# Patient Record
Sex: Female | Born: 1965 | Race: White | Hispanic: No | Smoking: Never smoker
Health system: Southern US, Community
[De-identification: ages and names within clinical notes are randomized; demographics above are authoritative.]

## PROBLEM LIST (undated history)

## (undated) DIAGNOSIS — F419 Anxiety disorder, unspecified: Secondary | ICD-10-CM

## (undated) DIAGNOSIS — F32A Depression, unspecified: Secondary | ICD-10-CM

## (undated) DIAGNOSIS — K219 Gastro-esophageal reflux disease without esophagitis: Secondary | ICD-10-CM

## (undated) DIAGNOSIS — Z9889 Other specified postprocedural states: Secondary | ICD-10-CM

## (undated) DIAGNOSIS — R51 Headache: Secondary | ICD-10-CM

## (undated) DIAGNOSIS — R112 Nausea with vomiting, unspecified: Secondary | ICD-10-CM

## (undated) DIAGNOSIS — M4810 Ankylosing hyperostosis [Forestier], site unspecified: Secondary | ICD-10-CM

## (undated) DIAGNOSIS — I10 Essential (primary) hypertension: Secondary | ICD-10-CM

## (undated) DIAGNOSIS — H04123 Dry eye syndrome of bilateral lacrimal glands: Secondary | ICD-10-CM

## (undated) DIAGNOSIS — M542 Cervicalgia: Secondary | ICD-10-CM

## (undated) DIAGNOSIS — Z8619 Personal history of other infectious and parasitic diseases: Secondary | ICD-10-CM

## (undated) DIAGNOSIS — M1990 Unspecified osteoarthritis, unspecified site: Secondary | ICD-10-CM

## (undated) DIAGNOSIS — M4802 Spinal stenosis, cervical region: Secondary | ICD-10-CM

## (undated) DIAGNOSIS — F329 Major depressive disorder, single episode, unspecified: Secondary | ICD-10-CM

## (undated) DIAGNOSIS — N3941 Urge incontinence: Secondary | ICD-10-CM

## (undated) DIAGNOSIS — R011 Cardiac murmur, unspecified: Secondary | ICD-10-CM

## (undated) DIAGNOSIS — J189 Pneumonia, unspecified organism: Secondary | ICD-10-CM

## (undated) DIAGNOSIS — S1121XA Laceration without foreign body of pharynx and cervical esophagus, initial encounter: Secondary | ICD-10-CM

## (undated) HISTORY — DX: Urge incontinence: N39.41

## (undated) HISTORY — DX: Personal history of other infectious and parasitic diseases: Z86.19

## (undated) HISTORY — DX: Laceration without foreign body of pharynx and cervical esophagus, initial encounter: S11.21XA

## (undated) HISTORY — DX: Ankylosing hyperostosis (forestier), site unspecified: M48.10

## (undated) HISTORY — DX: Spinal stenosis, cervical region: M48.02

## (undated) HISTORY — DX: Pneumonia, unspecified organism: J18.9

---

## 2000-06-16 ENCOUNTER — Other Ambulatory Visit: Admission: RE | Admit: 2000-06-16 | Discharge: 2000-06-16 | Payer: Self-pay | Admitting: Obstetrics and Gynecology

## 2001-04-20 ENCOUNTER — Ambulatory Visit (HOSPITAL_COMMUNITY): Admission: RE | Admit: 2001-04-20 | Discharge: 2001-04-20 | Payer: Self-pay | Admitting: Obstetrics and Gynecology

## 2001-04-20 ENCOUNTER — Encounter: Payer: Self-pay | Admitting: Obstetrics and Gynecology

## 2001-08-31 ENCOUNTER — Encounter (HOSPITAL_COMMUNITY): Admission: AD | Admit: 2001-08-31 | Discharge: 2001-09-07 | Payer: Self-pay | Admitting: Obstetrics and Gynecology

## 2001-09-07 ENCOUNTER — Inpatient Hospital Stay (HOSPITAL_COMMUNITY): Admission: AD | Admit: 2001-09-07 | Discharge: 2001-09-09 | Payer: Self-pay | Admitting: Obstetrics and Gynecology

## 2009-04-25 HISTORY — PX: ABDOMINAL HYSTERECTOMY: SHX81

## 2012-04-25 DIAGNOSIS — R112 Nausea with vomiting, unspecified: Secondary | ICD-10-CM

## 2012-04-25 DIAGNOSIS — Z9889 Other specified postprocedural states: Secondary | ICD-10-CM

## 2012-04-25 HISTORY — DX: Other specified postprocedural states: Z98.890

## 2012-04-25 HISTORY — DX: Other specified postprocedural states: R11.2

## 2012-08-13 ENCOUNTER — Other Ambulatory Visit (HOSPITAL_COMMUNITY): Payer: Self-pay | Admitting: Specialist

## 2012-08-16 ENCOUNTER — Encounter (HOSPITAL_COMMUNITY): Payer: Self-pay | Admitting: Pharmacy Technician

## 2012-08-17 NOTE — H&P (Signed)
Carolyn [name] is an 47 y.o. female.   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 HPI: Pt with increasing difficulty with stiffness of neck over the past few years.  Recently developed dysphagia . Pt was evaluated by her PCP and an ENT physician for her dysphagia which is worsening.  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  She has limited range of motion of the neck.  Denies upper extremity weakness, numbness or tingling.  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  Radiographs show straightening of the cervical spine with good maintenance of the disc space.  Large bridging coalescing syndesmophytes extending from the anteroinferior aspect of C2 bridging to C3, bridging to C4, bridging to C5 and then bridging to C6.  The most prominent spurs appear to be at C2-3 where there is impingement on the retropharyngeal space.     Past Medical History  Diagnosis Date  . Hypertension     takes Hyzaar daily  . Neck pain     bone spur on esophagus  . Headache     MRI in 2007 bc of headaches  . Arthritis   . Urinary frequency   . Heart murmur     New diagnosis  . Dry eyes     uses Restasis bid  . Depression     but doesn't require any meds    Past Surgical History  Procedure Laterality Date  . Abdominal hysterectomy  2011    History reviewed. No pertinent family history. Social History:  reports that she has never smoked. She does not have any smokeless tobacco history on file. She reports that  drinks alcohol. She reports that she does not use illicit drugs.  Allergies: No Known Allergies  Medications Prior to Admission  Medication Sig Dispense Refill  . cycloSPORINE (RESTASIS) 0.05 % ophthalmic emulsion Place 1 drop into both eyes 2 (two) times daily.      [name] losartan-hydrochlorothiazide  (HYZAAR) 100-25 MG per tablet Take 1 tablet by mouth daily.        Results for orders placed during the hospital encounter of 08/20/12 (from the past 48 hour(s))  SURGICAL PCR SCREEN     Status: Abnormal   Collection Time    08/20/12  8:17 AM      Result Value Range   MRSA, PCR NEGATIVE  NEGATIVE   Staphylococcus aureus POSITIVE (*) NEGATIVE   Comment:            The Xpert SA Assay (FDA     approved for NASAL specimens     in patients over 24 years of age),     is one component of     a comprehensive surveillance     program.  Test performance has     been validated by [employer] for patients greater     than or equal to 91 year old.     It is not intended     to diagnose infection nor to     guide or monitor treatment.  URINALYSIS, ROUTINE W REFLEX MICROSCOPIC     Status: None   Collection Time    08/20/12  8:18 AM      Result Value Range   Color, Urine YELLOW  YELLOW   APPearance CLEAR  CLEAR  Specific Gravity, Urine 1.021  1.005 - 1.030   pH 6.5  5.0 - 8.0   Glucose, UA NEGATIVE  NEGATIVE mg/dL   Hgb urine dipstick NEGATIVE  NEGATIVE   Bilirubin Urine NEGATIVE  NEGATIVE   Ketones, ur NEGATIVE  NEGATIVE mg/dL   Protein, ur NEGATIVE  NEGATIVE mg/dL   Urobilinogen, UA 0.2  0.0 - 1.0 mg/dL   Nitrite NEGATIVE  NEGATIVE   Leukocytes, UA NEGATIVE  NEGATIVE   Comment: MICROSCOPIC NOT DONE ON URINES WITH NEGATIVE PROTEIN, BLOOD, LEUKOCYTES, NITRITE, OR GLUCOSE <1000 mg/dL.  CBC     Status: None   Collection Time    08/20/12  8:19 AM      Result Value Range   WBC 9.2  4.0 - 10.5 K/uL   RBC 4.54  3.87 - 5.11 MIL/uL   Hemoglobin 13.7  12.0 - 15.0 g/dL   HCT 45.4  09.8 - 11.9 %   MCV 84.1  78.0 - 100.0 fL   MCH 30.2  26.0 - 34.0 pg   MCHC 35.9  30.0 - 36.0 g/dL   RDW 14.7  82.9 - 56.2 %   Platelets 230  150 - 400 K/uL  COMPREHENSIVE METABOLIC PANEL     Status: Abnormal   Collection Time    08/20/12  8:19 AM      Result Value Range   Sodium 138  135 - 145 mEq/L    Potassium 3.3 (*) 3.5 - 5.1 mEq/L   Chloride 99  96 - 112 mEq/L   CO2 30  19 - 32 mEq/L   Glucose, Bld 98  70 - 99 mg/dL   BUN 17  6 - 23 mg/dL   Creatinine, Ser 1.30  0.50 - 1.10 mg/dL   Calcium 9.7  8.4 - 86.5 mg/dL   Total Protein 7.8  6.0 - 8.3 g/dL   Albumin 4.4  3.5 - 5.2 g/dL   AST 14  0 - 37 U/L   ALT 14  0 - 35 U/L   Alkaline Phosphatase 64  39 - 117 U/L   Total Bilirubin 0.7  0.3 - 1.2 mg/dL   GFR calc non Af Amer >90  >90 mL/min   GFR calc Af Amer >90  >90 mL/min   Comment:            The eGFR has been calculated     using the CKD EPI equation.     This calculation has not been     validated in all clinical     situations.     eGFR's persistently     <90 mL/min signify     possible Chronic Kidney Disease.   Dg Chest 2 View  08/20/2012  *RADIOLOGY REPORT*  Clinical Data: Hypertension  CHEST - 2 VIEW  Comparison: None.  Findings: Cardiomediastinal silhouette appears normal.  No acute pulmonary disease is noted.  Bony thorax is intact.  IMPRESSION: No acute cardiopulmonary abnormality seen.   Original Report Authenticated By: Lupita Raider.,  M.D.     Review of Systems  Constitutional: Negative.   HENT: Positive for neck pain.   Eyes: Negative.   Respiratory: Negative.   Cardiovascular: Negative.   Gastrointestinal:       Dysphagia  Genitourinary: Negative.   Musculoskeletal:       Stiffness of neck and very limited range of motion  Skin: Negative.   Neurological: Negative.   Endo/Heme/Allergies: Negative.   Psychiatric/Behavioral: Negative.     Blood pressure 132/74, pulse 62,  temperature 98.2 F (36.8 C), temperature source Oral, resp. rate 20, SpO2 100.00%. Physical Exam  Constitutional: She is oriented to person, place, and time. She appears well-developed and well-nourished.  HENT:  Head: Normocephalic and atraumatic.  Eyes: EOM are normal. Pupils are equal, round, and reactive to light.  Neck:  Decreased ROM of neck with lateral bending and  rotation diminished by 60%, extension decreased to 30% of normal.    Cardiovascular: Normal rate, regular rhythm and intact distal pulses.   Murmur heard. Grade II/IV systolic murmur heard at left sternal border.  No radiation into neck .  No bruit of carotid arteries.  Respiratory: Effort normal and breath sounds normal.  GI: Soft. Bowel sounds are normal.  Musculoskeletal:  Slight weakness of shoulder abduction left > right and no other focal weakness of upper or lower extremities.  Neurological: She is alert and oriented to person, place, and time. She has normal reflexes.  Skin: Skin is warm and dry.  Psychiatric: She has a normal mood and affect.     Assessment/Plan Exostosis of Cervical spine, C2-C5 with dysphagia  PLAN:  Excision of exostosis C2-3, C3-4 and C4-5 through anterior cervical approach. Patient was seen and examined in the preop holding area. There has been no interval  Change in this patient's exam preop  history and physical exam  Lab tests and images have been examined and reviewed.  The Risks benefits and alternative treatments have been discussed  extensively,questions answered.  The patient has elected to undergo the discussed surgical treatment. VERNON,SHEILA M 08/21/2012, 9:58 AM

## 2012-08-17 NOTE — Pre-Procedure Instructions (Signed)
Carolyn [name]  08/17/2012   Your procedure is scheduled on:  Tues, April 29 @ 11:00 AM  Report to [facility] at 9:00 AM.  Call this number if you have problems the morning of surgery: [phone]   Remember:   Do not eat food or drink liquids after midnight.   Take these medicines the morning of surgery with A SIP OF WATER: NONE  Restasis (eye drop)   Do not wear jewelry, make-up or nail polish.  Do not wear lotions, powders, or perfumes.   Do not shave 48 hours prior to surgery.   Do not bring valuables to the hospital.  Contacts, dentures or bridgework may not be worn into surgery.  Leave suitcase in the car. After surgery it may be brought to your room.  For patients admitted to the hospital, checkout time is 11:00 AM the day of discharge.   Patients discharged the day of surgery will not be allowed to drive home.  Name and phone number of your driver: Family/Friend  Special Instructions: Please shower the night before your surgery 08/20/12 and the morning of your surgery 08/21/12. You should use approximately 1/3 of the bottle with each shower.    Please read over the following fact sheets that you were given: Pain Booklet, Coughing and Deep Breathing, MRSA Information and Surgical Site Infection Prevention

## 2012-08-20 ENCOUNTER — Encounter (HOSPITAL_COMMUNITY): Payer: Self-pay

## 2012-08-20 ENCOUNTER — Encounter (HOSPITAL_COMMUNITY)
Admission: RE | Admit: 2012-08-20 | Discharge: 2012-08-20 | Disposition: A | Discharge status: Home / Self Care | Payer: BC Managed Care – PPO | Source: Ambulatory Visit | Attending: Specialist | Admitting: Specialist

## 2012-08-20 HISTORY — DX: Depression, unspecified: F32.A

## 2012-08-20 HISTORY — DX: Major depressive disorder, single episode, unspecified: F32.9

## 2012-08-20 HISTORY — DX: Headache: R51

## 2012-08-20 HISTORY — DX: Cardiac murmur, unspecified: R01.1

## 2012-08-20 HISTORY — DX: Essential (primary) hypertension: I10

## 2012-08-20 HISTORY — DX: Unspecified osteoarthritis, unspecified site: M19.90

## 2012-08-20 HISTORY — DX: Dry eye syndrome of bilateral lacrimal glands: H04.123

## 2012-08-20 HISTORY — DX: Cervicalgia: M54.2

## 2012-08-20 LAB — COMPREHENSIVE METABOLIC PANEL
Albumin: 4.4 g/dL (ref 3.5–5.2)
BUN: 17 mg/dL (ref 6–23)
Calcium: 9.7 mg/dL (ref 8.4–10.5)
Chloride: 99 mEq/L (ref 96–112)
Creatinine, Ser: 0.6 mg/dL (ref 0.50–1.10)
Total Bilirubin: 0.7 mg/dL (ref 0.3–1.2)

## 2012-08-20 LAB — CBC
HCT: 38.2 % (ref 36.0–46.0)
MCH: 30.2 pg (ref 26.0–34.0)
MCHC: 35.9 g/dL (ref 30.0–36.0)
MCV: 84.1 fL (ref 78.0–100.0)
RDW: 13.1 % (ref 11.5–15.5)
WBC: 9.2 10*3/uL (ref 4.0–10.5)

## 2012-08-20 LAB — URINALYSIS, ROUTINE W REFLEX MICROSCOPIC
Glucose, UA: NEGATIVE mg/dL
Leukocytes, UA: NEGATIVE
Protein, ur: NEGATIVE mg/dL
Specific Gravity, Urine: 1.021 (ref 1.005–1.030)
Urobilinogen, UA: 0.2 mg/dL (ref 0.0–1.0)

## 2012-08-20 MED ORDER — CHLORHEXIDINE GLUCONATE 4 % EX LIQD: 60.0000 mL | Freq: Once | CUTANEOUS | Status: DC

## 2012-08-20 MED ORDER — CEFAZOLIN SODIUM-DEXTROSE 2-3 GM-% IV SOLR
2.0000 g | INTRAVENOUS | Status: AC
  Administered 2012-08-21: 1 g via INTRAVENOUS
  Administered 2012-08-21: 2 g via INTRAVENOUS
  Filled 2012-08-20: qty 50

## 2012-08-20 NOTE — Progress Notes (Signed)
Pt doesn't have a cardiologist  Denies ever having an echo/stress test/heart cath  Medical Md is Dr.Kevin Howard  EKG report in chart  Denies CXR in past yr  Dr.Nitka's office heard heart murmur on Mon and pressure was elevated-sent to medical MD;HCTZ was added to Losartan on Wed and Medical Md states that heart murmur was not noted in Nov at routine physical.Was told needs to have echo but couldn't get in before surgery

## 2012-08-20 NOTE — Progress Notes (Signed)
Spoke with Dr.Smith about pt new diagnosis of heart murmur and not able to get echo in prior to surgery;per Dr.Smith no new orders just have pt to follow up on echo being done after surgery

## 2012-08-21 ENCOUNTER — Inpatient Hospital Stay (HOSPITAL_COMMUNITY)
Admission: RE | Admit: 2012-08-21 | Discharge: 2012-08-22 | DRG: 867 | Disposition: A | Discharge status: Home / Self Care | Payer: BC Managed Care – PPO | Source: Ambulatory Visit | Attending: Specialist | Admitting: Specialist

## 2012-08-21 ENCOUNTER — Ambulatory Visit (HOSPITAL_COMMUNITY): Payer: BC Managed Care – PPO

## 2012-08-21 ENCOUNTER — Ambulatory Visit (HOSPITAL_COMMUNITY): Payer: BC Managed Care – PPO | Admitting: Anesthesiology

## 2012-08-21 ENCOUNTER — Encounter (HOSPITAL_COMMUNITY)
Admission: RE | Disposition: A | Discharge status: Home / Self Care | Payer: Self-pay | Source: Ambulatory Visit | Attending: Specialist

## 2012-08-21 ENCOUNTER — Encounter (HOSPITAL_COMMUNITY): Payer: Self-pay | Admitting: *Deleted

## 2012-08-21 ENCOUNTER — Encounter (HOSPITAL_COMMUNITY): Payer: Self-pay | Admitting: Anesthesiology

## 2012-08-21 DIAGNOSIS — F329 Major depressive disorder, single episode, unspecified: Secondary | ICD-10-CM | POA: Diagnosis present

## 2012-08-21 DIAGNOSIS — F3289 Other specified depressive episodes: Secondary | ICD-10-CM | POA: Diagnosis present

## 2012-08-21 DIAGNOSIS — R011 Cardiac murmur, unspecified: Secondary | ICD-10-CM | POA: Diagnosis present

## 2012-08-21 DIAGNOSIS — M47812 Spondylosis without myelopathy or radiculopathy, cervical region: Secondary | ICD-10-CM

## 2012-08-21 DIAGNOSIS — I10 Essential (primary) hypertension: Secondary | ICD-10-CM | POA: Diagnosis present

## 2012-08-21 DIAGNOSIS — M4810 Ankylosing hyperostosis [Forestier], site unspecified: Secondary | ICD-10-CM | POA: Diagnosis present

## 2012-08-21 DIAGNOSIS — R51 Headache: Secondary | ICD-10-CM | POA: Diagnosis present

## 2012-08-21 DIAGNOSIS — R1319 Other dysphagia: Secondary | ICD-10-CM | POA: Diagnosis present

## 2012-08-21 DIAGNOSIS — M5380 Other specified dorsopathies, site unspecified: Principal | ICD-10-CM | POA: Diagnosis present

## 2012-08-21 HISTORY — PX: ANTERIOR CERVICAL DECOMP/DISCECTOMY FUSION: SHX1161

## 2012-08-21 SURGERY — ANTERIOR CERVICAL DECOMPRESSION/DISCECTOMY FUSION 1 LEVEL
Anesthesia: General | Site: Neck | Wound class: Clean

## 2012-08-21 MED ORDER — KCL IN DEXTROSE-NACL 20-5-0.45 MEQ/L-%-% IV SOLN
INTRAVENOUS | Status: DC
  Administered 2012-08-21: 19:00:00 via INTRAVENOUS
  Filled 2012-08-21 (×3): qty 1000

## 2012-08-21 MED ORDER — HYDROCHLOROTHIAZIDE 25 MG PO TABS
25.0000 mg | ORAL_TABLET | Freq: Every day | ORAL | Status: DC
  Filled 2012-08-21: qty 1

## 2012-08-21 MED ORDER — ZOLPIDEM TARTRATE 5 MG PO TABS: 5.0000 mg | ORAL_TABLET | Freq: Every evening | ORAL | Status: DC | PRN

## 2012-08-21 MED ORDER — ROCURONIUM BROMIDE 100 MG/10ML IV SOLN
INTRAVENOUS | Status: DC | PRN
  Administered 2012-08-21 (×2): 20 mg via INTRAVENOUS
  Administered 2012-08-21: 10 mg via INTRAVENOUS
  Administered 2012-08-21: 50 mg via INTRAVENOUS
  Administered 2012-08-21: 20 mg via INTRAVENOUS

## 2012-08-21 MED ORDER — DEXAMETHASONE SODIUM PHOSPHATE 4 MG/ML IJ SOLN
INTRAMUSCULAR | Status: DC | PRN
  Administered 2012-08-21: 8 mg via INTRAVENOUS

## 2012-08-21 MED ORDER — OXYCODONE HCL 5 MG PO TABS: 5.0000 mg | ORAL_TABLET | Freq: Once | ORAL | Status: DC | PRN

## 2012-08-21 MED ORDER — MIDAZOLAM HCL 2 MG/2ML IJ SOLN: 0.5000 mg | Freq: Once | INTRAMUSCULAR | Status: DC | PRN

## 2012-08-21 MED ORDER — GLYCOPYRROLATE 0.2 MG/ML IJ SOLN
INTRAMUSCULAR | Status: DC | PRN
  Administered 2012-08-21: 0.4 mg via INTRAVENOUS

## 2012-08-21 MED ORDER — LOSARTAN POTASSIUM 50 MG PO TABS
100.0000 mg | ORAL_TABLET | Freq: Every day | ORAL | Status: DC
  Filled 2012-08-21: qty 2

## 2012-08-21 MED ORDER — CYCLOSPORINE 0.05 % OP EMUL
1.0000 [drp] | Freq: Two times a day (BID) | OPHTHALMIC | Status: DC
  Administered 2012-08-21: 1 [drp] via OPHTHALMIC
  Filled 2012-08-21 (×3): qty 1

## 2012-08-21 MED ORDER — BISACODYL 10 MG RE SUPP: 10.0000 mg | Freq: Every day | RECTAL | Status: DC | PRN

## 2012-08-21 MED ORDER — OXYCODONE HCL 5 MG/5ML PO SOLN: 5.0000 mg | Freq: Once | ORAL | Status: DC | PRN

## 2012-08-21 MED ORDER — SODIUM CHLORIDE 0.9 % IJ SOLN: 3.0000 mL | INTRAMUSCULAR | Status: DC | PRN

## 2012-08-21 MED ORDER — ACETAMINOPHEN 10 MG/ML IV SOLN
1000.0000 mg | Freq: Once | INTRAVENOUS | Status: DC
  Filled 2012-08-21: qty 100

## 2012-08-21 MED ORDER — SODIUM CHLORIDE 0.9 % IV SOLN: 250.0000 mL | INTRAVENOUS | Status: DC

## 2012-08-21 MED ORDER — NEOSTIGMINE METHYLSULFATE 1 MG/ML IJ SOLN
INTRAMUSCULAR | Status: DC | PRN
  Administered 2012-08-21: 3 mg via INTRAVENOUS

## 2012-08-21 MED ORDER — FLEET ENEMA 7-19 GM/118ML RE ENEM: 1.0000 | ENEMA | Freq: Once | RECTAL | Status: AC | PRN

## 2012-08-21 MED ORDER — PANTOPRAZOLE SODIUM 40 MG IV SOLR
40.0000 mg | Freq: Every day | INTRAVENOUS | Status: DC
  Administered 2012-08-21: 40 mg via INTRAVENOUS
  Filled 2012-08-21 (×2): qty 40

## 2012-08-21 MED ORDER — SENNOSIDES-DOCUSATE SODIUM 8.6-50 MG PO TABS: 1.0000 | ORAL_TABLET | Freq: Every evening | ORAL | Status: DC | PRN

## 2012-08-21 MED ORDER — MEPERIDINE HCL 25 MG/ML IJ SOLN: 6.2500 mg | INTRAMUSCULAR | Status: DC | PRN

## 2012-08-21 MED ORDER — FENTANYL CITRATE 0.05 MG/ML IJ SOLN
INTRAMUSCULAR | Status: DC | PRN
  Administered 2012-08-21: 250 ug via INTRAVENOUS
  Administered 2012-08-21: 50 ug via INTRAVENOUS
  Administered 2012-08-21 (×2): 100 ug via INTRAVENOUS

## 2012-08-21 MED ORDER — MIDAZOLAM HCL 2 MG/2ML IJ SOLN
INTRAMUSCULAR | Status: AC
  Filled 2012-08-21: qty 2

## 2012-08-21 MED ORDER — ACETAMINOPHEN 325 MG PO TABS: 650.0000 mg | ORAL_TABLET | ORAL | Status: DC | PRN

## 2012-08-21 MED ORDER — PROPOFOL 10 MG/ML IV BOLUS
INTRAVENOUS | Status: DC | PRN
  Administered 2012-08-21: 150 mg via INTRAVENOUS
  Administered 2012-08-21: 50 mg via INTRAVENOUS

## 2012-08-21 MED ORDER — LIDOCAINE HCL (CARDIAC) 20 MG/ML IV SOLN
INTRAVENOUS | Status: DC | PRN
  Administered 2012-08-21: 100 mg via INTRAVENOUS

## 2012-08-21 MED ORDER — HYDROMORPHONE HCL PF 1 MG/ML IJ SOLN
0.5000 mg | INTRAMUSCULAR | Status: DC | PRN
  Administered 2012-08-22: 1 mg via INTRAVENOUS
  Filled 2012-08-21: qty 1

## 2012-08-21 MED ORDER — EPHEDRINE SULFATE 50 MG/ML IJ SOLN
INTRAMUSCULAR | Status: DC | PRN
  Administered 2012-08-21: 10 mg via INTRAVENOUS

## 2012-08-21 MED ORDER — CEFAZOLIN SODIUM 1-5 GM-% IV SOLN
INTRAVENOUS | Status: AC
  Filled 2012-08-21: qty 50

## 2012-08-21 MED ORDER — ONDANSETRON HCL 4 MG/2ML IJ SOLN: 4.0000 mg | INTRAMUSCULAR | Status: DC | PRN

## 2012-08-21 MED ORDER — OXYCODONE-ACETAMINOPHEN 5-325 MG PO TABS
1.0000 | ORAL_TABLET | ORAL | Status: DC | PRN
Start: 2012-08-21 — End: 2012-08-22

## 2012-08-21 MED ORDER — LACTATED RINGERS IV SOLN
INTRAVENOUS | Status: DC
  Administered 2012-08-21 (×2): via INTRAVENOUS

## 2012-08-21 MED ORDER — BUPIVACAINE-EPINEPHRINE 0.5% -1:200000 IJ SOLN
INTRAMUSCULAR | Status: DC | PRN
  Administered 2012-08-21: 10 mL

## 2012-08-21 MED ORDER — MIDAZOLAM HCL 5 MG/5ML IJ SOLN
INTRAMUSCULAR | Status: DC | PRN
  Administered 2012-08-21: 2 mg via INTRAVENOUS

## 2012-08-21 MED ORDER — ONDANSETRON HCL 4 MG/2ML IJ SOLN
INTRAMUSCULAR | Status: DC | PRN
  Administered 2012-08-21: 4 mg via INTRAVENOUS

## 2012-08-21 MED ORDER — THROMBIN 20000 UNITS EX SOLR
OROMUCOSAL | Status: DC | PRN
  Administered 2012-08-21: 12:00:00 via TOPICAL

## 2012-08-21 MED ORDER — HYDROCODONE-ACETAMINOPHEN 5-325 MG PO TABS: 1.0000 | ORAL_TABLET | ORAL | Status: DC | PRN

## 2012-08-21 MED ORDER — LIDOCAINE VISCOUS 2 % MT SOLN
20.0000 mL | OROMUCOSAL | Status: DC | PRN
  Filled 2012-08-21: qty 20

## 2012-08-21 MED ORDER — METHOCARBAMOL 100 MG/ML IJ SOLN
500.0000 mg | Freq: Four times a day (QID) | INTRAVENOUS | Status: DC | PRN
  Filled 2012-08-21: qty 5

## 2012-08-21 MED ORDER — CEFAZOLIN SODIUM 1-5 GM-% IV SOLN
1.0000 g | Freq: Three times a day (TID) | INTRAVENOUS | Status: AC
  Administered 2012-08-21 – 2012-08-22 (×2): 1 g via INTRAVENOUS
  Filled 2012-08-21 (×2): qty 50

## 2012-08-21 MED ORDER — PROMETHAZINE HCL 25 MG/ML IJ SOLN
6.2500 mg | INTRAMUSCULAR | Status: DC | PRN
  Administered 2012-08-21: 12.5 mg via INTRAVENOUS

## 2012-08-21 MED ORDER — ACETAMINOPHEN 650 MG RE SUPP: 650.0000 mg | RECTAL | Status: DC | PRN

## 2012-08-21 MED ORDER — DOCUSATE SODIUM 100 MG PO CAPS
100.0000 mg | ORAL_CAPSULE | Freq: Two times a day (BID) | ORAL | Status: DC
  Administered 2012-08-21: 100 mg via ORAL
  Filled 2012-08-21 (×2): qty 1

## 2012-08-21 MED ORDER — KETOROLAC TROMETHAMINE 30 MG/ML IJ SOLN
30.0000 mg | Freq: Four times a day (QID) | INTRAMUSCULAR | Status: DC
  Administered 2012-08-21 – 2012-08-22 (×3): 30 mg via INTRAVENOUS
  Filled 2012-08-21 (×4): qty 1

## 2012-08-21 MED ORDER — HYDROMORPHONE HCL PF 1 MG/ML IJ SOLN: 0.2500 mg | INTRAMUSCULAR | Status: DC | PRN

## 2012-08-21 MED ORDER — INDIGOTINDISULFONATE SODIUM 8 MG/ML IJ SOLN
INTRAMUSCULAR | Status: AC
  Filled 2012-08-21: qty 5

## 2012-08-21 MED ORDER — PROMETHAZINE HCL 25 MG/ML IJ SOLN
INTRAMUSCULAR | Status: AC
  Filled 2012-08-21: qty 1

## 2012-08-21 MED ORDER — HYDROMORPHONE HCL PF 1 MG/ML IJ SOLN
INTRAMUSCULAR | Status: AC
  Filled 2012-08-21: qty 2

## 2012-08-21 MED ORDER — ARTIFICIAL TEARS OP OINT
TOPICAL_OINTMENT | OPHTHALMIC | Status: DC | PRN
  Administered 2012-08-21: 1 via OPHTHALMIC

## 2012-08-21 MED ORDER — METHOCARBAMOL 500 MG PO TABS: 500.0000 mg | ORAL_TABLET | Freq: Four times a day (QID) | ORAL | Status: DC | PRN

## 2012-08-21 MED ORDER — MENTHOL 3 MG MT LOZG: 1.0000 | LOZENGE | OROMUCOSAL | Status: DC | PRN

## 2012-08-21 MED ORDER — PHENOL 1.4 % MT LIQD: 1.0000 | OROMUCOSAL | Status: DC | PRN

## 2012-08-21 MED ORDER — LOSARTAN POTASSIUM-HCTZ 100-25 MG PO TABS: 1.0000 | ORAL_TABLET | Freq: Every day | ORAL | Status: DC

## 2012-08-21 MED ORDER — SODIUM CHLORIDE 0.9 % IJ SOLN: 3.0000 mL | Freq: Two times a day (BID) | INTRAMUSCULAR | Status: DC

## 2012-08-21 MED ORDER — BUPIVACAINE-EPINEPHRINE PF 0.5-1:200000 % IJ SOLN
INTRAMUSCULAR | Status: AC
  Filled 2012-08-21: qty 30

## 2012-08-21 SURGICAL SUPPLY — 62 items
ADH SKN CLS APL DERMABOND .7 (GAUZE/BANDAGES/DRESSINGS) ×1
ADH SKN CLS LQ APL DERMABOND (GAUZE/BANDAGES/DRESSINGS) ×1
BLADE SURG ROTATE 9660 (MISCELLANEOUS) IMPLANT
BUR ROUND FLUTED 4 SOFT TCH (BURR) ×2 IMPLANT
BUR SABER RD CUTTING 3.0 (BURR) ×2 IMPLANT
CLOTH BEACON ORANGE TIMEOUT ST (SAFETY) ×2 IMPLANT
CORDS BIPOLAR (ELECTRODE) ×2 IMPLANT
COVER SURGICAL LIGHT HANDLE (MISCELLANEOUS) ×2 IMPLANT
DERMABOND ADHESIVE PROPEN (GAUZE/BANDAGES/DRESSINGS) ×1
DERMABOND ADVANCED (GAUZE/BANDAGES/DRESSINGS) ×1
DERMABOND ADVANCED .7 DNX12 (GAUZE/BANDAGES/DRESSINGS) ×1 IMPLANT
DERMABOND ADVANCED .7 DNX6 (GAUZE/BANDAGES/DRESSINGS) IMPLANT
DRAIN TLS ROUND 10FR (DRAIN) IMPLANT
DRAPE C-ARM 42X72 X-RAY (DRAPES) ×2 IMPLANT
DRAPE MICROSCOPE LEICA (MISCELLANEOUS) ×2 IMPLANT
DRAPE POUCH INSTRU U-SHP 10X18 (DRAPES) ×2 IMPLANT
DRAPE SURG 17X23 STRL (DRAPES) ×6 IMPLANT
DRSG MEPILEX BORDER 4X4 (GAUZE/BANDAGES/DRESSINGS) ×1 IMPLANT
DURAPREP 6ML APPLICATOR 50/CS (WOUND CARE) ×2 IMPLANT
ELECT BLADE 4.0 EZ CLEAN MEGAD (MISCELLANEOUS) ×2
ELECT COATED BLADE 2.86 ST (ELECTRODE) ×2 IMPLANT
ELECT REM PT RETURN 9FT ADLT (ELECTROSURGICAL) ×2
ELECTRODE BLDE 4.0 EZ CLN MEGD (MISCELLANEOUS) IMPLANT
ELECTRODE REM PT RTRN 9FT ADLT (ELECTROSURGICAL) ×1 IMPLANT
GLOVE BIOGEL PI IND STRL 6.5 (GLOVE) IMPLANT
GLOVE BIOGEL PI IND STRL 7.0 (GLOVE) IMPLANT
GLOVE BIOGEL PI IND STRL 7.5 (GLOVE) ×1 IMPLANT
GLOVE BIOGEL PI INDICATOR 6.5 (GLOVE) ×2
GLOVE BIOGEL PI INDICATOR 7.0 (GLOVE) ×1
GLOVE BIOGEL PI INDICATOR 7.5 (GLOVE) ×1
GLOVE ECLIPSE 7.0 STRL STRAW (GLOVE) ×2 IMPLANT
GLOVE ECLIPSE 8.5 STRL (GLOVE) ×2 IMPLANT
GLOVE SURG 8.5 LATEX PF (GLOVE) ×2 IMPLANT
GLOVE SURG SS PI 6.5 STRL IVOR (GLOVE) ×1 IMPLANT
GOWN PREVENTION PLUS LG XLONG (DISPOSABLE) ×1 IMPLANT
GOWN PREVENTION PLUS XXLARGE (GOWN DISPOSABLE) ×2 IMPLANT
GOWN STRL NON-REIN LRG LVL3 (GOWN DISPOSABLE) ×4 IMPLANT
HEAD HALTER (SOFTGOODS) ×2 IMPLANT
KIT BASIN OR (CUSTOM PROCEDURE TRAY) ×2 IMPLANT
KIT ROOM TURNOVER OR (KITS) ×2 IMPLANT
MANIFOLD NEPTUNE II (INSTRUMENTS) ×2 IMPLANT
NDL SPNL 20GX3.5 QUINCKE YW (NEEDLE) ×2 IMPLANT
NEEDLE SPNL 20GX3.5 QUINCKE YW (NEEDLE) ×4 IMPLANT
NS IRRIG 1000ML POUR BTL (IV SOLUTION) ×2 IMPLANT
PACK ORTHO CERVICAL (CUSTOM PROCEDURE TRAY) ×2 IMPLANT
PAD ARMBOARD 7.5X6 YLW CONV (MISCELLANEOUS) ×4 IMPLANT
PATTIES SURGICAL .5 X.5 (GAUZE/BANDAGES/DRESSINGS) ×1 IMPLANT
PIN DISTRACTION 14MM (PIN) ×2 IMPLANT
SPONGE INTESTINAL PEANUT (DISPOSABLE) IMPLANT
SPONGE LAP 4X18 X RAY DECT (DISPOSABLE) IMPLANT
SPONGE SURGIFOAM ABS GEL 100 (HEMOSTASIS) ×1 IMPLANT
SUT BONE WAX W31G (SUTURE) ×1 IMPLANT
SUT VIC AB 2-0 CT1 27 (SUTURE) ×2
SUT VIC AB 2-0 CT1 TAPERPNT 27 (SUTURE) ×1 IMPLANT
SUT VIC AB 3-0 FS2 27 (SUTURE) IMPLANT
SUT VICRYL 4-0 PS2 18IN ABS (SUTURE) ×2 IMPLANT
SYR 30ML LL (SYRINGE) ×2 IMPLANT
SYSTEM CHEST DRAIN TLS 7FR (DRAIN) IMPLANT
TOWEL OR 17X24 6PK STRL BLUE (TOWEL DISPOSABLE) ×2 IMPLANT
TOWEL OR 17X26 10 PK STRL BLUE (TOWEL DISPOSABLE) ×2 IMPLANT
TRAY FOLEY CATH 14FR (SET/KITS/TRAYS/PACK) IMPLANT
WATER STERILE IRR 1000ML POUR (IV SOLUTION) ×2 IMPLANT

## 2012-08-21 NOTE — Transfer of Care (Signed)
Immediate Anesthesia Transfer of Care Note  Patient: Carolyn [name]  Procedure(s) Performed: Procedure(s): Excision of exostosis C2-3,C3-4, C4-5 (N/A)  Patient Location: PACU  Anesthesia Type:General  Level of Consciousness: awake, alert  and oriented  Airway & Oxygen Therapy: Patient Spontanous Breathing and Patient connected to nasal cannula oxygen  Post-op Assessment: Report given to PACU RN, Post -op Vital signs reviewed and stable, Patient moving all extremities X 4 and Patient able to stick tongue midline  Post vital signs: Reviewed and stable  Complications: No apparent anesthesia complications

## 2012-08-21 NOTE — Brief Op Note (Signed)
08/21/2012  2:46 PM  PATIENT:  Carolyn [name]  47 y.o. female  PRE-OPERATIVE DIAGNOSIS:  Exostosis C2-C5 with Dysphagia, Forestier's disease.  POST-OPERATIVE DIAGNOSIS:  Exostosis C2-C5 with Dysphagia, Forestier's disease with impression on the posterior esophagus and obstructive dysphagia.  PROCEDURE:  Procedure(s): Excision of exostosis C2-3,C3-4, C4-5 (N/A)  SURGEON:  Surgeon(s) and Role: Kerrin Champagne, MD - Primary  PHYSICIAN ASSISTANT: Ermalene Postin  ANESTHESIA:   local and general  EBL:  Total I/O In: 1300 [I.V.:1300] Out: 250 [Blood:250]  BLOOD ADMINISTERED:none  DRAINS: Urinary Catheter (Foley) and (One 10 Fr) TLS Drain(s) to suction in the anterior left neck.   LOCAL MEDICATIONS USED:  MARCAINE    and Amount: 10 ml  SPECIMEN:  No Specimen  DISPOSITION OF SPECIMEN:  N/A  COUNTS:  YES  TOURNIQUET:  * No tourniquets in log *  DICTATION: .Dragon Dictation  PLAN OF CARE: Admit to inpatient   PATIENT DISPOSITION:  PACU - hemodynamically stable.   Delay start of Pharmacological VTE agent (>24hrs) due to surgical blood loss or risk of bleeding: yes

## 2012-08-21 NOTE — Anesthesia Postprocedure Evaluation (Signed)
  Anesthesia Post-op Note  Patient: Carolyn [name]  Procedure(s) Performed: Procedure(s): Excision of exostosis C2-3,C3-4, C4-5 (N/A)  Patient Location: PACU  Anesthesia Type:General  Level of Consciousness: awake, alert , oriented and patient cooperative  Airway and Oxygen Therapy: Patient Spontanous Breathing and Patient connected to nasal cannula oxygen  Post-op Pain: mild  Post-op Assessment: Post-op Vital signs reviewed, Patient's Cardiovascular Status Stable, Respiratory Function Stable, Patent Airway, No signs of Nausea or vomiting and Pain level controlled  Post-op Vital Signs: Reviewed and stable  Complications: No apparent anesthesia complications

## 2012-08-21 NOTE — Op Note (Signed)
08/21/2012  2:52 PM  PATIENT:  Carolyn [name]  47 y.o. female  MRN: [id number]  OPERATIVE REPORT  PRE-OPERATIVE DIAGNOSIS:  Exostosis C2-C5 with Dysphagia,  Due to Forestier's disease.  POST-OPERATIVE DIAGNOSIS:  Exostosis C2-C5 (3 levels) with Dysphagia, with impression on the posterior cervical esophagus.  PROCEDURE:  Procedure(s): Excision of exostosis C2-3,C3-4, C4-5 via anterior cervical exposure.    SURGEON:  Kerrin Champagne, MD     ASSISTANT:  Maud Deed, PA-C  (Present throughout the entire procedure and necessary for completion of procedure in a timely manner)     ANESTHESIA:  General supplemented with Marcaine 1/2 % with 1/200,000.      PROCEDURE:The patient was met in the holding area, and the appropriate Left cervical C2-3 C3-4 and C4-5 level identified and marked with an "X" and my initials. The patient was then transported to OR and was placed on the operative table in a supine position. The patient was then placed under  general anesthesia without difficulty. The patient received appropriate preoperative antibiotic prophylaxis one gram ancef.  . Nursing staff inserted a Foley catheter under sterile conditions cervical spine was positioned with a Mayfield horseshoe and 5 pound cervical halter traction. All pressure points well padded and semi-beach chair position. Arm holder both arms. Standard prep with DuraPrep solution the anterior cervical spine chest. Draped in the usual manner. Iodine vi drape was used. Standard timeout protocol was carried out identifying the patient procedure side of the procedure and level. The skin the left neck was infiltrated with Marcaine with epinephrine total of 10 cc. This at the level of expected C3-4 incision and also along the  Lines of Langer. Incision transverse at the C3-4 level 6 cm in length and carried down to the level of the platysma and then medial to sternocleidomastoid muscle. The interval between the trachea and esophagus medially  and the carotid sheath laterally was developed as a Metzenbaum scissors and blunt dissection exposing the anterior aspect of cervical spine at the C2-3 C3-4 and C4-5 level. Attention then turned to the C3-4  where an 18-gauge spinal needle was placed with sheath intact allowing only a centimeter to extend into the C3-4  observed on lateral xray at the superior anterior aspect of C4 level. Handheld Cloward retraction of the soft tissues while identifying the level at C4-5 and C3-4. Removing a portion of the anterior aspect of the calcified osteophytes at the C4 and C4-5 level.A high speed 4 mm cutting burr used to debride the coalescing anterior osteophytes, checking with intermittant C-arm spot views. Medial border of the longus collie muscles was carefully elevated bilaterally and self-retaining retractors were introduced the foot of the blade beneath the medial border of the longus colli muscles. Soft tissue overlying the anterior borders of the  C3-4 and C4-5 level carefully debridement of soft tissue back to bony edges. Osteophytes were also resected using rongeur and kerrison.  Loupe magnification and headlight was used for resection resection. An additional plane was developed superior to the superior thyroid vesses to visualize the C2-3 level. Visualization of the upper cervical segments was difficult and required handheld retractors. Also the C-arm visualization required removal self-retaining retractors with each spot views and replacement of the retractors throughout the procedure. High-speed bur was used to carefully debride most of the anterior osteophytes carefully protecting the soft tissue structures with handheld retractors and boss McCollough retractors with the most narrow blade at the lower C2 level and at C2-3.  Anterior bone bleeding controlled  using some bone wax, excess bone wax was removed. Irrigation was carried out cervical incision site. Esophagus examined at the upper cervical exposure  site as well as through the lower cervical exposure level and found to be normal. Irrigation was again carried out there was no active bleeding present.Longitudinal halter traction was discontinued. Esophagus examined and the upper cervical level as well as at the lower cervical level and found to be normal.  Bone wax was then applied to the bleeding bone surfaces. The anterior cervical prevertebral area was then carefully examined and the bone debris and bone wax was debrided. Prior to closure an NG tube was passed to the cervical level and the esophagus and oropharyngeal areas were irrigated with Jetty Peeks. No sign of leakage noted within the cervical incision site or along the posterior and lateral esophagus. The incisions were then closed by approximating the deep subcutaneous layers the platysma layer with interrupted 2-0 Vicryl suture and the superficial fascia overlying the sternocleidomastoid muscle with interrupted 3-0 Vicryl sutures. The subcutaneous layers were approximated with interrupted 3-0 Vicryl sutures as were the superficial layers. The skin was closed with a running subcutaneous stitch of 4-0 Vicryl at  the operative C3-4 transverse incision. Skin was approximated with Dermabond. Mepilex bandage was applied. A [place] collar was then applied to the cervical spine. drapes were removed. Patient's or table was then returned to the flat position. At the end of the cervical spine surgery case all instrument and sponge counts were correct. Patient was then reactivated extubated and returned to recovery room in satisfactory condition.  Maud Deed PA-C perform the duties of assistant surgeon performing careful retraction of the esophagus and trachea during the initial exposure and careful suctioning of neural elements cervical nerve roots and cervical cord. He was present from the beginning of case to the end of the case and assisted in positioning of the patient in transfer the patient from  bed to the stretcher at the end of the case. She closed the incision on the platysma layer to the skin. Applied dressing.   NITKA,JAMES E 08/21/2012, 2:52 PM

## 2012-08-21 NOTE — Anesthesia Procedure Notes (Signed)
Procedure Name: Intubation Date/Time: 08/21/2012 11:00 AM Performed by: Marena Chancy Pre-anesthesia Checklist: Patient identified, Timeout performed, Emergency Drugs available, Suction available and Patient being monitored Patient Re-evaluated:Patient Re-evaluated prior to inductionOxygen Delivery Method: Circle system utilized Preoxygenation: Pre-oxygenation with 100% oxygen Intubation Type: IV induction Ventilation: Mask ventilation without difficulty Tube type: Oral Tube size: 7.5 mm Number of attempts: 1 Airway Equipment and Method: Patient positioned with wedge pillow and Video-laryngoscopy Placement Confirmation: positive ETCO2 and breath sounds checked- equal and bilateral Secured at: 21 cm Tube secured with: Tape Dental Injury: Teeth and Oropharynx as per pre-operative assessment  Difficulty Due To: Difficulty was anticipated and Difficult Airway- due to reduced neck mobility Future Recommendations: Recommend- induction with short-acting agent, and alternative techniques readily available

## 2012-08-21 NOTE — Progress Notes (Signed)
Orthopedic Tech Progress Note Patient Details:  Carolyn [name] 23-Jun-1965 [id number]  Ortho Devices Type of Ortho Device: Soft collar Ortho Device/Splint Location: neck Ortho Device/Splint Interventions: Ordered;Application;Adjustment   [name] 08/21/2012, 4:15 PM

## 2012-08-21 NOTE — Anesthesia Preprocedure Evaluation (Addendum)
Anesthesia Evaluation  Patient identified by MRN, date of birth, ID band Patient awake    Reviewed: Allergy & Precautions, H&P , NPO status , Patient's Chart, lab work & pertinent test results  History of Anesthesia Complications Negative for: history of anesthetic complications  Airway Mallampati: I TM Distance: >3 FB Neck ROM: Full    Dental  (+) Teeth Intact, Missing and Dental Advisory Given   Pulmonary neg pulmonary ROS,  breath sounds clear to auscultation  Pulmonary exam normal       Cardiovascular hypertension, + Valvular Problems/Murmurs (newly diagnosed) Rhythm:Regular Rate:Normal + Systolic murmurs (II/VI)    Neuro/Psych    GI/Hepatic negative GI ROS, Neg liver ROS,   Endo/Other  negative endocrine ROS  Renal/GU negative Renal ROS     Musculoskeletal   Abdominal   Peds  Hematology negative hematology ROS (+)   Anesthesia Other Findings   Reproductive/Obstetrics                           Anesthesia Physical Anesthesia Plan  ASA: II  Anesthesia Plan: General   Post-op Pain Management:    Induction: Intravenous  Airway Management Planned: Oral ETT and Video Laryngoscope Planned  Additional Equipment:   Intra-op Plan:   Post-operative Plan: Extubation in OR  Informed Consent: I have reviewed the patients History and Physical, chart, labs and discussed the procedure including the risks, benefits and alternatives for the proposed anesthesia with the patient or authorized representative who has indicated his/her understanding and acceptance.   Dental advisory given  Plan Discussed with: CRNA and Surgeon  Anesthesia Plan Comments: (Plan routine monitors, GETA with VideoGlide intubation)        Anesthesia Quick Evaluation

## 2012-08-21 NOTE — Preoperative (Signed)
Beta Blockers   Reason not to administer Beta Blockers:Not Applicable 

## 2012-08-21 NOTE — Progress Notes (Signed)
Orthopedic Tech Progress Note Patient Details:  Carolyn [name] 12/01/1965 [id number] Delivered philadelphia collar to nurse (OR 6). Ortho Devices Type of Ortho Device: [place] cervical collar Ortho Device/Splint Interventions: Other (comment)   Lesle Chris 08/21/2012, 11:01 AM

## 2012-08-22 ENCOUNTER — Encounter (HOSPITAL_COMMUNITY): Payer: Self-pay | Admitting: Specialist

## 2012-08-22 MED ORDER — HYDROCODONE-ACETAMINOPHEN 7.5-500 MG/15ML PO SOLN: 15.0000 mL | ORAL | Status: DC | PRN

## 2012-08-22 NOTE — Progress Notes (Signed)
Discharge instructions reviewed with patient and husband. All questions answered. Prescriptions given.  Patient discharged via wheelchair with volunteer services to husbands car

## 2012-08-27 NOTE — Discharge Summary (Signed)
Physician Discharge Summary  Patient ID: Carolyn [name] MRN: [id number] DOB/AGE: [birth year] 47 y.o.  Admit date: 08/21/2012 Discharge date: 08/31/2012  Admission Diagnoses:  Forestier's disease of cervical region  Discharge Diagnoses:  Principal Problem:   Forestier's disease of cervical region Active Problems:   Other dysphagia   Past Medical History  Diagnosis Date  . Hypertension     takes Hyzaar daily  . Neck pain     bone spur on esophagus  . Headache     MRI in 2007 bc of headaches  . Arthritis   . Urinary frequency   . Heart murmur     New diagnosis  . Dry eyes     uses Restasis bid  . Depression     but doesn't require any meds    Surgeries: Procedure(s): Excision of exostosis C2-3,C3-4, C4-5 on 08/21/2012   Consultants (if any):  none  Discharged Condition: Improved  Hospital Course: Carolyn [name] is an 47 y.o. female who was admitted 08/21/2012 with a diagnosis of Forestier's disease of cervical region and went to the operating room on 08/21/2012 and underwent the above named procedures.    She was given perioperative antibiotics:      Anti-infectives   Start     Dose/Rate Route Frequency Ordered Stop   08/21/12 2200  ceFAZolin (ANCEF) IVPB 1 g/50 mL premix     1 g 100 mL/hr over 30 Minutes Intravenous Every 8 hours 08/21/12 1651 08/22/12 0630   08/21/12 0600  ceFAZolin (ANCEF) IVPB 2 g/50 mL premix     2 g 100 mL/hr over 30 Minutes Intravenous On call to O.R. 08/20/12 1423 08/21/12 1435    .  She was given sequential compression devices, early ambulation for DVT prophylaxis. Swallowing was improved post op.  Pain well controlled with po meds. Pt ambulating well and independent of discharge to home.  She benefited maximally from the hospital stay and there were no complications.    Recent vital signs:  Filed Vitals:   08/22/12 0545  BP: 95/63  Pulse: 79  Temp: 98.1 F (36.7 C)  Resp: 16    Recent laboratory studies:  Lab Results   Component Value Date   HGB 13.7 08/20/2012   Lab Results  Component Value Date   WBC 9.2 08/20/2012   PLT 230 08/20/2012   No results found for this basename: INR   Lab Results  Component Value Date   NA 138 08/20/2012   K 3.3* 08/20/2012   CL 99 08/20/2012   CO2 30 08/20/2012   BUN 17 08/20/2012   CREATININE 0.60 08/20/2012   GLUCOSE 98 08/20/2012    Discharge Medications:     Medication List    TAKE these medications       cycloSPORINE 0.05 % ophthalmic emulsion  Commonly known as:  RESTASIS  Place 1 drop into both eyes 2 (two) times daily.     HYDROcodone-acetaminophen 7.5-500 MG/15ML solution  Commonly known as:  LORTAB  Take 15 mLs by mouth every 4 (four) hours as needed for pain.     losartan-hydrochlorothiazide 100-25 MG per tablet  Commonly known as:  HYZAAR  Take 1 tablet by mouth daily.        Diagnostic Studies: Dg Chest 2 View  08/20/2012  *RADIOLOGY REPORT*  Clinical Data: Hypertension  CHEST - 2 VIEW  Comparison: None.  Findings: Cardiomediastinal silhouette appears normal.  No acute pulmonary disease is noted.  Bony thorax is intact.  IMPRESSION: No acute cardiopulmonary  abnormality seen.   Original Report Authenticated By: Lupita Raider.,  M.D.    Dg Cervical Spine 1 View  08/21/2012  *RADIOLOGY REPORT*  Clinical Data: Osteophytes in the cervical spine creating dysphagia.  DG CERVICAL SPINE - 1 VIEW,DG C-ARM 61-120 MIN  Comparison: MRI dated 08/01/2012  Findings: Single lateral C-arm image demonstrates the patient has had the prominent anterior osteophytes at C2-3, C3-4, and anterior to C4 and to the C4-5 disc space resected.  IMPRESSION: Anterior osteophytes resected from C2 to C5.   Original Report Authenticated By: Francene Boyers, M.D.    Dg C-arm [phone] Min  08/21/2012  *RADIOLOGY REPORT*  Clinical Data: Osteophytes in the cervical spine creating dysphagia.  DG CERVICAL SPINE - 1 VIEW,DG C-ARM 61-120 MIN  Comparison: MRI dated 08/01/2012  Findings: Single  lateral C-arm image demonstrates the patient has had the prominent anterior osteophytes at C2-3, C3-4, and anterior to C4 and to the C4-5 disc space resected.  IMPRESSION: Anterior osteophytes resected from C2 to C5.   Original Report Authenticated By: Francene Boyers, M.D.     Disposition: 01-Home or Self Care  Discharge Orders   Future Orders Complete By Expires     Call MD / Call 911  As directed     Comments:      If you experience chest pain or shortness of breath, CALL 911 and be transported to the hospital emergency room.  If you develope a fever above 101 F, pus (white drainage) or increased drainage or redness at the wound, or calf pain, call your surgeon's office.    Constipation Prevention  As directed     Comments:      Drink plenty of fluids.  Prune juice may be helpful.  You may use a stool softener, such as Colace (over the counter) 100 mg twice a day.  Use MiraLax (over the counter) for constipation as needed.    Diet - low sodium heart healthy  As directed     Discharge instructions  As directed     Comments:      No lifting greater than 10 lbs. No overhead use of arms. Avoid bending,and twisting neck. Walk in house for first week them may start to get out slowly increasing distance up to one mile by 3 weeks post op. Keep incision dry for 3 days, may then bathe and wet incision  Call if any fevers >101, chills, or increasing numbness or weakness or increased swelling or drainage.    Increase activity slowly as tolerated  As directed        Follow-up Information   Follow up with NITKA,JAMES E, MD. Schedule an appointment as soon as possible for a visit in 2 weeks. (or as scheduled)    Contact information:   [address] [place] [address] [phone]        Signed: Wende Neighbors 08/31/2012, 1:07 PM

## 2012-08-31 DIAGNOSIS — M4810 Ankylosing hyperostosis [Forestier], site unspecified: Secondary | ICD-10-CM | POA: Diagnosis present

## 2012-08-31 DIAGNOSIS — R1319 Other dysphagia: Secondary | ICD-10-CM | POA: Diagnosis present

## 2012-08-31 NOTE — Discharge Summary (Signed)
Patient D/C summary reviewed with Dondra Spry.

## 2012-12-17 ENCOUNTER — Encounter: Payer: Self-pay | Admitting: Cardiovascular Disease

## 2012-12-17 ENCOUNTER — Encounter: Payer: Self-pay | Admitting: *Deleted

## 2012-12-17 DIAGNOSIS — H04123 Dry eye syndrome of bilateral lacrimal glands: Secondary | ICD-10-CM | POA: Insufficient documentation

## 2012-12-17 DIAGNOSIS — F32A Depression, unspecified: Secondary | ICD-10-CM | POA: Insufficient documentation

## 2012-12-17 DIAGNOSIS — I10 Essential (primary) hypertension: Secondary | ICD-10-CM | POA: Insufficient documentation

## 2012-12-17 DIAGNOSIS — R011 Cardiac murmur, unspecified: Secondary | ICD-10-CM | POA: Insufficient documentation

## 2012-12-17 DIAGNOSIS — F329 Major depressive disorder, single episode, unspecified: Secondary | ICD-10-CM | POA: Insufficient documentation

## 2012-12-18 ENCOUNTER — Encounter: Payer: Self-pay | Admitting: Cardiovascular Disease

## 2012-12-18 ENCOUNTER — Ambulatory Visit (INDEPENDENT_AMBULATORY_CARE_PROVIDER_SITE_OTHER): Payer: BC Managed Care – PPO | Admitting: Cardiovascular Disease

## 2012-12-18 VITALS — BP 155/87 | HR 63 | Wt 177.0 lb

## 2012-12-18 DIAGNOSIS — I10 Essential (primary) hypertension: Secondary | ICD-10-CM

## 2012-12-18 DIAGNOSIS — M4810 Ankylosing hyperostosis [Forestier], site unspecified: Secondary | ICD-10-CM

## 2012-12-18 DIAGNOSIS — R079 Chest pain, unspecified: Secondary | ICD-10-CM

## 2012-12-18 DIAGNOSIS — R011 Cardiac murmur, unspecified: Secondary | ICD-10-CM

## 2012-12-18 DIAGNOSIS — M4812 Ankylosing hyperostosis [Forestier], cervical region: Secondary | ICD-10-CM

## 2012-12-18 NOTE — Patient Instructions (Addendum)
Your physician recommends that you schedule a follow-up appointment in: AS NEEDED  Your physician recommends that you continue on your current medications as directed. Please refer to the Current Medication list given to you today. Your physician has requested that you have a stress echocardiogram. For further information please visit [email or web address] Please follow instruction sheet as given.  CALCIUM SCORE OUT OF POCKET  $150.00

## 2012-12-18 NOTE — Assessment & Plan Note (Signed)
Well controlled.  Continue current medications and low sodium Dash type diet.    

## 2012-12-18 NOTE — Assessment & Plan Note (Signed)
Benign SEM no significant pathology on echo 5/14

## 2012-12-18 NOTE — Assessment & Plan Note (Signed)
Atypical normal Echo and ECG  F/U ETT

## 2012-12-18 NOTE — Assessment & Plan Note (Signed)
Suspect some of her chest symptoms related to chronic C spine disease Clear to have any further surgery needed

## 2012-12-18 NOTE — Progress Notes (Signed)
Patient ID: Carolyn [name], female   DOB: January 20, 1966, 47 y.o.   MRN: [id number] 47 y.o. with murmur and atypical chest pain.  Lots of cervical spine issues with multiple surgeries by Dr Otelia Sergeant.  Has had long standing sharp sternal chest pain. Not always exertional Can be at rest Lately pain has gone more to the right parasternal side. No pleuritic component cough fever .  No recent trauma.  Has not had stress testing  ECG done by [facility] 08/15/12 NSR rate 67 and totally normal  Reviewed echo from 09/06/12  Normal with only trace TR noted.    ROS: Denies fever, malais, weight loss, blurry vision, decreased visual acuity, cough, sputum, SOB, hemoptysis, pleuritic pain, palpitaitons, heartburn, abdominal pain, melena, lower extremity edema, claudication, or rash.  All other systems reviewed and negative   General: Affect appropriate Healthy:  appears stated age HEENT: normal Neck supple with no adenopathy JVP normal no bruits no thyromegaly Lungs clear with no wheezing and good diaphragmatic motion Heart:  S1/S2 no murmur,rub, gallop or click PMI normal Abdomen: benighn, BS positve, no tenderness, no AAA no bruit.  No HSM or HJR Distal pulses intact with no bruits No edema Neuro non-focal Skin warm and dry No muscular weakness  Medications Current Outpatient Prescriptions  Medication Sig Dispense Refill  . cycloSPORINE (RESTASIS) 0.05 % ophthalmic emulsion Place 1 drop into both eyes 2 (two) times daily.      [name] HYDROcodone-acetaminophen (LORTAB) 7.5-500 MG/15ML solution Take 15 mLs by mouth every 4 (four) hours as needed for pain.  120 mL  0  . losartan-hydrochlorothiazide (HYZAAR) 100-25 MG per tablet Take 1 tablet by mouth daily.       No current facility-administered medications for this visit.    Allergies Review of patient's allergies indicates no known allergies.  Family History: No family history on file.  Social History: History   Social History  . Marital Status: Married   Spouse Name: N/A    Number of Children: N/A  . Years of Education: N/A   Occupational History  . Not on file.   Social History Main Topics  . Smoking status: Never Smoker   . Smokeless tobacco: Not on file  . Alcohol Use: Yes     Comment: rarely  . Drug Use: No  . Sexual Activity: Yes    Birth Control/ Protection: Surgical   Other Topics Concern  . Not on file   Social History Narrative  . No narrative on file    Electrocardiogram:  NSR rate 63 normal   Assessment and Plan

## 2012-12-19 ENCOUNTER — Other Ambulatory Visit: Payer: Self-pay | Admitting: *Deleted

## 2012-12-19 DIAGNOSIS — R079 Chest pain, unspecified: Secondary | ICD-10-CM

## 2012-12-28 ENCOUNTER — Other Ambulatory Visit (HOSPITAL_COMMUNITY): Payer: BC Managed Care – PPO

## 2012-12-28 ENCOUNTER — Ambulatory Visit (INDEPENDENT_AMBULATORY_CARE_PROVIDER_SITE_OTHER)
Admission: RE | Admit: 2012-12-28 | Discharge: 2012-12-28 | Disposition: A | Discharge status: Home / Self Care | Payer: Self-pay | Source: Ambulatory Visit | Attending: Cardiovascular Disease | Admitting: Cardiovascular Disease

## 2012-12-28 DIAGNOSIS — R079 Chest pain, unspecified: Secondary | ICD-10-CM

## 2013-01-14 ENCOUNTER — Encounter: Payer: BC Managed Care – PPO | Admitting: Physician Assistant

## 2013-01-28 ENCOUNTER — Ambulatory Visit (INDEPENDENT_AMBULATORY_CARE_PROVIDER_SITE_OTHER): Payer: BC Managed Care – PPO | Admitting: Physician Assistant

## 2013-01-28 DIAGNOSIS — R079 Chest pain, unspecified: Secondary | ICD-10-CM

## 2013-01-28 NOTE — Progress Notes (Signed)
Exercise Treadmill Test  Pre-Exercise Testing Evaluation Rhythm: normal sinus  Rate: 75     Test  Exercise Tolerance Test Ordering MD: Charlton Haws, MD  Interpreting MD: Tereso Newcomer PA-C  Unique Test No: 1  Treadmill:  1  Indication for ETT: chest pain - rule out ischemia  Contraindication to ETT: No   Stress Modality: exercise - treadmill  Cardiac Imaging Performed: non   Protocol: standard Bruce - maximal  Max BP:  191/83  Max MPHR (bpm):  174 85% MPR (bpm):  148  MPHR obtained (bpm):  176 % MPHR obtained:  101  Reached 85% MPHR (min:sec):  5:43 Total Exercise Time (min-sec):  10:00  Workload in METS:  11.7 Borg Scale: 15  Reason ETT Terminated:  desired heart rate attained    ST Segment Analysis At Rest: normal ST segments - no evidence of significant ST depression With Exercise: no evidence of significant ST depression  Other Information Arrhythmia:  No Angina during ETT:  absent (0) Quality of ETT:  diagnostic  ETT Interpretation:  normal - no evidence of ischemia by ST analysis  Comments: Good exercise capacity. No chest pain. Normal BP response to exercise. No ST-T changes to suggest ischemia.  Recommendations: F/u with Dr. Charlton Haws as directed. Baseline BP above target.  Recommended to patient to f/u with PCP for HTN management. Signed,  Tereso Newcomer, PA-C   01/28/2013 11:34 AM

## 2013-09-10 ENCOUNTER — Other Ambulatory Visit (HOSPITAL_COMMUNITY): Payer: Self-pay | Admitting: Specialist

## 2013-09-10 DIAGNOSIS — R1310 Dysphagia, unspecified: Secondary | ICD-10-CM

## 2013-09-11 ENCOUNTER — Other Ambulatory Visit (HOSPITAL_COMMUNITY): Payer: Self-pay | Admitting: Specialist

## 2013-09-11 ENCOUNTER — Other Ambulatory Visit: Payer: Self-pay | Admitting: Otolaryngology

## 2013-09-11 DIAGNOSIS — R1310 Dysphagia, unspecified: Secondary | ICD-10-CM

## 2013-09-12 ENCOUNTER — Ambulatory Visit
Admission: RE | Admit: 2013-09-12 | Discharge: 2013-09-12 | Disposition: A | Discharge status: Home / Self Care | Payer: BC Managed Care – PPO | Source: Ambulatory Visit | Attending: Otolaryngology | Admitting: Otolaryngology

## 2013-09-12 ENCOUNTER — Ambulatory Visit (HOSPITAL_COMMUNITY): Admission: RE | Admit: 2013-09-12 | Payer: BC Managed Care – PPO | Source: Ambulatory Visit

## 2013-09-12 DIAGNOSIS — R1310 Dysphagia, unspecified: Secondary | ICD-10-CM

## 2013-09-19 ENCOUNTER — Ambulatory Visit
Admission: RE | Admit: 2013-09-19 | Discharge: 2013-09-19 | Disposition: A | Discharge status: Home / Self Care | Payer: BC Managed Care – PPO | Source: Ambulatory Visit | Attending: Gastroenterology | Admitting: Gastroenterology

## 2013-09-19 ENCOUNTER — Other Ambulatory Visit: Payer: Self-pay | Admitting: Gastroenterology

## 2013-09-19 DIAGNOSIS — R140 Abdominal distension (gaseous): Secondary | ICD-10-CM

## 2013-09-19 DIAGNOSIS — K5900 Constipation, unspecified: Secondary | ICD-10-CM

## 2013-10-31 ENCOUNTER — Other Ambulatory Visit: Payer: Self-pay | Admitting: *Deleted

## 2013-10-31 DIAGNOSIS — I83893 Varicose veins of bilateral lower extremities with other complications: Secondary | ICD-10-CM

## 2013-11-14 ENCOUNTER — Encounter: Payer: Self-pay | Admitting: Family

## 2013-11-15 ENCOUNTER — Encounter: Payer: Self-pay | Admitting: Vascular Surgery

## 2013-11-15 ENCOUNTER — Ambulatory Visit (INDEPENDENT_AMBULATORY_CARE_PROVIDER_SITE_OTHER): Payer: BC Managed Care – PPO | Admitting: Vascular Surgery

## 2013-11-15 ENCOUNTER — Ambulatory Visit (HOSPITAL_COMMUNITY)
Admission: RE | Admit: 2013-11-15 | Discharge: 2013-11-15 | Disposition: A | Discharge status: Home / Self Care | Payer: BC Managed Care – PPO | Source: Ambulatory Visit | Attending: Vascular Surgery | Admitting: Vascular Surgery

## 2013-11-15 VITALS — BP 150/85 | HR 70 | Temp 98.8°F | Resp 16 | Ht 68.0 in | Wt 202.0 lb

## 2013-11-15 DIAGNOSIS — I781 Nevus, non-neoplastic: Secondary | ICD-10-CM

## 2013-11-15 DIAGNOSIS — I83893 Varicose veins of bilateral lower extremities with other complications: Secondary | ICD-10-CM

## 2013-11-15 DIAGNOSIS — I8390 Asymptomatic varicose veins of unspecified lower extremity: Secondary | ICD-10-CM | POA: Insufficient documentation

## 2013-11-15 NOTE — Progress Notes (Signed)
Referred by:  Rory Percy, MD [address], [place] [address]  Reason for referral: Painful varicosities in left leg  History of Present Illness  Carolyn [name] is a 48 y.o. (09/14/1965) female who presents with chief complaint: painful varicose vein in left leg.  Patient notes, onset of swelling years , associated with no known trigger.  The patient's symptoms include: swelling and painful distension.  The patient has had no history of DVT, known history of pregnancy, known history of varicose vein, no history of venous stasis ulcers, no history of  Lymphedema and no history of skin changes in lower legs.  There is known family history of venous disorders.  The patient has never used compression stockings in the past.  Past Medical History  Diagnosis Date  . Hypertension     takes Hyzaar daily  . Neck pain     bone spur on esophagus  . Headache(784.0)     MRI in 2007 bc of headaches  . Arthritis   . Urinary frequency   . Heart murmur     New diagnosis  . Dry eyes     uses Restasis bid  . Depression     but doesn't require any meds    Past Surgical History  Procedure Laterality Date  . Abdominal hysterectomy  2011  . Anterior cervical decomp/discectomy fusion N/A 08/21/2012    Procedure: Excision of exostosis C2-3,C3-4, C4-5;  Surgeon: Jessy Oto, MD;  Location: [facility];  Service: Orthopedics;  Laterality: N/A;    History   Social History  . Marital Status: Married    Spouse Name: N/A    Number of Children: N/A  . Years of Education: N/A   Occupational History  . Not on file.   Social History Main Topics  . Smoking status: Never Smoker   . Smokeless tobacco: Not on file  . Alcohol Use: Yes     Comment: rarely  . Drug Use: No  . Sexual Activity: Yes    Birth Control/ Protection: Surgical   Other Topics Concern  . Not on file   Social History Narrative  . No narrative on file    Family History  Problem Relation Age of Onset  . Hypertension    .  Heart murmur Father     Current Outpatient Prescriptions on File Prior to Visit  Medication Sig Dispense Refill  . cycloSPORINE (RESTASIS) 0.05 % ophthalmic emulsion Place 1 drop into both eyes 2 (two) times daily.      . indomethacin (INDOCIN SR) 75 MG CR capsule Take 1 capsule by mouth daily.      [name] losartan-hydrochlorothiazide (HYZAAR) 100-25 MG per tablet Take 1 tablet by mouth daily.      . traMADol (ULTRAM) 50 MG tablet prn       No current facility-administered medications on file prior to visit.    No Known Allergies   REVIEW OF SYSTEMS:  (Positives checked otherwise negative)  CARDIOVASCULAR:  []  chest pain, []  chest pressure, []  palpitations, []  shortness of breath when laying flat, [x]  shortness of breath with exertion,  []  pain in feet when walking, [x]  pain in feet when laying flat, []  history of blood clot in veins (DVT), []  history of phlebitis, [x]  swelling in legs, [x]  varicose veins  PULMONARY:  []  productive cough, []  asthma, []  wheezing  NEUROLOGIC:  [x]  weakness in arms or legs, [x]  numbness in arms or legs, [x]  difficulty speaking or slurred speech, [x]  temporary  loss of vision in one eye, [x]  dizziness  HEMATOLOGIC:  []  bleeding problems, []  problems with blood clotting too easily  MUSCULOSKEL:  []  joint pain, []  joint swelling  GASTROINTEST:  []  vomiting blood, []  blood in stool     GENITOURINARY:  []  burning with urination, []  blood in urine  PSYCHIATRIC:  []  history of major depression  INTEGUMENTARY:  []  rashes, []  ulcers  CONSTITUTIONAL:  []  fever, []  chills   Physical Examination Filed Vitals:   11/15/13 1239  BP: 150/85  Pulse: 70  Temp: 98.8 F (37.1 C)  TempSrc: Oral  Resp: 16  Height: 5\' 8"  (1.727 m)  Weight: 202 lb (91.627 kg)  SpO2: 98%   Body mass index is 30.72 kg/(m^2).  General: A&O x 3, WDWN  Head: [place]/AT  Ear/Nose/Throat: Hearing grossly intact, nares w/o erythema or drainage, oropharynx w/o Erythema/Exudate  Eyes:  PERRLA, EOMI  Neck: Supple, no nuchal rigidity, no palpable LAD  Pulmonary: Sym exp, good air movt, CTAB, no rales, rhonchi, & wheezing  Cardiac: RRR, Nl S1, S2, no Murmurs, rubs or gallops  Vascular: Vessel Right Left  Radial Palpable Palpable  Brachial Palpable Palpable  Carotid Palpable, without bruit Palpable, without bruit  Aorta Not palpable N/A  Femoral Palpable Palpable  Popliteal Not palpable Not palpable  PT Palpable Palpable  DP Palpable Palpable   Gastrointestinal: soft, NTND, -G/R, - HSM, - masses, - CVAT B  Musculoskeletal: M/S 5/5 throughout , Extremities without ischemic changes , BLE faint varicose veins, spider veins throughout  Neurologic: CN 2-12 intact , Pain and light touch intact in extremities , Motor exam as listed above  Psychiatric: Judgment intact, Mood & affect appropriate for pt's clinical situation  Dermatologic: See M/S exam for extremity exam, no rashes otherwise noted  Lymph : No Cervical, Axillary, or Inguinal lymphadenopathy   Non-Invasive Vascular Imaging  LLE Venous Insufficiency Duplex (Date: 11/15/2013):   no DVT and SVT, + GSV reflux, + deep venous reflux  Medical Decision Making  Carolyn [name] is a 48 y.o. female who presents with: BLE sx varicose veins   Pt has a number of sx which are not c/w CVI.  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    Based on the patient's history and examination, I recommend: compressive therapy.  I discussed with the patient the use of her 20-30 mm thigh high compression stockings.  Thank you for allowing [place] to participate in this patient's care.  Adele Barthel, MD [facility] Office: [phone] Pager: [phone]  11/15/2013, 1:20 PM

## 2013-12-24 HISTORY — PX: ESOPHAGOGASTRODUODENOSCOPY: SHX1529

## 2014-12-03 ENCOUNTER — Other Ambulatory Visit: Payer: Self-pay | Admitting: Specialist

## 2014-12-03 DIAGNOSIS — M542 Cervicalgia: Secondary | ICD-10-CM

## 2014-12-08 ENCOUNTER — Ambulatory Visit
Admission: RE | Admit: 2014-12-08 | Discharge: 2014-12-08 | Disposition: A | Discharge status: Home / Self Care | Payer: BC Managed Care – PPO | Source: Ambulatory Visit | Attending: Specialist | Admitting: Specialist

## 2014-12-08 DIAGNOSIS — M542 Cervicalgia: Secondary | ICD-10-CM

## 2015-01-12 ENCOUNTER — Encounter: Payer: Self-pay | Admitting: *Deleted

## 2015-01-13 ENCOUNTER — Ambulatory Visit (INDEPENDENT_AMBULATORY_CARE_PROVIDER_SITE_OTHER): Payer: BC Managed Care – PPO | Admitting: Diagnostic Neuroimaging

## 2015-01-13 ENCOUNTER — Encounter: Payer: Self-pay | Admitting: Diagnostic Neuroimaging

## 2015-01-13 VITALS — BP 180/93 | HR 67 | Ht 68.0 in | Wt 208.0 lb

## 2015-01-13 DIAGNOSIS — M4802 Spinal stenosis, cervical region: Secondary | ICD-10-CM | POA: Diagnosis not present

## 2015-01-13 DIAGNOSIS — R292 Abnormal reflex: Secondary | ICD-10-CM | POA: Diagnosis not present

## 2015-01-13 DIAGNOSIS — M4810 Ankylosing hyperostosis [Forestier], site unspecified: Secondary | ICD-10-CM | POA: Diagnosis not present

## 2015-01-13 DIAGNOSIS — R29898 Other symptoms and signs involving the musculoskeletal system: Secondary | ICD-10-CM

## 2015-01-13 DIAGNOSIS — F329 Major depressive disorder, single episode, unspecified: Secondary | ICD-10-CM

## 2015-01-13 DIAGNOSIS — R269 Unspecified abnormalities of gait and mobility: Secondary | ICD-10-CM

## 2015-01-13 DIAGNOSIS — F411 Generalized anxiety disorder: Secondary | ICD-10-CM | POA: Diagnosis not present

## 2015-01-13 DIAGNOSIS — F32A Depression, unspecified: Secondary | ICD-10-CM

## 2015-01-13 NOTE — Patient Instructions (Signed)
I will check MRI brain and cervical spine.

## 2015-01-13 NOTE — Progress Notes (Signed)
[facility]  PATIENT: Carolyn [name] DOB: [birth year]  REFERRING CLINICIAN: Louanne Skye  HISTORY FROM: patient  REASON FOR VISIT: new consult    HISTORICAL  CHIEF COMPLAINT:  Chief Complaint  Patient presents with  . Hyperreflexia    rm 6, New Patient    HISTORY OF PRESENT ILLNESS:   49 year old right-handed female here for evaluation of hyperreflexia and cervical spinal stenosis.  Patient reports history of swallowing difficulties and neck stiffness problems for several years. She went to ENT, had x-ray of the cervical spine was found to have significant anterior flowing ossifications consistent with diffuse idiopathic skeletal hyperostosis. She was referred to orthopedic surgery, Dr. Louanne Skye, who performed surgical remover of anterior bone bridging and bone spurs. Immediately following surgery patient had significant improvement in range of motion of her neck as well as resolution of her swallowing difficulty. However immediately following surgery, she noticed progressively worsening numbness and tingling in her hands, weakness in her shoulders, stiffness and cramps in her ankles and lower extremities. Patient had follow-up evaluations with orthopedic surgery, with repeat MRI studies showing cervical spinal stenosis at C3-4 level. Patient was also found to have hyper reflexia in the upper and lower extremities. Over the past 3 months patient has had increasing problems with bladder dysfunction, now having to wear pads every day. Also patient having significant depression and anxiety symptoms with associated sleep disturbance and insomnia.  Patient referred to me for evaluation and neurologic opinion on whether patient's upper and lower extremity symptoms and hyperreflexia are related to cervical spinal stenosis or an alternate brain/upper motor neuron process.   REVIEW OF SYSTEMS: Full 14 system review of systems performed and notable only for weight gain fatigue swelling  in legs hearing loss itching moles snoring constipation incontinence pain joint swelling cramps aching muscles feeling cold anxiety decreased energy insomnia sleepiness restless legs dizziness numbness weakness headache confusion memory loss.   ALLERGIES: No Known Allergies  HOME MEDICATIONS: Outpatient Prescriptions Prior to Visit  Medication Sig Dispense Refill  . cycloSPORINE (RESTASIS) 0.05 % ophthalmic emulsion Place 1 drop into both eyes 2 (two) times daily.    [name] esomeprazole (NEXIUM) 20 MG capsule Take 20 mg by mouth 2 (two) times daily before a meal.    . losartan-hydrochlorothiazide (HYZAAR) 100-25 MG per tablet Take 1 tablet by mouth daily.    . traMADol (ULTRAM) 50 MG tablet prn    . cephALEXin (KEFLEX) 500 MG capsule     . indomethacin (INDOCIN SR) 75 MG CR capsule Take 1 capsule by mouth daily.    [name] triamcinolone cream (KENALOG) 0.1 %      No facility-administered medications prior to visit.    PAST MEDICAL HISTORY: Past Medical History  Diagnosis Date  . Hypertension     takes Hyzaar daily  . Neck pain     bone spur on esophagus  . Headache(784.0)     MRI in 2007 bc of headaches  . Arthritis     patient unsure  . Urinary frequency   . Heart murmur     New diagnosis  . Dry eyes     uses Restasis bid  . Depression     but doesn't require any meds  . Diffuse idiopathic skeletal hyperostosis     PAST SURGICAL HISTORY: Past Surgical History  Procedure Laterality Date  . Abdominal hysterectomy  2011    bleeding  . Anterior cervical decomp/discectomy fusion N/A 08/21/2012    Procedure: Excision of exostosis C2-3,C3-4, C4-5;  Surgeon: Jessy Oto, MD;  Location: [place];  Service: Orthopedics;  Laterality: N/A;    FAMILY HISTORY: Family History  Problem Relation Age of Onset  . Hypertension    . Heart murmur Father   . Hypertension Father   . Hypertension Mother   . Cancer Paternal Aunt     uterine  . Cancer Maternal Grandmother     lung  . Cancer  Maternal Grandfather     stomach  . Cancer Paternal Grandmother     lung    SOCIAL HISTORY:  Social History   Social History  . Marital Status: Married    Spouse Name: N/A  . Number of Children: 3  . Years of Education: N/A   Occupational History  .      Extension Agent,    Social History Main Topics  . Smoking status: Never Smoker   . Smokeless tobacco: Not on file  . Alcohol Use: Yes     Comment: rarely  . Drug Use: No  . Sexual Activity: Yes    Birth Control/ Protection: Surgical   Other Topics Concern  . Not on file   Social History Narrative   Married, lives with husband, daughter   Caffeine use - coffee 5 cups daily     PHYSICAL EXAM   GENERAL EXAM/CONSTITUTIONAL: Vitals:  Filed Vitals:   01/13/15 0835  BP: 180/93  Pulse: 67  Height: 5\' 8"  (1.727 m)  Weight: 208 lb (94.348 kg)     Body mass index is 31.63 kg/(m^2).  Visual Acuity Screening   Right eye Left eye Both eyes  Without correction:     With correction: 20/20 20/30      Patient is in no distress; well developed, nourished and groomed; neck is supple  CARDIOVASCULAR:  Examination of carotid arteries is normal; no carotid bruits  Regular rate and rhythm, no murmurs  Examination of peripheral vascular system by observation and palpation is normal  EYES:  Ophthalmoscopic exam of optic discs and posterior segments is normal; no papilledema or hemorrhages  MUSCULOSKELETAL:  Gait, strength, tone, movements noted in Neurologic exam below  NEUROLOGIC: MENTAL STATUS:  No flowsheet data found.  awake, alert, oriented to person, place and time  recent and remote memory intact  normal attention and concentration  language fluent, comprehension intact, naming intact,   fund of knowledge appropriate  CRANIAL NERVE:   2nd - no papilledema on fundoscopic exam  2nd, 3rd, 4th, 6th - pupils equal and reactive to light, visual fields full to confrontation, extraocular  muscles intact, no nystagmus  5th - facial sensation symmetric  7th - facial strength symmetric  8th - hearing intact  9th - palate elevates symmetrically, uvula midline  11th - shoulder shrug symmetric  12th - tongue protrusion midline  MOTOR:   normal bulk; NORMAL TONE IN BUE; SLIGHT INCR TONE IN BLE; DECR DELTOIDS (4+; DIFF RAISING HANDS OVER HEAD); OTHER BUE MUSCLES 5; BLE (R HF 4, L HF 5; KE 4, KF 4, DF 5)  SENSORY:   normal and symmetric to light touch, temperature, vibration; DECR PP IN UPPER AND LOWER EXT  COORDINATION:   finger-nose-finger, fine finger movements normal  REFLEXES:   deep tendon reflexes : BUE 2; KNEES 3; ANKLES 2; TOES DOWN GOING; BILATERAL HOFFMANS POSITIVE  GAIT/STATION:   SLIGHTLY CAUTIOUS, STIFF GAIT; narrow based gait; able to walk on toes, heels and tandem; romberg is negative    DIAGNOSTIC DATA (LABS, IMAGING, TESTING) - I  reviewed patient records, labs, notes, testing and imaging myself where available.  Lab Results  Component Value Date   WBC 9.2 08/20/2012   HGB 13.7 08/20/2012   HCT 38.2 08/20/2012   MCV 84.1 08/20/2012   PLT 230 08/20/2012      Component Value Date/Time   NA 138 08/20/2012 0819   K 3.3* 08/20/2012 0819   CL 99 08/20/2012 0819   CO2 30 08/20/2012 0819   GLUCOSE 98 08/20/2012 0819   BUN 17 08/20/2012 0819   CREATININE 0.60 08/20/2012 0819   CALCIUM 9.7 08/20/2012 0819   PROT 7.8 08/20/2012 0819   ALBUMIN 4.4 08/20/2012 0819   AST 14 08/20/2012 0819   ALT 14 08/20/2012 0819   ALKPHOS 64 08/20/2012 0819   BILITOT 0.7 08/20/2012 0819   GFRNONAA >90 08/20/2012 0819   GFRAA >90 08/20/2012 0819   No results found for: CHOL, HDL, LDLCALC, LDLDIRECT, TRIG, CHOLHDL No results found for: HGBA1C No results found for: VITAMINB12 No results found for: TSH   12/08/14 MRI cervical spine [I reviewed images myself and agree with interpretation. I would grade spinal stenosis at C3-4 as moderate. No cord signal  abnormalities. -VRP]  1. At C4-5 there is a mild broad-based disc bulge with a focal left foraminal disc osteophyte complex resulting in severe left foraminal stenosis. 2. At C3-4 there is a broad central disc protrusion abutting the ventral cervical spinal cord. Bilateral uncovertebral degenerative changes.    ASSESSMENT AND PLAN  49 y.o. year old female here with history of DISH (dx'd after dysphagia evaluation), with improvement in dyphagia after spur removal in 2014, but development and worsening of bilateral hand numbness, leg/foot stiffness post-operatively, with increasing bladder dysfunction in last 3 months. Exam notable for weakness in BUE and BLE; slight hyperreflexia in knees. Overall I think patient's symptoms are likely related to her moderate cervical spinal stenosis at C3-4, as well as severe left foraminal stenosis at C4-5 and mild biforaminal stenosis at C5-6 especially given the timing of onset of symptoms following her surgery. This may have created some dynamic intermittent compression as queried by Dr. Louanne Skye himself.  to complete her workup I will check MRI of the brain to rule out other upper motor neuron processes. Also I will repeat her MRI cervical spine as her symptoms seem to be worsening over the past one month.   At the end of the visit patient was very tearful, telling me that she is having more problems with memory loss, inability to regulate her mood and emotions, and poor insight into her own emotional lability. Understandably, this is likely the result of the stress related to her significant physical and neurologic symptoms, multiple evaluations, scans, testing and surgery. I will set up psychology evaluation to see if this can help her with coping and adjustment.  Dx:  Hyperreflexia - Plan: MR Cervical Spine Wo Contrast, MR Brain Wo Contrast  Gait difficulty - Plan: MR Cervical Spine Wo Contrast, MR Brain Wo Contrast  DISH (diffuse idiopathic skeletal  hyperostosis) - Plan: MR Cervical Spine Wo Contrast, MR Brain Wo Contrast  Spinal stenosis in cervical region - Plan: MR Cervical Spine Wo Contrast, MR Brain Wo Contrast  Upper extremity weakness - Plan: MR Cervical Spine Wo Contrast, MR Brain Wo Contrast  Weakness of both lower extremities - Plan: MR Cervical Spine Wo Contrast, MR Brain Wo Contrast    PLAN: - check MRI brain and cervical spine  Orders Placed This Encounter  Procedures  . MR  Cervical Spine Wo Contrast  . MR Brain Wo Contrast  . Ambulatory referral to Psychology   Return in about 6 weeks (around 02/24/2015).    Penni Bombard, MD 06/18/8248, 0:37 AM Certified in Neurology, Neurophysiology and Neuroimaging  [facility] [address] [place], [place] [address] [phone]

## 2015-02-04 ENCOUNTER — Ambulatory Visit (INDEPENDENT_AMBULATORY_CARE_PROVIDER_SITE_OTHER): Payer: BC Managed Care – PPO

## 2015-02-04 DIAGNOSIS — R29898 Other symptoms and signs involving the musculoskeletal system: Secondary | ICD-10-CM

## 2015-02-04 DIAGNOSIS — M4810 Ankylosing hyperostosis [Forestier], site unspecified: Secondary | ICD-10-CM

## 2015-02-04 DIAGNOSIS — M4802 Spinal stenosis, cervical region: Secondary | ICD-10-CM

## 2015-02-04 DIAGNOSIS — R269 Unspecified abnormalities of gait and mobility: Secondary | ICD-10-CM | POA: Diagnosis not present

## 2015-02-04 DIAGNOSIS — R292 Abnormal reflex: Secondary | ICD-10-CM

## 2015-02-10 ENCOUNTER — Telehealth: Payer: Self-pay | Admitting: Diagnostic Neuroimaging

## 2015-02-10 NOTE — Telephone Encounter (Signed)
Dr Louanne Skye called to speak with Dr Leta Baptist. He saw pt today and recommended neck surgery. He would like to discuss with you. He can be reached at [phone].

## 2015-02-11 NOTE — Telephone Encounter (Signed)
I called Dr. Louanne Skye. Wrong number given in phone message. Please call his office tomorrow and then he can text me on my cell phone. -VRP

## 2015-02-12 NOTE — Telephone Encounter (Signed)
Spoke with [employer], Dr Otho Ket office and per Dr Leta Baptist, gave her his cell number requesting Dr Louanne Skye text him re: this mutual patient. Amber verbalized understanding. Gave Amber this office's number for further questions as needed.

## 2015-02-16 NOTE — Telephone Encounter (Signed)
I spoke with Dr. Louanne Skye and reviewed scan results and findings with him. I agree that her symptooms are arising from a c-spine localization and not from her brain or brainstem. He is planning to schedule her for another c-spine surgery shortly.

## 2015-02-19 ENCOUNTER — Other Ambulatory Visit (HOSPITAL_COMMUNITY): Payer: Self-pay | Admitting: Specialist

## 2015-02-20 ENCOUNTER — Other Ambulatory Visit (HOSPITAL_COMMUNITY): Payer: Self-pay | Admitting: *Deleted

## 2015-02-20 NOTE — Pre-Procedure Instructions (Addendum)
    Carolyn [name]  02/20/2015      [id number] - [place], [place] [address] [place] [address] Phone: [phone] Fax: [phone]    Your procedure is scheduled on Tuesday, March 03, 2015 at 7:30 AM.  Report to [facility] Entrance "A" Admitting Office at 5:30 AM.  Call this number if you have problems the morning of surgery: [phone]  Any questions prior to day of surgery, please call [phone] between 8 & 4 PM.   Remember:  Do not eat food or drink liquids after midnight Monday, 03/02/15.  Take these medicines the morning of surgery with A SIP OF WATER: Eye drops, Tylenol - if needed, Nexium - if needed   Do not wear jewelry, make-up or nail polish.  Do not wear lotions, powders, or perfumes.  You may wear deodorant.  Do not shave 48 hours prior to surgery.    Do not bring valuables to the hospital.  [facility] is not responsible for any belongings or valuables.  Contacts, dentures or bridgework may not be worn into surgery.  Leave your suitcase in the car.  After surgery it may be brought to your room.  For patients admitted to the hospital, discharge time will be determined by your treatment team.  Special instructions:  See "Preparing for Surgery" Instruction sheet.   Please read over the following fact sheets that you were given. Pain Booklet, Coughing and Deep Breathing, MRSA Information and Surgical Site Infection Prevention

## 2015-02-23 ENCOUNTER — Encounter (HOSPITAL_COMMUNITY)
Admission: RE | Admit: 2015-02-23 | Discharge: 2015-02-23 | Disposition: A | Discharge status: Home / Self Care | Payer: BC Managed Care – PPO | Source: Ambulatory Visit | Attending: Specialist | Admitting: Specialist

## 2015-02-23 ENCOUNTER — Other Ambulatory Visit: Payer: Self-pay

## 2015-02-23 ENCOUNTER — Encounter (HOSPITAL_COMMUNITY): Payer: Self-pay

## 2015-02-23 DIAGNOSIS — I10 Essential (primary) hypertension: Secondary | ICD-10-CM | POA: Diagnosis not present

## 2015-02-23 DIAGNOSIS — K219 Gastro-esophageal reflux disease without esophagitis: Secondary | ICD-10-CM | POA: Insufficient documentation

## 2015-02-23 DIAGNOSIS — Z01812 Encounter for preprocedural laboratory examination: Secondary | ICD-10-CM | POA: Diagnosis not present

## 2015-02-23 DIAGNOSIS — M5020 Other cervical disc displacement, unspecified cervical region: Secondary | ICD-10-CM | POA: Diagnosis not present

## 2015-02-23 DIAGNOSIS — Z01818 Encounter for other preprocedural examination: Secondary | ICD-10-CM | POA: Insufficient documentation

## 2015-02-23 DIAGNOSIS — Z79899 Other long term (current) drug therapy: Secondary | ICD-10-CM | POA: Insufficient documentation

## 2015-02-23 DIAGNOSIS — M4802 Spinal stenosis, cervical region: Secondary | ICD-10-CM | POA: Diagnosis not present

## 2015-02-23 HISTORY — DX: Gastro-esophageal reflux disease without esophagitis: K21.9

## 2015-02-23 HISTORY — DX: Other specified postprocedural states: Z98.890

## 2015-02-23 HISTORY — DX: Anxiety disorder, unspecified: F41.9

## 2015-02-23 HISTORY — DX: Nausea with vomiting, unspecified: R11.2

## 2015-02-23 LAB — COMPREHENSIVE METABOLIC PANEL
ALBUMIN: 3.9 g/dL (ref 3.5–5.0)
ALK PHOS: 58 U/L (ref 38–126)
ALT: 21 U/L (ref 14–54)
ANION GAP: 9 (ref 5–15)
AST: 19 U/L (ref 15–41)
BUN: 12 mg/dL (ref 6–20)
CO2: 29 mmol/L (ref 22–32)
Calcium: 9.7 mg/dL (ref 8.9–10.3)
Chloride: 102 mmol/L (ref 101–111)
Creatinine, Ser: 0.7 mg/dL (ref 0.44–1.00)
GFR calc Af Amer: >60 mL/min (ref >60)
GFR calc non Af Amer: >60 mL/min (ref >60)
Glucose, Bld: 100 mg/dL — ABNORMAL HIGH (ref 65–99)
POTASSIUM: 3.2 mmol/L — AB (ref 3.5–5.1)
SODIUM: 140 mmol/L (ref 135–145)
TOTAL PROTEIN: 7.1 g/dL (ref 6.5–8.1)
Total Bilirubin: 0.8 mg/dL (ref 0.3–1.2)

## 2015-02-23 LAB — CBC
HCT: 37.5 % (ref 36.0–46.0)
HEMOGLOBIN: 12.7 g/dL (ref 12.0–15.0)
MCH: 29.7 pg (ref 26.0–34.0)
MCHC: 33.9 g/dL (ref 30.0–36.0)
MCV: 87.6 fL (ref 78.0–100.0)
PLATELETS: 228 10*3/uL (ref 150–400)
RBC: 4.28 MIL/uL (ref 3.87–5.11)
RDW: 13.5 % (ref 11.5–15.5)
WBC: 7.1 10*3/uL (ref 4.0–10.5)

## 2015-02-23 LAB — SURGICAL PCR SCREEN
MRSA, PCR: NEGATIVE
Staphylococcus aureus: NEGATIVE

## 2015-02-23 NOTE — Progress Notes (Addendum)
PCP is Dr Rory Percy (sees his PA) Cardiologist is Dr Johnsie Cancel Echo noted in epic form 09-06-2012 Stress test noted in [place] from 01-28-2013 Denies ever having a card cath. 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

## 2015-02-24 ENCOUNTER — Ambulatory Visit: Payer: BC Managed Care – PPO | Admitting: Diagnostic Neuroimaging

## 2015-02-24 NOTE — Progress Notes (Addendum)
Anesthesia Chart Review:  Pt is 49 year old female scheduled for C3-4 ACDF with partial vertebrectomy on 03/03/2015 with Dr. Louanne Skye.   PCP Dr. Rory Percy at [facility] [place] in [place].   PMH includes: HTN, heart murmur (benign SEM), GERD, post-op N/V. Never smoker. BMI 32. S/p excision of exostosis C2-5 08/21/12.   Medications include: nexium, losartan-hctz.   Preoperative labs reviewed.    EKG 02/23/15: Sinus bradycardia (56 bpm) with sinus arrhythmia.  Exercise treadmill test 01/28/13: normal - no evidence of ischemia by ST analysis  Echo 09/06/12: LV normal size and shape. EF 63%. Trace TR, trace PR.   Pt reported at PAT she had been tested at PCP's office a year ago and was positive for a "clotting gene". Attempting to get records.  Will revisit chart when records available.   Willeen Cass, FNP-BC [facility]/Anesthesiology Phone: [phone] 02/24/2015 10:51 AM  Addendum:  Received records from PCP's office. 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 It appears that this is associated with intermediate PAI-1 activity and antigen levels, placing pt at an increased risk for CAD and VTE.   Willeen Cass, FNP-BC [facility]/Anesthesiology Phone: [phone] 02/25/2015 4:52 PM

## 2015-03-02 NOTE — Anesthesia Preprocedure Evaluation (Addendum)
Anesthesia Evaluation  Patient identified by MRN, date of birth, ID band Patient awake    Reviewed: Allergy & Precautions, NPO status , Patient's Chart, lab work & pertinent test results  History of Anesthesia Complications (+) PONV and history of anesthetic complications  Airway Mallampati: III  TM Distance: >3 FB Neck ROM: Limited    Dental  (+) Teeth Intact   Pulmonary    breath sounds clear to auscultation       Cardiovascular hypertension, Pt. on medications  Rhythm:Regular Rate:Normal     Neuro/Psych  Headaches, PSYCHIATRIC DISORDERS Anxiety Depression    GI/Hepatic Neg liver ROS, GERD  Medicated,  Endo/Other  negative endocrine ROS  Renal/GU negative Renal ROS  negative genitourinary   Musculoskeletal  (+) Arthritis , Osteoarthritis,    Abdominal   Peds negative pediatric ROS (+)  Hematology negative hematology ROS (+)   Anesthesia Other Findings   Reproductive/Obstetrics negative OB ROS                            Lab Results  Component Value Date   WBC 7.1 02/23/2015   HGB 12.7 02/23/2015   HCT 37.5 02/23/2015   MCV 87.6 02/23/2015   PLT 228 02/23/2015   Lab Results  Component Value Date   CREATININE 0.70 02/23/2015   BUN 12 02/23/2015   NA 140 02/23/2015   K 3.2* 02/23/2015   CL 102 02/23/2015   CO2 29 02/23/2015   No results found for: INR, PROTIME  EKG: sinus bradycardia.   Anesthesia Physical Anesthesia Plan  ASA: II  Anesthesia Plan: General   Post-op Pain Management:    Induction: Intravenous  Airway Management Planned: Oral ETT and Video Laryngoscope Planned  Additional Equipment:   Intra-op Plan:   Post-operative Plan: Extubation in OR  Informed Consent: I have reviewed the patients History and Physical, chart, labs and discussed the procedure including the risks, benefits and alternatives for the proposed anesthesia with the patient or  authorized representative who has indicated his/her understanding and acceptance.   Dental advisory given  Plan Discussed with: CRNA  Anesthesia Plan Comments:         Anesthesia Quick Evaluation

## 2015-03-03 ENCOUNTER — Inpatient Hospital Stay (HOSPITAL_COMMUNITY)
Admission: RE | Admit: 2015-03-03 | Discharge: 2015-03-13 | DRG: 471 | Disposition: A | Discharge status: Home Health | Payer: BC Managed Care – PPO | Source: Ambulatory Visit | Attending: Specialist | Admitting: Specialist

## 2015-03-03 ENCOUNTER — Encounter (HOSPITAL_COMMUNITY)
Admission: RE | Disposition: A | Discharge status: Home Health | Payer: Self-pay | Source: Ambulatory Visit | Attending: Specialist

## 2015-03-03 ENCOUNTER — Ambulatory Visit (HOSPITAL_COMMUNITY): Payer: BC Managed Care – PPO | Admitting: Emergency Medicine

## 2015-03-03 ENCOUNTER — Ambulatory Visit (HOSPITAL_COMMUNITY): Payer: BC Managed Care – PPO

## 2015-03-03 ENCOUNTER — Ambulatory Visit (HOSPITAL_COMMUNITY): Payer: BC Managed Care – PPO | Admitting: Anesthesiology

## 2015-03-03 ENCOUNTER — Encounter (HOSPITAL_COMMUNITY): Payer: Self-pay | Admitting: Anesthesiology

## 2015-03-03 DIAGNOSIS — M5000 Cervical disc disorder with myelopathy, unspecified cervical region: Secondary | ICD-10-CM | POA: Diagnosis present

## 2015-03-03 DIAGNOSIS — E860 Dehydration: Secondary | ICD-10-CM | POA: Diagnosis present

## 2015-03-03 DIAGNOSIS — M4802 Spinal stenosis, cervical region: Secondary | ICD-10-CM | POA: Diagnosis present

## 2015-03-03 DIAGNOSIS — Y838 Other surgical procedures as the cause of abnormal reaction of the patient, or of later complication, without mention of misadventure at the time of the procedure: Secondary | ICD-10-CM | POA: Diagnosis not present

## 2015-03-03 DIAGNOSIS — M5001 Cervical disc disorder with myelopathy,  high cervical region: Secondary | ICD-10-CM | POA: Diagnosis present

## 2015-03-03 DIAGNOSIS — R1314 Dysphagia, pharyngoesophageal phase: Secondary | ICD-10-CM | POA: Diagnosis not present

## 2015-03-03 DIAGNOSIS — R739 Hyperglycemia, unspecified: Secondary | ICD-10-CM | POA: Diagnosis not present

## 2015-03-03 DIAGNOSIS — S1121XD Laceration without foreign body of pharynx and cervical esophagus, subsequent encounter: Secondary | ICD-10-CM | POA: Diagnosis not present

## 2015-03-03 DIAGNOSIS — D62 Acute posthemorrhagic anemia: Secondary | ICD-10-CM | POA: Diagnosis not present

## 2015-03-03 DIAGNOSIS — J189 Pneumonia, unspecified organism: Secondary | ICD-10-CM | POA: Diagnosis not present

## 2015-03-03 DIAGNOSIS — E162 Hypoglycemia, unspecified: Secondary | ICD-10-CM | POA: Diagnosis present

## 2015-03-03 DIAGNOSIS — K228 Other specified diseases of esophagus: Secondary | ICD-10-CM

## 2015-03-03 DIAGNOSIS — S1121XA Laceration without foreign body of pharynx and cervical esophagus, initial encounter: Secondary | ICD-10-CM | POA: Diagnosis not present

## 2015-03-03 DIAGNOSIS — N179 Acute kidney failure, unspecified: Secondary | ICD-10-CM | POA: Diagnosis not present

## 2015-03-03 DIAGNOSIS — R0902 Hypoxemia: Secondary | ICD-10-CM | POA: Diagnosis not present

## 2015-03-03 DIAGNOSIS — R609 Edema, unspecified: Secondary | ICD-10-CM | POA: Diagnosis not present

## 2015-03-03 DIAGNOSIS — K223 Perforation of esophagus: Secondary | ICD-10-CM | POA: Diagnosis not present

## 2015-03-03 DIAGNOSIS — R059 Cough, unspecified: Secondary | ICD-10-CM | POA: Insufficient documentation

## 2015-03-03 DIAGNOSIS — J690 Pneumonitis due to inhalation of food and vomit: Secondary | ICD-10-CM | POA: Diagnosis not present

## 2015-03-03 DIAGNOSIS — K2289 Other specified disease of esophagus: Secondary | ICD-10-CM

## 2015-03-03 DIAGNOSIS — K219 Gastro-esophageal reflux disease without esophagitis: Secondary | ICD-10-CM | POA: Diagnosis present

## 2015-03-03 DIAGNOSIS — R011 Cardiac murmur, unspecified: Secondary | ICD-10-CM | POA: Diagnosis present

## 2015-03-03 DIAGNOSIS — Y95 Nosocomial condition: Secondary | ICD-10-CM | POA: Diagnosis not present

## 2015-03-03 DIAGNOSIS — I872 Venous insufficiency (chronic) (peripheral): Secondary | ICD-10-CM | POA: Diagnosis present

## 2015-03-03 DIAGNOSIS — Z419 Encounter for procedure for purposes other than remedying health state, unspecified: Secondary | ICD-10-CM

## 2015-03-03 DIAGNOSIS — R600 Localized edema: Secondary | ICD-10-CM | POA: Diagnosis not present

## 2015-03-03 DIAGNOSIS — Y828 Other medical devices associated with adverse incidents: Secondary | ICD-10-CM | POA: Diagnosis not present

## 2015-03-03 DIAGNOSIS — R221 Localized swelling, mass and lump, neck: Secondary | ICD-10-CM

## 2015-03-03 DIAGNOSIS — F419 Anxiety disorder, unspecified: Secondary | ICD-10-CM | POA: Diagnosis present

## 2015-03-03 DIAGNOSIS — I10 Essential (primary) hypertension: Secondary | ICD-10-CM | POA: Diagnosis present

## 2015-03-03 DIAGNOSIS — Z981 Arthrodesis status: Secondary | ICD-10-CM

## 2015-03-03 DIAGNOSIS — Y92234 Operating room of hospital as the place of occurrence of the external cause: Secondary | ICD-10-CM | POA: Diagnosis not present

## 2015-03-03 DIAGNOSIS — R05 Cough: Secondary | ICD-10-CM

## 2015-03-03 DIAGNOSIS — Z8249 Family history of ischemic heart disease and other diseases of the circulatory system: Secondary | ICD-10-CM | POA: Diagnosis not present

## 2015-03-03 DIAGNOSIS — K9171 Accidental puncture and laceration of a digestive system organ or structure during a digestive system procedure: Secondary | ICD-10-CM | POA: Diagnosis not present

## 2015-03-03 DIAGNOSIS — Z79899 Other long term (current) drug therapy: Secondary | ICD-10-CM

## 2015-03-03 DIAGNOSIS — M542 Cervicalgia: Secondary | ICD-10-CM | POA: Diagnosis present

## 2015-03-03 HISTORY — PX: DIRECT LARYNGOSCOPY: SHX5326

## 2015-03-03 HISTORY — PX: ANTERIOR CERVICAL DECOMP/DISCECTOMY FUSION: SHX1161

## 2015-03-03 HISTORY — PX: REPAIR OF ESOPHAGUS: SHX6062

## 2015-03-03 HISTORY — DX: Spinal stenosis, cervical region: M48.02

## 2015-03-03 SURGERY — ANTERIOR CERVICAL DECOMPRESSION/DISCECTOMY FUSION 1 LEVEL
Anesthesia: General | Site: Esophagus

## 2015-03-03 MED ORDER — CETYLPYRIDINIUM CHLORIDE 0.05 % MT LIQD: 7.0000 mL | Freq: Two times a day (BID) | OROMUCOSAL | Status: DC

## 2015-03-03 MED ORDER — BUPIVACAINE LIPOSOME 1.3 % IJ SUSP
20.0000 mL | INTRAMUSCULAR | Status: DC
  Filled 2015-03-03: qty 20

## 2015-03-03 MED ORDER — LACTATED RINGERS IV SOLN: INTRAVENOUS | Status: DC

## 2015-03-03 MED ORDER — 0.9 % SODIUM CHLORIDE (POUR BTL) OPTIME
TOPICAL | Status: DC | PRN
  Administered 2015-03-03: 1000 mL

## 2015-03-03 MED ORDER — FENTANYL CITRATE (PF) 250 MCG/5ML IJ SOLN
INTRAMUSCULAR | Status: AC
  Filled 2015-03-03: qty 5

## 2015-03-03 MED ORDER — NEOSTIGMINE METHYLSULFATE 10 MG/10ML IV SOLN
INTRAVENOUS | Status: DC | PRN
  Administered 2015-03-03: 4 mg via INTRAVENOUS

## 2015-03-03 MED ORDER — THROMBIN 20000 UNITS EX KIT
PACK | CUTANEOUS | Status: DC | PRN
  Administered 2015-03-03: 20 mL via TOPICAL

## 2015-03-03 MED ORDER — LIDOCAINE HCL (CARDIAC) 20 MG/ML IV SOLN
INTRAVENOUS | Status: AC
  Filled 2015-03-03: qty 5

## 2015-03-03 MED ORDER — HYDROMORPHONE HCL 1 MG/ML IJ SOLN
1.0000 mg | INTRAMUSCULAR | Status: DC | PRN
  Administered 2015-03-03 – 2015-03-10 (×39): 1 mg via INTRAVENOUS
  Filled 2015-03-03 (×39): qty 1

## 2015-03-03 MED ORDER — NEOSTIGMINE METHYLSULFATE 10 MG/10ML IV SOLN
INTRAVENOUS | Status: AC
Start: 2015-03-03 — End: 2015-03-03
  Filled 2015-03-03: qty 1

## 2015-03-03 MED ORDER — MEPERIDINE HCL 25 MG/ML IJ SOLN: 6.2500 mg | INTRAMUSCULAR | Status: DC | PRN

## 2015-03-03 MED ORDER — METHOCARBAMOL 1000 MG/10ML IJ SOLN
500.0000 mg | Freq: Four times a day (QID) | INTRAMUSCULAR | Status: DC | PRN
  Administered 2015-03-03 – 2015-03-09 (×14): 500 mg via INTRAVENOUS
  Filled 2015-03-03 (×24): qty 5

## 2015-03-03 MED ORDER — SODIUM CHLORIDE 0.9 % IJ SOLN
3.0000 mL | Freq: Two times a day (BID) | INTRAMUSCULAR | Status: DC
  Administered 2015-03-03 – 2015-03-11 (×5): 3 mL via INTRAVENOUS

## 2015-03-03 MED ORDER — PANTOPRAZOLE SODIUM 40 MG IV SOLR
40.0000 mg | Freq: Two times a day (BID) | INTRAVENOUS | Status: DC
  Administered 2015-03-03 – 2015-03-12 (×19): 40 mg via INTRAVENOUS
  Filled 2015-03-03 (×21): qty 40

## 2015-03-03 MED ORDER — PHENYLEPHRINE 40 MCG/ML (10ML) SYRINGE FOR IV PUSH (FOR BLOOD PRESSURE SUPPORT)
PREFILLED_SYRINGE | INTRAVENOUS | Status: AC
  Filled 2015-03-03: qty 10

## 2015-03-03 MED ORDER — BUPIVACAINE HCL (PF) 0.5 % IJ SOLN
INTRAMUSCULAR | Status: AC
  Filled 2015-03-03: qty 30

## 2015-03-03 MED ORDER — SCOPOLAMINE 1 MG/3DAYS TD PT72
MEDICATED_PATCH | TRANSDERMAL | Status: DC | PRN
  Administered 2015-03-03: 1 via TRANSDERMAL

## 2015-03-03 MED ORDER — ROCURONIUM BROMIDE 50 MG/5ML IV SOLN
INTRAVENOUS | Status: AC
  Filled 2015-03-03: qty 2

## 2015-03-03 MED ORDER — PHENOL 1.4 % MT LIQD
1.0000 | OROMUCOSAL | Status: DC | PRN
  Administered 2015-03-04: 1 via OROMUCOSAL
  Filled 2015-03-03: qty 177

## 2015-03-03 MED ORDER — SUCCINYLCHOLINE CHLORIDE 20 MG/ML IJ SOLN
INTRAMUSCULAR | Status: AC
  Filled 2015-03-03: qty 1

## 2015-03-03 MED ORDER — CHLORHEXIDINE GLUCONATE 4 % EX LIQD: 60.0000 mL | Freq: Once | CUTANEOUS | Status: DC

## 2015-03-03 MED ORDER — CEFAZOLIN SODIUM 1-5 GM-% IV SOLN
1.0000 g | Freq: Three times a day (TID) | INTRAVENOUS | Status: DC
  Administered 2015-03-03 – 2015-03-05 (×5): 1 g via INTRAVENOUS
  Filled 2015-03-03 (×7): qty 50

## 2015-03-03 MED ORDER — METHYLENE BLUE 1 % INJ SOLN
INTRAMUSCULAR | Status: AC
Start: 2015-03-03 — End: 2015-03-03
  Filled 2015-03-03: qty 10

## 2015-03-03 MED ORDER — BUPIVACAINE LIPOSOME 1.3 % IJ SUSP
INTRAMUSCULAR | Status: DC | PRN
  Administered 2015-03-03: 20 mL

## 2015-03-03 MED ORDER — GLYCOPYRROLATE 0.2 MG/ML IJ SOLN
INTRAMUSCULAR | Status: AC
  Filled 2015-03-03: qty 3

## 2015-03-03 MED ORDER — CEFAZOLIN SODIUM-DEXTROSE 2-3 GM-% IV SOLR
INTRAVENOUS | Status: AC
  Filled 2015-03-03: qty 50

## 2015-03-03 MED ORDER — ARTIFICIAL TEARS OP OINT
TOPICAL_OINTMENT | OPHTHALMIC | Status: AC
  Filled 2015-03-03: qty 3.5

## 2015-03-03 MED ORDER — ROCURONIUM BROMIDE 100 MG/10ML IV SOLN
INTRAVENOUS | Status: DC | PRN
  Administered 2015-03-03: 50 mg via INTRAVENOUS
  Administered 2015-03-03 (×5): 10 mg via INTRAVENOUS

## 2015-03-03 MED ORDER — ARTIFICIAL TEARS OP OINT
TOPICAL_OINTMENT | OPHTHALMIC | Status: DC | PRN
  Administered 2015-03-03: 1 via OPHTHALMIC

## 2015-03-03 MED ORDER — CHLORHEXIDINE GLUCONATE 0.12 % MT SOLN
15.0000 mL | Freq: Two times a day (BID) | OROMUCOSAL | Status: DC
  Administered 2015-03-03 – 2015-03-13 (×17): 15 mL via OROMUCOSAL
  Filled 2015-03-03 (×20): qty 15

## 2015-03-03 MED ORDER — HEMOSTATIC AGENTS (NO CHARGE) OPTIME
TOPICAL | Status: DC | PRN
  Administered 2015-03-03: 1 via TOPICAL

## 2015-03-03 MED ORDER — HYDROMORPHONE HCL 1 MG/ML IJ SOLN
INTRAMUSCULAR | Status: AC
  Filled 2015-03-03: qty 2

## 2015-03-03 MED ORDER — SUCCINYLCHOLINE CHLORIDE 20 MG/ML IJ SOLN
INTRAMUSCULAR | Status: DC | PRN
  Administered 2015-03-03: 140 mg via INTRAVENOUS

## 2015-03-03 MED ORDER — POTASSIUM CHLORIDE IN NACL 20-0.45 MEQ/L-% IV SOLN
INTRAVENOUS | Status: DC
  Administered 2015-03-03 – 2015-03-09 (×5): via INTRAVENOUS
  Filled 2015-03-03 (×16): qty 1000

## 2015-03-03 MED ORDER — SODIUM CHLORIDE 0.9 % IJ SOLN: 3.0000 mL | INTRAMUSCULAR | Status: DC | PRN

## 2015-03-03 MED ORDER — METHOCARBAMOL 500 MG PO TABS
500.0000 mg | ORAL_TABLET | Freq: Four times a day (QID) | ORAL | Status: DC | PRN
  Filled 2015-03-03: qty 1

## 2015-03-03 MED ORDER — ONDANSETRON HCL 4 MG/2ML IJ SOLN
INTRAMUSCULAR | Status: DC | PRN
  Administered 2015-03-03 (×2): 4 mg via INTRAVENOUS

## 2015-03-03 MED ORDER — PROMETHAZINE HCL 25 MG/ML IJ SOLN: 6.2500 mg | INTRAMUSCULAR | Status: DC | PRN

## 2015-03-03 MED ORDER — FENTANYL CITRATE (PF) 100 MCG/2ML IJ SOLN
INTRAMUSCULAR | Status: DC | PRN
  Administered 2015-03-03 (×3): 50 ug via INTRAVENOUS
  Administered 2015-03-03 (×2): 100 ug via INTRAVENOUS

## 2015-03-03 MED ORDER — CEFAZOLIN SODIUM-DEXTROSE 2-3 GM-% IV SOLR
2.0000 g | INTRAVENOUS | Status: AC
  Administered 2015-03-03 (×2): 2 g via INTRAVENOUS

## 2015-03-03 MED ORDER — HYDROMORPHONE HCL 1 MG/ML IJ SOLN
INTRAMUSCULAR | Status: AC
  Filled 2015-03-03: qty 1

## 2015-03-03 MED ORDER — SODIUM CHLORIDE 0.9 % IR SOLN
Status: DC | PRN
  Administered 2015-03-03: 500 mL

## 2015-03-03 MED ORDER — CETYLPYRIDINIUM CHLORIDE 0.05 % MT LIQD
7.0000 mL | Freq: Two times a day (BID) | OROMUCOSAL | Status: DC
  Administered 2015-03-04 – 2015-03-13 (×15): 7 mL via OROMUCOSAL

## 2015-03-03 MED ORDER — HYDROMORPHONE HCL 1 MG/ML IJ SOLN
0.2500 mg | INTRAMUSCULAR | Status: DC | PRN
Start: 2015-03-03 — End: 2015-03-03
  Administered 2015-03-03 (×4): 0.5 mg via INTRAVENOUS

## 2015-03-03 MED ORDER — THROMBIN 20000 UNITS EX SOLR
CUTANEOUS | Status: AC
  Filled 2015-03-03: qty 20000

## 2015-03-03 MED ORDER — PHENYLEPHRINE HCL 10 MG/ML IJ SOLN
INTRAMUSCULAR | Status: DC | PRN
  Administered 2015-03-03: 80 ug via INTRAVENOUS

## 2015-03-03 MED ORDER — CYCLOSPORINE 0.05 % OP EMUL
1.0000 [drp] | Freq: Two times a day (BID) | OPHTHALMIC | Status: DC
  Administered 2015-03-03 – 2015-03-06 (×7): 1 [drp] via OPHTHALMIC
  Administered 2015-03-07: 10:00:00 via OPHTHALMIC
  Administered 2015-03-07 – 2015-03-13 (×12): 1 [drp] via OPHTHALMIC
  Filled 2015-03-03 (×22): qty 1

## 2015-03-03 MED ORDER — LACTATED RINGERS IV SOLN
INTRAVENOUS | Status: DC | PRN
  Administered 2015-03-03 (×3): via INTRAVENOUS

## 2015-03-03 MED ORDER — MIDAZOLAM HCL 2 MG/2ML IJ SOLN
INTRAMUSCULAR | Status: AC
  Filled 2015-03-03: qty 4

## 2015-03-03 MED ORDER — PROPOFOL 10 MG/ML IV BOLUS
INTRAVENOUS | Status: AC
  Filled 2015-03-03: qty 20

## 2015-03-03 MED ORDER — LIDOCAINE HCL (CARDIAC) 20 MG/ML IV SOLN
INTRAVENOUS | Status: DC | PRN
  Administered 2015-03-03: 100 mg via INTRAVENOUS

## 2015-03-03 MED ORDER — MIDAZOLAM HCL 5 MG/5ML IJ SOLN
INTRAMUSCULAR | Status: DC | PRN
  Administered 2015-03-03: 2 mg via INTRAVENOUS

## 2015-03-03 MED ORDER — ACETAMINOPHEN 650 MG RE SUPP
650.0000 mg | RECTAL | Status: DC | PRN
Start: 2015-03-03 — End: 2015-03-13

## 2015-03-03 MED ORDER — SODIUM CHLORIDE 0.9 % IJ SOLN
INTRAMUSCULAR | Status: AC
  Filled 2015-03-03: qty 10

## 2015-03-03 MED ORDER — ONDANSETRON HCL 4 MG/2ML IJ SOLN
4.0000 mg | INTRAMUSCULAR | Status: DC | PRN
  Administered 2015-03-04 – 2015-03-09 (×4): 4 mg via INTRAVENOUS
  Filled 2015-03-03 (×4): qty 2

## 2015-03-03 MED ORDER — DEXAMETHASONE SODIUM PHOSPHATE 4 MG/ML IJ SOLN: INTRAMUSCULAR | Status: DC | PRN

## 2015-03-03 MED ORDER — ACETAMINOPHEN 325 MG PO TABS: 650.0000 mg | ORAL_TABLET | ORAL | Status: DC | PRN

## 2015-03-03 MED ORDER — GLYCOPYRROLATE 0.2 MG/ML IJ SOLN
INTRAMUSCULAR | Status: DC | PRN
Start: 2015-03-03 — End: 2015-03-03
  Administered 2015-03-03: 0.6 mg via INTRAVENOUS

## 2015-03-03 MED ORDER — DEXAMETHASONE SODIUM PHOSPHATE 4 MG/ML IJ SOLN
INTRAMUSCULAR | Status: DC | PRN
  Administered 2015-03-03: 4 mg via INTRAVENOUS

## 2015-03-03 MED ORDER — ONDANSETRON HCL 4 MG/2ML IJ SOLN
INTRAMUSCULAR | Status: AC
  Filled 2015-03-03: qty 2

## 2015-03-03 MED ORDER — PROPOFOL 10 MG/ML IV BOLUS
INTRAVENOUS | Status: DC | PRN
  Administered 2015-03-03 (×2): 50 mg via INTRAVENOUS
  Administered 2015-03-03: 150 mg via INTRAVENOUS
  Administered 2015-03-03: 30 mg via INTRAVENOUS

## 2015-03-03 MED ORDER — SODIUM CHLORIDE 0.9 % IV SOLN: 250.0000 mL | INTRAVENOUS | Status: DC

## 2015-03-03 MED ORDER — BUPIVACAINE HCL 0.5 % IJ SOLN
INTRAMUSCULAR | Status: DC | PRN
  Administered 2015-03-03: 30 mL

## 2015-03-03 MED ORDER — PROPOFOL 500 MG/50ML IV EMUL
INTRAVENOUS | Status: DC | PRN
  Administered 2015-03-03: 50 ug/kg/min via INTRAVENOUS

## 2015-03-03 MED ORDER — MENTHOL 3 MG MT LOZG
1.0000 | LOZENGE | OROMUCOSAL | Status: DC | PRN
  Filled 2015-03-03: qty 9

## 2015-03-03 SURGICAL SUPPLY — 82 items
ADH SKN CLS APL DERMABOND .7 (GAUZE/BANDAGES/DRESSINGS) ×2
BAG DECANTER FOR FLEXI CONT (MISCELLANEOUS) ×1 IMPLANT
BIT DRILL SRG 14X2.2XFLT CHK (BIT) IMPLANT
BIT DRL SRG 14X2.2XFLT CHK (BIT) ×2
BLADE SURG ROTATE 9660 (MISCELLANEOUS) IMPLANT
BNDG ADH 5X4 AIR PERM ELC (GAUZE/BANDAGES/DRESSINGS) ×2
BNDG COHESIVE 4X5 WHT NS (GAUZE/BANDAGES/DRESSINGS) ×1 IMPLANT
BONE MATRIX VIVIGEN 1CC (Bone Implant) ×1 IMPLANT
BUR MATCHSTICK NEURO 3.0 LAGG (BURR) IMPLANT
BUR RND FLUTED 2.5 (BURR) IMPLANT
BUR SABER RD CUTTING 3.0 (BURR) IMPLANT
CANISTER SUCTION 2500CC (MISCELLANEOUS) ×3 IMPLANT
CLSR STERI-STRIP ANTIMIC 1/2X4 (GAUZE/BANDAGES/DRESSINGS) ×1 IMPLANT
COLLAR CERV LO CONTOUR FIRM DE (SOFTGOODS) IMPLANT
CORTRAK2 10FR X 109CM FEEDING TUBE ×1 IMPLANT
COVER BACK TABLE 60X90IN (DRAPES) ×1 IMPLANT
COVER SURGICAL LIGHT HANDLE (MISCELLANEOUS) ×3 IMPLANT
DECANTER SPIKE VIAL GLASS SM (MISCELLANEOUS) ×1 IMPLANT
DERMABOND ADVANCED (GAUZE/BANDAGES/DRESSINGS) ×1
DERMABOND ADVANCED .7 DNX12 (GAUZE/BANDAGES/DRESSINGS) ×2 IMPLANT
DRAIN PENROSE 1/4X12 LTX STRL (WOUND CARE) ×2 IMPLANT
DRAIN TLS ROUND 10FR (DRAIN) IMPLANT
DRAPE C-ARM 42X72 X-RAY (DRAPES) IMPLANT
DRAPE MICROSCOPE LEICA (MISCELLANEOUS) ×3 IMPLANT
DRAPE POUCH INSTRU U-SHP 10X18 (DRAPES) ×3 IMPLANT
DRAPE SURG 17X23 STRL (DRAPES) ×9 IMPLANT
DRILL BIT SKYLINE 14MM (BIT) ×3
DRSG EMULSION OIL 3X3 NADH (GAUZE/BANDAGES/DRESSINGS) ×1 IMPLANT
DRSG MEPILEX BORDER 4X4 (GAUZE/BANDAGES/DRESSINGS) IMPLANT
DURAPREP 6ML APPLICATOR 50/CS (WOUND CARE) ×3 IMPLANT
ELECT BLADE 4.0 EZ CLEAN MEGAD (MISCELLANEOUS)
ELECT COATED BLADE 2.86 ST (ELECTRODE) ×3 IMPLANT
ELECT REM PT RETURN 9FT ADLT (ELECTROSURGICAL) ×3
ELECTRODE BLDE 4.0 EZ CLN MEGD (MISCELLANEOUS) IMPLANT
ELECTRODE REM PT RTRN 9FT ADLT (ELECTROSURGICAL) ×2 IMPLANT
GAUZE SPONGE 4X4 12PLY STRL (GAUZE/BANDAGES/DRESSINGS) ×1 IMPLANT
GLOVE BIOGEL PI IND STRL 8 (GLOVE) ×2 IMPLANT
GLOVE BIOGEL PI INDICATOR 8 (GLOVE) ×1
GLOVE ECLIPSE 9.0 STRL (GLOVE) ×3 IMPLANT
GLOVE SURG 8.5 LATEX PF (GLOVE) ×3 IMPLANT
GLOVE SURG ORTHO 8.0 STRL STRW (GLOVE) ×3 IMPLANT
GOWN STRL REUS W/ TWL LRG LVL3 (GOWN DISPOSABLE) ×2 IMPLANT
GOWN STRL REUS W/TWL 2XL LVL3 (GOWN DISPOSABLE) ×6 IMPLANT
GOWN STRL REUS W/TWL LRG LVL3 (GOWN DISPOSABLE) ×3
HEAD HALTER (SOFTGOODS) ×3 IMPLANT
KIT BASIN OR (CUSTOM PROCEDURE TRAY) ×3 IMPLANT
KIT ROOM TURNOVER OR (KITS) ×3 IMPLANT
MARKER SKIN DUAL TIP RULER LAB (MISCELLANEOUS) ×1 IMPLANT
NDL SPNL 20GX3.5 QUINCKE YW (NEEDLE) ×4 IMPLANT
NEEDLE SPNL 20GX3.5 QUINCKE YW (NEEDLE) ×6 IMPLANT
NS IRRIG 1000ML POUR BTL (IV SOLUTION) ×4 IMPLANT
PACK ORTHO CERVICAL (CUSTOM PROCEDURE TRAY) ×3 IMPLANT
PAD ABD 8X10 STRL (GAUZE/BANDAGES/DRESSINGS) ×1 IMPLANT
PAD ARMBOARD 7.5X6 YLW CONV (MISCELLANEOUS) ×6 IMPLANT
PATTIES SURGICAL .5 X.5 (GAUZE/BANDAGES/DRESSINGS) IMPLANT
PIN DISTRACTION 14 (PIN) ×2 IMPLANT
PIN DISTRACTION 14MM (PIN) ×6 IMPLANT
PLATE ONE LEVEL SKYLINE 14MM (Plate) ×1 IMPLANT
SCREW SKYLINE VAR OS 14MM (Screw) ×1 IMPLANT
SCREW VAR SELF TAP SKYLINE 14M (Screw) ×4 IMPLANT
SPACER ADV ACF 9MM (Bone Implant) ×1 IMPLANT
SPONGE INTESTINAL PEANUT (DISPOSABLE) IMPLANT
SPONGE LAP 4X18 X RAY DECT (DISPOSABLE) ×1 IMPLANT
SPONGE SURGIFOAM ABS GEL 100 (HEMOSTASIS) ×3 IMPLANT
SUT ETHILON 4 0 PS 2 18 (SUTURE) IMPLANT
SUT VIC AB 2-0 CT1 27 (SUTURE) ×3
SUT VIC AB 2-0 CT1 TAPERPNT 27 (SUTURE) ×2 IMPLANT
SUT VIC AB 3-0 CT1 27 (SUTURE) ×3
SUT VIC AB 3-0 CT1 TAPERPNT 27 (SUTURE) IMPLANT
SUT VIC AB 3-0 SH 27 (SUTURE) ×3
SUT VIC AB 3-0 SH 27X BRD (SUTURE) IMPLANT
SUT VIC AB 3-0 SH 8-18 (SUTURE) ×1 IMPLANT
SUT VIC AB 3-0 X1 27 (SUTURE) IMPLANT
SUT VICRYL 4-0 PS2 18IN ABS (SUTURE) ×3 IMPLANT
SYR 20CC LL (SYRINGE) ×3 IMPLANT
SYR CONTROL 10ML LL (SYRINGE) ×3 IMPLANT
SYSTEM CHEST DRAIN TLS 7FR (DRAIN) IMPLANT
TOWEL OR 17X24 6PK STRL BLUE (TOWEL DISPOSABLE) ×4 IMPLANT
TOWEL OR 17X26 10 PK STRL BLUE (TOWEL DISPOSABLE) ×4 IMPLANT
TRAY FOLEY CATH SILVER 16FR (SET/KITS/TRAYS/PACK) ×1 IMPLANT
TUBE CONNECTING 20X1/4 (TUBING) ×1 IMPLANT
WATER STERILE IRR 1000ML POUR (IV SOLUTION) ×2 IMPLANT

## 2015-03-03 NOTE — Anesthesia Procedure Notes (Signed)
Procedure Name: Intubation Date/Time: 03/03/2015 8:08 AM Performed by: Scheryl Darter Pre-anesthesia Checklist: Patient identified, Emergency Drugs available, Suction available, Patient being monitored and Timeout performed Patient Re-evaluated:Patient Re-evaluated prior to inductionOxygen Delivery Method: Circle system utilized Preoxygenation: Pre-oxygenation with 100% oxygen Intubation Type: IV induction Ventilation: Mask ventilation without difficulty Laryngoscope Size: Glidescope (small adult blade) Grade View: Grade III Tube size: 7.5 mm Number of attempts: 2 (attempted x1 with regular adult blade/too large/small adult adult blade ett placed with ease) Airway Equipment and Method: Video-laryngoscopy,  Stylet and Patient positioned with wedge pillow Placement Confirmation: ETT inserted through vocal cords under direct vision,  positive ETCO2 and breath sounds checked- equal and bilateral Secured at: 21 cm Tube secured with: Tape Dental Injury: Teeth and Oropharynx as per pre-operative assessment  Difficulty Due To: Difficulty was anticipated, Difficult Airway- due to limited oral opening and Difficult Airway- due to reduced neck mobility Future Recommendations: Recommend- induction with short-acting agent, and alternative techniques readily available

## 2015-03-03 NOTE — Op Note (Signed)
03/03/2015  12:57 PM    Carolyn [name]  [id number]   Pre-Op Dx:  Esophageal perforation  Post-op Dx:  same  Proc: Direct Laryngoscopy, Repair hypopharyngeal laceration, NG tube placement   Surg:  [name] T MD  Anes:  GOT  EBL:  none  Comp:   none  Findings:  2 cm vertical laceration, LEFT pyriform sinus.  Procedure: with the patient on the operating table and Dr. Louanne Skye in attendance, ENT was called in for assistance.  Upon exploring the neck wound, mucosa was identified.  The endotracheal tube was visible through the laceration.  A rubber tooth guard was applied. Taking care to protect lips, teeth, and endotracheal tube, a direct laryngoscope was introduced in the pharynx was inspected. The laceration was noted in the lateral left pyriform sinus.  A Crowe Davis mouth gag was introduced, expanded for visualization, and suspended in the standard fashion. It was not felt possible to reduce the laceration for purposes of closure from this approach. The mouthgag was relaxed and removed. Dental status is intact.  A Panda nasogastric feeding tube was placed into the right nostril and passed into the pharynx and down the esophagus  into the stomach. Air was ausculted in the stomach. Gastric contents were noted to be oozing back out of the tube.   The stylette was left in place for later x-ray confirmation of position.   The 93 French esophageal bougie was placed to assist with closure.  Working from the neck wound, the laceration was identified in the bougie noted. Interrupted inverting sutures of 4-0 Vicryl were placed individually and then tied sequentially. A second layer of soft tissue was closed over this. The wound was thoroughly irrigated. The bougie was removed. The nasogastric tube was allowed to remain in place.  At this point the procedure was returned to Dr. Louanne Skye for drain placement and closure.  .  Dispo:   OR to PACU.  Plan:  NG feeding 7 days. Antibiosis  5 days. Wound drainage until minimal quantity. If she is doing nicely, will check a Gastrografin swallow at 7 days followed by a barium swallow if appropriate.  [name] MD

## 2015-03-03 NOTE — Op Note (Signed)
03/03/2015  6:26 PM  PATIENT:  Carolyn [name]  49 y.o. 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  POST-OPERATIVE DIAGNOSIS:  C3-4 cervical stenosis due to herniated nucleus pulposus and spur with intraoperative esphageal tear  PROCEDURE:  Procedure(s): ANTERIOR CERVICAL DISCECTOMY AND FUSION WITH PARTIAL VERTEBRECTOMY C3 AND C4, CERVICAL PLATE AND SCREWS, ALLOGRAFT, LOCAL BONE GRAFT, VIVIGEN DIRECT LARYNGOSCOPY AND PLACEMENT OF FEEDING TUBE REPAIR OF ESOPHAGUS    SURGEON:  Jessy Oto, MD     ASSISTANT:  Benjiman Core, PA-C  (Present throughout the entire procedure and necessary for completion of procedure in a timely manner)     ANESTHESIA:  General, supplemented with local anesthesia marcaine 0.5% 1:1 exparel 1.3% total 5 cc, Dr. Smith Robert.    COMPLICATIONS:  Left pyriformis sinus pharygeoesophageal tear, 2 cm, repaired by Dr. Erik Obey.     COMPONENTS:  Implant Name Type Inv. Item Serial No. Manufacturer Lot No. LRB No. Used  BONE MATRIX VIVIGEN 1CC - [id number] Bone Implant BONE MATRIX VIVIGEN 1CC [id number] [facility] [place] [facility]  N/A 1  SPACER ADV ACF 9MM - [id number] Bone Implant SPACER ADV ACF 9MM [id number] [employer] FNDN  N/A 1  PLATE ONE LEVEL SKYLINE 50MM - [id number] Plate PLATE ONE LEVEL SKYLINE 50MM  DEPUY SPINE  N/A 1  SCREW VAR SELF TAP SKYLINE 50M - [id number] Screw SCREW VAR SELF TAP SKYLINE 50M  DEPUY SPINE  N/A 3  SCREW VAR SELF TAP SKYLINE 50M - [id number] Screw SCREW VAR SELF TAP SKYLINE 50M  DEPUY SPINE  N/A 1  SCREW SKYLINE VAR OS 50MM - [id number] Screw SCREW SKYLINE VAR OS 50MM   DEPUY SPINE   N/A 1    PROCEDURE:The patient was met in the holding area, and the appropriate left C3-4 cervical level identified and marked with an "x" and my initials. The patient was then transported to OR and was placed on the operative table in a supine  position head supported on the well padded [place] horseshoe. The patient was then placed under general anesthesia without difficulty intubated atraumaticly.  Cervical spine was positioned with a [place] horseshoe and 5 pound cervical halter traction. All pressure points well padded and semi-beach chair position. Arm holder both arms. Standard prep with DuraPrep solution the anterior cervical spine chest. Draped in the usual manner. Iodine vi drape was used. Standard timeout protocol was carried out identifying the patient procedure side of the procedure and level. The skin the left neck was infiltrated with Marcaine with epinephrine total of 10cc. This at the level of expected C3-4 incision and also along a skin crease in line with the patients lines of Langer and in line with the old previous incision scar. Incision transverse at the C3-4 level ellipsing the old incision scar  and carried down to the level of the platysma. Then was carried down to the anterior aspect of the sternocleidomastoid muscle. A single external jugular vein suture ligated with 2-0 vicryl. The interval between the trachea and esophagus medially and the carotid sheath laterally was developed as a Metzenbaum scissors and blunt dissection exposing the anterior aspect of cervical spine at the C3-4 level. The prevertebral fascia anterior to the cervical was cauterized with bipolar and teased across the midline with a Art therapist. An 18-gauge spinal needle was placed with sheath intact allowing only a centimeter to extend into the C3-4 disc and a second needle place at an  adjacent disc and observed on lateral C arm imaging at the C3-4 level and C4-5. Handheld Cloward retraction of the soft tissues while identifying the level at C3-4 a boney osteophyte overlying the C3-4 level was excised with a hand rounger and also while removing a portion of the anterior aspect of the discs removed with15 blade scalpel and pituitary. Medial border of  the longus collie muscles was carefully elevated bilaterally and self-retaining retractors were introduced the foot of the blade beneath the medial border of the longus colli muscles. At this time it was noted that an N-G tube was not present in the esophagus so that I requested that an NG tube be place for continued identification of the esophagus. With the placement of the NG tube palpation of the esophagus medially and demonstrated that the NG tube was able to be palpated direct visualization of this area under the operating room microscope did not disclose the NG tube though. However the finger palpation of the soft tissues medially was concerning so that instilling a methylene blue solution into the esophagus was performed.The operating room microscope was draped sterilely and brought into the field.  With the installation of the methylene blue solution there was no leakage into the anterior cervical incision site noted. Longus collie muscle along the lateral aspect of the midline structures and esophagus and noted to be slightly dusky but not definitely blue material. Contact was made with the ENT specialist on-call Dr. Shepard General and discussion was made regarding concerns about the possible esophageal tear. Dr. Lowella Petties he indicated that if leakage was found then he should be recontacted and also institution of the dye in combination with hydrogen peroxide may provide some better improvement in visualization of a dural tear. As there was no sign of a definite dural tear and continuation of the anterior cervical discectomy and fusion was carried out.  Soft tissue overlying the anterior borders of the disc space at C3-4 level carefully debridement of soft tissue back to bony edges. The anterior lip osteophytes were then resected using rongeur. This bone was preserved for later bone grafting purposes. The disc space was then first prepared using the operating room microscope with resection of degenerative  disc annulus anteriorly and posteriorly and cartilaginous endplates using micro-curettes pituitaries and a high-speed bur. Under the operating room microscope and posterior aspect of the disc was excised using micro-curettes pituitary rongeur and times per. Posterior lip osteophytes were drilled using a high-speed bur and a carefully resected with 1 and 2 mm Kerrison foraminotomy performed over both C4 nerve roots using 1 and 2 mm Kerrisons disc herniation material was noted centrally and into the right foramen some of which represented reflected portion of the patient's annulus into the neuroforamen most of the disc appeared to be calcified annulus and disc osteophyte. Following decompression of the spinal cord and both C4 nerve roots, irrigation was carried out. The endplates of the inferior aspect of C3 and superior aspect of C4 were carefully prepared using a high-speed bur to parallel. The disc space was then sounded utilized and a precontoured sounder for the transgraft implant. Surrounding was carried up to a 50m implant. 9 mm lordotic ACDF spacer was felt to provide best fit for the C3-4 disc space. Measurement of the depth of the ACDF of disc site showed a depth of 22 mm. The ACDF permanent lordotic allograft implant was then brought onto the field. It was then packed with local bone graftthat had been harvested previously as well  as did the Vivigen. The implant was then carefully placed over the intervertebral disc space at C3-4 level. Care taken to ensure that no bone or soft tissue debris within the disc space that could be retropulsed with insertion of the allograft bone graft. The implant was then impacted into place with the head placed in longitudinal cervical traction. The implant was felt to be in excellent position alignment. Cervical distraction instrumentation was removed and the screw post holes coated with wax for hemostasis. Length of the plate was then chosen using plate applied to a from  the edge of the lower cervical vertebral border of C4 the upper cervical border of C3. A 14 millimeter plate was chosen. 14 mm screws were then placed into the C3 locking the plate in place. Additional 14 mm screws were then placed in the C4 level cervical traction had been released prior to screw placement. Intraoperative lateral radiograph demonstrated the plates and screws in good position with no impingment on the cervical canal. The graft appeared in good position alignment.  This point irrigation was carried out at cervical incision site.The esophagus examined at the cervical level with the OR microscope. The small area of discolored longus colli muscle noted to be dusky of darker that the surrounding tissue lateral to the midline structures. In moving the hand held cloward anteriorly a sudden appearance of about 2cc of methylene blue appeared.  At this point the incision was irrigated with copious amounts of normal saline solution. Dr. Erik Obey ENT was recontacted and his assistance and consultation requested and he recommended esophagoscopy and Direct larnygeoscope set up while he was in transit to the operating room. Additional ancef redosing was performed at 4-5 hours into the procedure. Dr. Erik Obey first evaluated the esophageal tear via oral esophageal inspection and found that tonsil and adenoidectomy instruments were unable to be used to closed the tear from a superior oroesophageal Approach so that a enteric assess tube for tube feeding was placed. A verbuge tube placed for identification of the tear via the anterior cervical incision site. After further irrigation the tear in the left lateral pyriformis Area of the esophagus was repaired with interrupted inverted sutures. Further irrigation and closure of the dead space about the repair site was carried out.  Irrigation was again carried out there was no active bleeding present. A 1/4inch penrose drain placed over the left side of the  cervical plate and anterior cervical spine exiting inferior to the incision.The incision were then closed by approximating the deep subcutaneous layers the platysma layer with interrupted 2-0 Vicryl suture and the superficial fascia overlying the sternocleidomastoid muscle with interrupted 3-0 Vicryl sutures. The subcutaneous layers were approximated with interrupted 3-0 Vicryl sutures as were the superficial layers. The skin was closed with steristrips at the operative C3-4 transverse incision site. Adaptic, 4x4s and ABD then placed with an elastoplast pressure dressing.  A [place] collar was then applied to the cervical spine and drapes were removed. Patient was then reactivated extubated and returned to recovery room in satisfactory condition.   Benjiman Core PA-C perform the duties of assistant surgeon during this case. He was present from the beginning of the case to the end of the case assisting in transfer the patient from his stretcher to the OR table and back to the stretcher at the end of the case. Assisted in careful suction of the anterior cervical discectomy site and delicate neural structures operating under the operating room microscope. He performed closure of the incision from  the fascia to the skin applying the dressing.    Cydnee Fuquay E 03/03/2015, 6:26 PM

## 2015-03-03 NOTE — Brief Op Note (Addendum)
03/03/2015  1:08 PM  PATIENT:  [name]  49 y.o. female  PRE-OPERATIVE DIAGNOSIS:  C3-4 cervical stenosis due to herniated nucleus pulposus and spur  POST-OPERATIVE DIAGNOSIS:  C3-4 cervical stenosis due to herniated nucleus pulposus and spur with intraoperative esphageal tear  PROCEDURE:  Procedure(s): ANTERIOR CERVICAL DISCECTOMY AND FUSION WITH PARTIAL VERTEBRECTOMY C3 AND C4, CERVICAL PLATE AND SCREWS, ALLOGRAFT, LOCAL BONE GRAFT, VIVIGEN (N/A) DIRECT LARYNGOSCOPY AND PLACEMENT OF FEEDING TUBE REPAIR OF ESOPHAGUS  SURGEON:  Surgeon(s) and Role: Panel 1:    * Jessy Oto, MD - Primary  Panel 2:    * [name], MD - Primary  PHYSICIAN ASSISTANT:Loma [place] Ricard Dillon, PA-C    ANESTHESIA:   local and general, Suella Broad, MD  EBL:  Total I/O In: 2500 [I.V.:2500] Out: 200 [Blood:200]  BLOOD ADMINISTERED:none  DRAINS: Penrose drain in the Anterior left neck down to plate and Urinary Catheter (Foley)   LOCAL MEDICATIONS USED:  MARCAINE 0.5% 1:1 EXPAREL 1.3% Amount: 5 ml  SPECIMEN:  No Specimen  DISPOSITION OF SPECIMEN:  N/A  COUNTS:  YES  COMPLICATIONS: Left esophageal tear 2 cm left pyriformis sinus. DICTATION: IT trainer Dictation  PLAN OF CARE: Admit to inpatient   PATIENT DISPOSITION:  PACU - hemodynamically stable.   Delay start of Pharmacological VTE agent (>24hrs) due to surgical blood loss or risk of bleeding: yes

## 2015-03-03 NOTE — Transfer of Care (Signed)
Immediate Anesthesia Transfer of Care Note  Patient: Carolyn [name]  Procedure(s) Performed: Procedure(s): ANTERIOR CERVICAL DISCECTOMY AND FUSION WITH PARTIAL VERTEBRECTOMY C3 AND C4, CERVICAL PLATE AND SCREWS, ALLOGRAFT, LOCAL BONE GRAFT, VIVIGEN (N/A) DIRECT LARYNGOSCOPY AND PLACEMENT OF FEEDING TUBE REPAIR OF ESOPHAGUS  Patient Location: PACU  Anesthesia Type:General  Level of Consciousness: awake, alert , oriented and sedated  Airway & Oxygen Therapy: Patient Spontanous Breathing and Patient connected to face mask oxygen    Post-op Assessment: Report given to RN, Post -op Vital signs reviewed and stable and Patient moving all extremities  Post vital signs: Reviewed and stable  Last Vitals:  Filed Vitals:   03/03/15 1333  BP: 102/50  Pulse: 72  Temp: 36.8 C  Resp: 13    Complications: No apparent anesthesia complications

## 2015-03-03 NOTE — Interval H&P Note (Signed)
History and Physical Interval Note:  03/03/2015 7:29 AM  [name]  has presented today for surgery, with the diagnosis of C3-4 cervical stenosis due to herniated nucleus pulposus and spur  The various methods of treatment have been discussed with the patient and family. After consideration of risks, benefits and other options for treatment, the patient has consented to  Procedure(s): ANTERIOR CERVICAL DISCECTOMY AND FUSION WITH PARTIAL VERTEBRECTOMY C3 AND C4, CERVICAL PLATE AND SCREWS, ALLOGRAFT, LOCAL BONE GRAFT, VIVIGEN (N/A) as a surgical intervention .  The patient's history has been reviewed, patient examined, no change in status, stable for surgery.  I have reviewed the patient's chart and labs.  Questions were answered to the patient's satisfaction.     NITKA,JAMES E

## 2015-03-03 NOTE — Progress Notes (Signed)
Orthopedic Tech Progress Note Patient Details:  Carolyn [name] [birth year] [id number]  Ortho Devices Type of Ortho Device: Soft collar Ortho Device/Splint Location: neck Ortho Device/Splint Interventions: Application   Carolyn [name] 03/03/2015, 2:14 PM

## 2015-03-03 NOTE — Consult Note (Signed)
Carolyn [name], Carolyn [name] 49 y.o., female [id number]     Chief Complaint: esophageal trauma  HPI: 49 yo wf pt of Dr. Louanne Skye underwent removal of cervical osteophytes several years back.  Recently sustained a herniated C3-4 disk.  During repair, Dr. Louanne Skye noted esophageal tear.  ENT called for assistance.  PMH: Past Medical History  Diagnosis Date  . Hypertension     takes Hyzaar daily  . Neck pain     bone spur on esophagus  . Headache(784.0)     MRI in 2007 bc of headaches  . Arthritis     patient unsure  . Urinary frequency   . Heart murmur     New diagnosis  . Dry eyes     uses Restasis bid  . Depression     but doesn't require any meds  . Diffuse idiopathic skeletal hyperostosis   . PONV (postoperative nausea and vomiting)     nausea and vomiting   . Anxiety   . GERD (gastroesophageal reflux disease)     Surg Hx: Past Surgical History  Procedure Laterality Date  . Abdominal hysterectomy  2011    bleeding  . Anterior cervical decomp/discectomy fusion N/A 08/21/2012    Procedure: Excision of exostosis C2-3,C3-4, C4-5;  Surgeon: Jessy Oto, MD;  Location: [facility];  Service: Orthopedics;  Laterality: N/A;    FHx:   Family History  Problem Relation Age of Onset  . Hypertension    . Heart murmur Father   . Hypertension Father   . Hypertension Mother   . Cancer Paternal Aunt     uterine  . Cancer Maternal Grandmother     lung  . Cancer Maternal Grandfather     stomach  . Cancer Paternal Grandmother     lung   SocHx:  reports that she has never smoked. She does not have any smokeless tobacco history on file. She reports that she drinks alcohol. She reports that she does not use illicit drugs.  ALLERGIES: No Known Allergies  Medications Prior to Admission  Medication Sig Dispense Refill  . acetaminophen (TYLENOL) 500 MG tablet Take 1,000 mg by mouth every 8 (eight) hours as needed for mild pain or moderate pain.    . cycloSPORINE (RESTASIS) 0.05 % ophthalmic emulsion  Place 1 drop into both eyes 2 (two) times daily.    Carolyn [name] esomeprazole (NEXIUM) 20 MG capsule Take 20 mg by mouth daily as needed.     Carolyn [name] losartan-hydrochlorothiazide (HYZAAR) 100-25 MG per tablet Take 1 tablet by mouth daily.      No results found for this or any previous visit (from the past 48 hour(s)). Dg Cervical Spine 2 Or 3 Views  03/03/2015  CLINICAL DATA:  C3-4 fusion. EXAM: CERVICAL SPINE - 2-3 VIEW COMPARISON:  MRI cervical spine dated 02/04/2015. FINDINGS: Two cross-table lateral views, intraoperative, show anterior cervical fusion hardware placement at the C3-C4 levels with intervening disc spacer. IMPRESSION: Anterior cervical fusion hardware placement at C3-4. Electronically Signed   By: Franki Cabot M.D.   On: 03/03/2015 10:55      Blood pressure 171/74, pulse 73, temperature 98 F (36.7 C), temperature source Oral, resp. rate 20, weight 94.348 kg (208 lb), SpO2 100 %.  PHYSICAL EXAM: Overall appearance:  Under general anesthesia on the operating table with LEFT mid neck transverse incision. Head:  Not examined Ears: not examined Nose:  Not examined Oral Cavity:  Not examined Oral Pharynx/Hypopharynx/Larynx:  Not examined Neuro:  Not examined Neck:  Not examined  Assessment/Plan Esophageal tear, intra-operative  Will explore pharynx and consider closing tear.    Will need NG feeding and npo for 7 days.  Wound drainage x 4-5 days depending on amt and character of drainage.  IV or NG antibiosis x 5 days.    Check KUB before feeding.  OK to remove inner stylet from NG tube if good position.    [name] 64/04/5828, 12:45 PM

## 2015-03-03 NOTE — Progress Notes (Signed)
After giving report to charge nurse on the floor- pcharge nurse, DD, [facility] feels that patient would be more closely monitored on a stepdown unit for 24 hours due to her high risk for hematoma post op. Dr Louanne Skye notified and order to transfer patient to stepdown placed. Beth charge on [employer] aware and waiting for bed avaliability  On stepdown. Will move patient when bed becomes available.

## 2015-03-03 NOTE — Progress Notes (Addendum)
UR COMPLETED  

## 2015-03-03 NOTE — Progress Notes (Signed)
Patient transferred from 5N by 5N RN and rapid response RN. Patient oriented to unit and room, instructed on callbell and placed at side. Belongings sent with patient. Family at bedside.

## 2015-03-03 NOTE — H&P (Signed)
Carolyn [name] is an 49 y.o. female.   CHIEF COMPLAINT:  Neck pain, intermittent radiation left upper extremity.  Difficulty using her hands for manipulating buttons and doing fine manipulation.  Difficulties with gait, some intermittent bowel/bladder concerns over the last several months.    HISTORY OF PRESENT ILLNESS:  Patient is a 49 year old female.  She is status post removal of exostoses over the anterior aspect of the cervical spine at C2-3, 3-4 and C4-5.  That surgery done in April of 2014.  Prior to the surgery, noted to have some very mild cervical stenosis findings at C3-4.  She had no clinical signs of myelopathy or radiculopathy prior to intervention.  Had excision of the osteophytes anteriorly over these segments.  Had a clinical course complicated by some difficulties with dysphagia postoperatively, but overall went on to heal her incision site.  Continues with some amount of range of motion of her cervical spine, which she has been pleased with as she has had almost no range of motion of her neck for several years due to these large osteophytes.  Primary reason for resection was problems with dysphagia and she was developing some problems with orthopnea, difficulties with breathing in certain positions.  Since surgery, she was seen by Dr. Erik Obey, ENT specialist because of some swallowing.  He noted that her esophagus and a visualization of her throat did not show any significant condition that would warrant further intervention.  She had noticed some intermittent numbness in her hands with tingling, paresthesias prior to surgery.  Again, clinically no signs of myelopathy or radiculopathy.  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  Some intermittent complaints of discomfort in the left arm.      Past Medical History  Diagnosis Date  . Hypertension     takes Hyzaar daily  . Neck pain     bone spur on esophagus  . Headache(784.0)     MRI in 2007 bc of headaches  . Arthritis     patient unsure  . Urinary frequency   . Heart murmur     New diagnosis  . Dry eyes     uses Restasis bid  . Depression     but doesn't require any meds  . Diffuse idiopathic skeletal hyperostosis   . PONV (postoperative nausea and vomiting)     nausea and vomiting   . Anxiety   . GERD (gastroesophageal reflux disease)     Past Surgical History  Procedure Laterality Date  . Abdominal hysterectomy  2011    bleeding  . Anterior cervical decomp/discectomy fusion N/A 08/21/2012    Procedure: Excision of exostosis C2-3,C3-4, C4-5;  Surgeon: Jessy Oto, MD;  Location: [facility];  Service: Orthopedics;  Laterality: N/A;    Family History  Problem Relation Age of Onset  . Hypertension    . Heart murmur Father   . Hypertension Father   . Hypertension Mother   . Cancer Paternal Aunt     uterine  . Cancer Maternal Grandmother     lung  . Cancer Maternal Grandfather     stomach  . Cancer Paternal Grandmother     lung   Social History:  reports that she has never smoked. She  does not have any smokeless tobacco history on file. She reports that she drinks alcohol. She reports that she does not use illicit drugs.  Allergies: No Known Allergies  Medications Prior to Admission  Medication Sig Dispense Refill  . acetaminophen (TYLENOL) 500 MG tablet Take 1,000 mg by mouth every 8 (eight) hours as needed for mild pain or moderate pain.    . cycloSPORINE (RESTASIS) 0.05 % ophthalmic emulsion Place 1 drop into both eyes 2 (two) times daily.    [name] esomeprazole (NEXIUM) 20 MG capsule Take 20 mg by mouth daily as needed.     [name] losartan-hydrochlorothiazide (HYZAAR) 100-25 MG per tablet Take 1 tablet by mouth daily.      No results  found for this or any previous visit (from the past 48 hour(s)). No results found.  Review of Systems  Constitutional: Negative.   Respiratory: Negative.   Cardiovascular: Negative.   Gastrointestinal: Negative.   Genitourinary: Negative.   Musculoskeletal: Positive for neck pain.  Psychiatric/Behavioral: Negative.     Blood pressure 171/74, pulse 73, temperature 98 F (36.7 C), temperature source Oral, resp. rate 20, weight 94.348 kg (208 lb), SpO2 100 %. Physical Exam  Constitutional: She is oriented to person, place, and time. No distress.  HENT:  Head: Atraumatic.  Eyes: EOM are normal.  Respiratory: No respiratory distress.  GI: She exhibits no distension.  Musculoskeletal: She exhibits tenderness.  Neurological: She is alert and oriented to person, place, and time.  Skin: Skin is warm and dry.  Psychiatric: She has a normal mood and affect.    RADIOGRAPHS:  Followup MRI scan was obtained in August that showed some compression of the thecal sac at the C3-4 level.  Did not describe any significant cord compression, feeling that this was mild when reviewing the study.  Because of the significant findings and concern possible myelopathy or central component to both dysphagia and problems of Hoffmann sign in her arms, I recommended that she be seen by neurology and she was evaluated by Dr. Leta Baptist who went ahead and ordered MRI scan of both her brain and her cervical spine.  That was done just last week.  I have reviewed this study itself.  It shows that at the C3-4 level, disk protrusion here seemingly has worsened to where she now has significant cord compression of the diameter of the thecal sac from anterior to posterior measured at about 6.9 mm.  Prior to that, she was in the 7 range.  There are no cord changes that are apparent on the study, but there is concern here for cervical stenosis as a source of her increasing symptoms into her upper extremities.  She is also found to have  biforaminal narrowing at the C5-6 level, left sided foraminal narrowing at C4-5, which is felt to be mild, certainly the findings of cervical stenosis here are more impressive than the foraminal narrowing seen at other segments.  The C5-6 level in particular still has coalescing anterior syndesmophytes that were not resected with her surgical intervention and likely present her from having more substantial symptoms associated with the 5-6 level.  The 4-5 level has foraminal narrowing on the left side, potentially could effect the left C5 root, but does not explain the Hoffmann sign on the right side and problems with bilaterality of her symptoms in both upper and lower extremities.  She is also having pain complaints into her right foot.  The problem is feeling as though she could barely push with the  leg when she needs to, which may be a sign also of problems of stereotaxis and long tract concerns regarding the lower extremities and some mild ataxia.  I examined the patient's MRI scan,  examined her today.    PHYSICAL EXAMINATION:  She shows rather severe Hoffmann sign here, right side, left side.  Lower extremity reflexes are hyperreflexia at the knees and at the ankles.  No clonus though.  No deficit spasticity here.      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    PLAN:  I think the concerning problem is C3-4 where she has what appears to be severe cervical stenosis at 6.9 mm.  She is not showing core changes, so the hope is that we may be able to see some return of function with the resection of the osteophytes and decompression at C3-4.  This is the area of previous surgery and it will involve dissection then of the area of previous scar tissue in order to perform the anterior discectomy and fusion with excision of the osteophytes posteriorly at C3-4.  This would include partial vertebrectomy, both the inferior portion at C3, superior portion  of C4 and bone grafting then at that point.  If she does have some very mild myelopathy, myelopathic changes, I think intraoperative monitoring including motor evoked potentials and sensory evoked potentials to assess for any intraoperative changes while performing this surgery.  The risks of surgery including risk of infection, bleeding, neurologic compromise discussed with Ms. Zapata and she understands.  Will go ahead and proceed with surgery. OWENS,Aaima Gaddie M 03/03/2015, 6:59 AM

## 2015-03-03 NOTE — Anesthesia Postprocedure Evaluation (Signed)
  Anesthesia Post-op Note  Patient: Carolyn [name]  Procedure(s) Performed: Procedure(s): ANTERIOR CERVICAL DISCECTOMY AND FUSION WITH PARTIAL VERTEBRECTOMY C3 AND C4, CERVICAL PLATE AND SCREWS, ALLOGRAFT, LOCAL BONE GRAFT, VIVIGEN (N/A) DIRECT LARYNGOSCOPY AND PLACEMENT OF FEEDING TUBE REPAIR OF ESOPHAGUS  Patient Location: PACU  Anesthesia Type:General  Level of Consciousness: awake and alert   Airway and Oxygen Therapy: Patient Spontanous Breathing  Post-op Pain: moderate  Post-op Assessment: Post-op Vital signs reviewed              Post-op Vital Signs: Reviewed  Last Vitals:  Filed Vitals:   03/03/15 1505  BP: 119/67  Pulse: 76  Temp:   Resp: 18    Complications: No apparent anesthesia complications

## 2015-03-04 ENCOUNTER — Encounter (HOSPITAL_COMMUNITY): Payer: Self-pay | Admitting: Specialist

## 2015-03-04 DIAGNOSIS — S1121XA Laceration without foreign body of pharynx and cervical esophagus, initial encounter: Secondary | ICD-10-CM | POA: Diagnosis not present

## 2015-03-04 HISTORY — DX: Laceration without foreign body of pharynx and cervical esophagus, initial encounter: S11.21XA

## 2015-03-04 MED ORDER — JEVITY 1.2 CAL PO LIQD: 1000.0000 mL | ORAL | Status: DC

## 2015-03-04 MED ORDER — OSMOLITE 1.2 CAL PO LIQD
1000.0000 mL | ORAL | Status: DC
  Administered 2015-03-04 – 2015-03-06 (×3): 1000 mL
  Filled 2015-03-04 (×13): qty 1000

## 2015-03-04 MED ORDER — PRO-STAT SUGAR FREE PO LIQD
30.0000 mL | Freq: Two times a day (BID) | ORAL | Status: DC
  Administered 2015-03-04 – 2015-03-06 (×4): 30 mL
  Filled 2015-03-04 (×4): qty 30

## 2015-03-04 NOTE — Care Management Note (Signed)
Case Management Note  Patient Details  Name: LONNA RABOLD MRN: [id number] Date of Birth: 1965/12/05  Subjective/Objective:                 Admitted with a herniated C3-4 disk, S/P anterior cervical discectomy and fusion, direct laryngoscopy, repair of esophagus, and placement of feeding tube on 11/8. Independent with ADL's prior to admit. No DME usage. From home with family.   Action/Plan:   Return to home when medically stable. CM to f/u with disposition needs.  Expected Discharge Date:                  Expected Discharge Plan:  Home/Self Care  In-House Referral:     Discharge planning Services  CM Consult  Post Acute Care Choice:    Choice offered to:     DME Arranged:    DME Agency:     HH Arranged:    HH Agency:     Status of Service:  In process, will continue to follow  Medicare Important Message Given:    Date Medicare IM Given:    Medicare IM give by:    Date Additional Medicare IM Given:    Additional Medicare Important Message give by:     If discussed at [place] of Stay Meetings, dates discussed:    Additional Comments:  Zykerria Tanton (Spouse)  [phone]  Sharin Mons, RN,BSN.CM [phone] 03/04/2015, 10:56 AM

## 2015-03-04 NOTE — Evaluation (Signed)
Physical Therapy Evaluation Patient Details Name: Carolyn [name] MRN: [id number] DOB: 1965-11-30 Today's Date: 03/04/2015   History of Present Illness  pt presents with C3-4 ACDF and Esophageal tear requiring repair and NG tube placement.  pt with hx of HTN, Depression, Anxiety, and previous Cervical Surgeries.    Clinical Impression  Pt very painful in her throat and generally weak.  Pt able to ambulate and discussed continued ambulation with Nsg staff.  Feel pt will make good progress to D/C to home with family support.  Will continue to follow.      Follow Up Recommendations No PT follow up;Supervision - Intermittent    Equipment Recommendations  None recommended by PT    Recommendations for Other Services       Precautions / Restrictions Precautions Precautions: Cervical Required Braces or Orthoses: Cervical Brace Cervical Brace: Soft collar;At all times Restrictions Weight Bearing Restrictions: No      Mobility  Bed Mobility Overal bed mobility: Needs Assistance Bed Mobility: Rolling;Sidelying to Sit Rolling: Supervision Sidelying to sit: Supervision;HOB elevated       General bed mobility comments: No physical A needed, but definite use of UEs and bed rail.    Transfers Overall transfer level: Needs assistance Equipment used: None Transfers: Sit to/from Stand Sit to Stand: Min guard         General transfer comment: pt moves slowly and clearly labored from pain.  pt needs cues for UE use and safe technique.    Ambulation/Gait Ambulation/Gait assistance: Min guard Ambulation Distance (Feet): 160 Feet Assistive device: Rolling walker (2 wheeled) (IV pole) Gait Pattern/deviations: Step-through pattern;Decreased stride length     General Gait Details: pt moves slowly and initially used RW, but then transitioned to one UE on the IV pole.  pt fatigued after ambulation.    Stairs            Wheelchair Mobility    Modified Rankin (Stroke Patients  Only)       Balance Overall balance assessment: Needs assistance Sitting-balance support: No upper extremity supported;Feet supported Sitting balance-Leahy Scale: Fair     Standing balance support: No upper extremity supported;During functional activity Standing balance-Leahy Scale: Fair                               Pertinent Vitals/Pain Pain Assessment: 0-10 Pain Score: 8  Pain Location: Throat Pain Descriptors / Indicators: Sore Pain Intervention(s): Monitored during session;Repositioned    Home Living Family/patient expects to be discharged to:: Private residence Living Arrangements: Spouse/significant other;Children Available Help at Discharge: Family;Available 24 hours/day (Initially spouse can stay home with pt.  ) Type of Home: House Home Access: Stairs to enter Entrance Stairs-Rails: None Entrance Stairs-Number of Steps: 3 Home Layout: One level Home Equipment: None      Prior Function Level of Independence: Independent               Hand Dominance        Extremity/Trunk Assessment   Upper Extremity Assessment: Defer to OT evaluation           Lower Extremity Assessment: Generalized weakness         Communication   Communication:  (Hoarse whisper)  Cognition Arousal/Alertness: Awake/alert Behavior During Therapy: WFL for tasks assessed/performed Overall Cognitive Status: Within Functional Limits for tasks assessed  General Comments      Exercises        Assessment/Plan    PT Assessment Patient needs continued PT services  PT Diagnosis Difficulty walking;Acute pain   PT Problem List Decreased strength;Decreased activity tolerance;Decreased balance;Decreased mobility;Decreased coordination;Decreased knowledge of use of DME;Pain  PT Treatment Interventions DME instruction;Gait training;Stair training;Functional mobility training;Therapeutic activities;Therapeutic exercise;Balance  training;Patient/family education   PT Goals (Current goals can be found in the Care Plan section) Acute Rehab PT Goals Patient Stated Goal: Eat. PT Goal Formulation: With patient Time For Goal Achievement: 03/11/15 Potential to Achieve Goals: Good    Frequency Min 5X/week   Barriers to discharge        Co-evaluation               End of Session Equipment Utilized During Treatment: Gait belt;Cervical collar Activity Tolerance: Patient limited by fatigue;Patient limited by pain Patient left: in chair;with call bell/phone within reach;with family/visitor present Nurse Communication: Mobility status         Time: 1314-1340 PT Time Calculation (min) (ACUTE ONLY): 26 min   Charges:   PT Evaluation $Initial PT Evaluation Tier I: 1 Procedure PT Treatments $Gait Training: 8-22 mins   PT G CodesCatarina Hartshorn, [place] 03/04/2015, 2:24 PM

## 2015-03-04 NOTE — Progress Notes (Signed)
Occupational Therapy Evaluation Patient Details Name: Carolyn [name] MRN: [id number] DOB: 06/07/1965 Today's Date: 03/04/2015    History of Present Illness pt presents with C3-4 ACDF and Esophageal tear requiring repair and NG tube placement.  pt with hx of HTN, Depression, Anxiety, and previous Cervical Surgeries.     Clinical Impression   PTA, pt independent with all ADL and mobility. Pt limited by pain at this time. Pt with BUE weakness. Will estabish a HEP for increasing B hand strength and issue tubing to assist with holding objects as needed. After D/C, pt will benefit from outpt OT to increase BUE strength/coordination once cleared by Dr. Louanne Skye.    Follow Up Recommendations  Other (comment);Outpatient OT (as directed by Dr. Louanne Skye)    Equipment Recommendations  None recommended by OT    Recommendations for Other Services       Precautions / Restrictions Precautions Precautions: Cervical Required Braces or Orthoses: Cervical Brace Cervical Brace: Soft collar;At all times (has [place] collar for shower) Restrictions Weight Bearing Restrictions: No      Mobility Bed Mobility        General bed mobility comments: OOB in chair  Transfers          General transfer comment: limited by pain    Balance Overall balance assessment: Needs assistance Sitting-balance support: No upper extremity supported;Feet supported Sitting balance-Leahy Scale: Fair     Standing balance support: No upper extremity supported;During functional activity Standing balance-Leahy Scale: Fair                              ADL Overall ADL's : Needs assistance/impaired Eating/Feeding: NPO                                     General ADL Comments: Educated pt/family on compensatory technqiues for ADL. Recommended pt get reacher for retrieving items. Given cervical precautions sheet adn reviewed information. Husband states he will assist with ADL as  needed.      Vision     Perception     Praxis      Pertinent Vitals/Pain Pain Assessment: 0-10 Pain Score: 10-Worst pain ever Pain Location: throat/neck Pain Descriptors / Indicators: Aching;Discomfort;Sharp;Sore Pain Intervention(s): Limited activity within patient's tolerance;RN gave pain meds during session     Hand Dominance     Extremity/Trunk Assessment Upper Extremity Assessment Upper Extremity Assessment: RUE deficits/detail;LUE deficits/detail RUE Deficits / Details: generalized weakness. Pt unable to lift R shoulder at this time beyond 20 degrees. ? PTA states she had difficulty with R shoulder. grip strength @ 3+/5. states she drops things frequently RUE Sensation:  (c/o numbness) RUE Coordination: decreased fine motor;decreased gross motor LUE Deficits / Details: generalized weakness. $/% LUE Sensation:  (c/o numbness) LUE Coordination: decreased gross motor   Lower Extremity Assessment Lower Extremity Assessment: Defer to PT evaluation   Cervical / Trunk Assessment Cervical / Trunk Assessment: Other exceptions (cervical collar)   Communication Communication Communication:  (Hoarse whisper)   Cognition Arousal/Alertness: Awake/alert Behavior During Therapy: WFL for tasks assessed/performed Overall Cognitive Status: Within Functional Limits for tasks assessed                     General Comments       Exercises       Shoulder Instructions      Home Living Family/patient expects to  be discharged to:: Private residence Living Arrangements: Spouse/significant other;Children Available Help at Discharge: Family;Available 24 hours/day (Initially spouse can stay home with pt.  ) Type of Home: House Home Access: Stairs to enter [employer] of Steps: 3 Entrance Stairs-Rails: None Home Layout: One level     Bathroom Shower/Tub: Tub/shower unit;Walk-in shower   Bathroom Toilet: Standard     Home Equipment: None           Prior Functioning/Environment Level of Independence: Independent             OT Diagnosis: Generalized weakness;Acute pain   OT Problem List: Decreased strength;Decreased range of motion;Decreased activity tolerance;Decreased coordination;Decreased knowledge of precautions;Impaired sensation;Impaired UE functional use;Pain   OT Treatment/Interventions: Self-care/ADL training;Therapeutic exercise;DME and/or AE instruction;Therapeutic activities;Patient/family education    OT Goals(Current goals can be found in the care plan section) Acute Rehab OT Goals Patient Stated Goal: Eat. OT Goal Formulation: With patient Time For Goal Achievement: 03/11/15 Potential to Achieve Goals: Good ADL Goals Pt/caregiver will Perform Home Exercise Program: Increased strength;With theraputty;Both right and left upper extremity;With written HEP provided Additional ADL Goal #1: Pt will verbalize understadning of cervical precautions for ADL  OT Frequency: Min 2X/week   Barriers to D/C:            Co-evaluation              End of Session Equipment Utilized During Treatment: Cervical collar Nurse Communication: Mobility status;Precautions;Weight bearing status  Activity Tolerance: Patient tolerated treatment well Patient left: in chair;with call bell/phone within reach;with family/visitor present;with nursing/sitter in room   Time: 1400-1415 OT Time Calculation (min): 15 min Charges:  OT General Charges $OT Visit: 1 Procedure OT Evaluation $Initial OT Evaluation Tier I: 1 Procedure G-Codes:    Carolyn [name],HILLARY 03-13-15, 2:30 PM   [facility], OTR/L  [phone] 2015-03-13

## 2015-03-04 NOTE — Progress Notes (Signed)
Initial Nutrition Assessment  DOCUMENTATION CODES:   Obesity unspecified  INTERVENTION:    Initiate TF via NGT with Osmolite 1.2 at 20 ml/h, increase by 10 ml every 4 hours to goal rate of 60 ml/h with Prostat 30 ml BID to provide 1928 kcals, 110 gm protein, 1181 ml free water daily.  NUTRITION DIAGNOSIS:   Inadequate oral intake related to inability to eat as evidenced by NPO status.  GOAL:   Patient will meet greater than or equal to 90% of their needs  MONITOR:   TF tolerance, Weight trends, Labs, I & O's  REASON FOR ASSESSMENT:   Consult Enteral/tube feeding initiation and management  ASSESSMENT:   49 yo wf pt of Dr. Louanne Skye underwent removal of cervical osteophytes several years back. Recently sustained a herniated C3-4 disk. During repair, Dr. Louanne Skye noted esophageal tear.  Patient reports good appetite and good intake PTA, no nutrition problems identified. Nutrition focused physical exam completed.  No muscle or subcutaneous fat depletion noticed. She is S/P anterior cervical discectomy and fusion, direct laryngoscopy, repair of esophagus, and placement of feeding tube on 11/8. Patient to remain NPO for 6 more days. Received MD Consult for TF initiation and management. Small bore NGT in place with tip in the stomach.   Diet Order:  Diet NPO time specified  Skin:  Reviewed, no issues  Last BM:  11/7  Height:   Ht Readings from Last 1 Encounters:  03/03/15 5\' 8"  (1.727 m)    Weight:   Wt Readings from Last 1 Encounters:  03/03/15 208 lb 12.4 oz (94.7 kg)    Ideal Body Weight:  63.6 kg  BMI:  Body mass index is 31.75 kg/(m^2).  Estimated Nutritional Needs:   Kcal:  5830-9407  Protein:  95-115 gm  Fluid:  1.8-2 L  EDUCATION NEEDS:   No education needs identified at this time  Molli Barrows, [place], [place], [place] Pager [phone] After Hours Pager [phone]

## 2015-03-04 NOTE — Progress Notes (Addendum)
     Subjective: 1 Day Post-Op Procedure(s) (LRB): ANTERIOR CERVICAL DISCECTOMY AND FUSION WITH PARTIAL VERTEBRECTOMY C3 AND C4, CERVICAL PLATE AND SCREWS, ALLOGRAFT, LOCAL BONE GRAFT, VIVIGEN (N/A) DIRECT LARYNGOSCOPY AND PLACEMENT OF FEEDING TUBE REPAIR OF ESOPHAGUS Awake, alert and oriented x 4. Throat is sore, some nausea, no emesis. No chills Patient reports pain as moderate.    Objective:   VITALS:  Temp:  [97.6 F (36.4 C)-98.9 F (37.2 C)] 98.3 F (36.8 C) (11/09 0406) Pulse Rate:  [68-105] 78 (11/09 0410) Resp:  [11-29] 21 (11/09 0410) BP: (102-147)/(50-96) 112/64 mmHg (11/09 0410) SpO2:  [93 %-100 %] 99 % (11/09 0410) Weight:  [94.7 kg (208 lb 12.4 oz)] 94.7 kg (208 lb 12.4 oz) (11/08 1640)  Neurologically intact ABD soft Sensation intact distally Intact pulses distally Dorsiflexion/Plantar flexion intact Incision: dressing C/D/I Hoffman's sign positive as with preop Motor normal.  LABS No results for input(s): HGB, WBC, PLT in the last 72 hours. No results for input(s): NA, K, CL, CO2, BUN, CREATININE, GLUCOSE in the last 72 hours. No results for input(s): LABPT, INR in the last 72 hours.   Assessment/Plan: 1 Day Post-Op Procedure(s) (LRB): ANTERIOR CERVICAL DISCECTOMY AND FUSION WITH PARTIAL VERTEBRECTOMY C3 AND C4, CERVICAL PLATE AND SCREWS, ALLOGRAFT, LOCAL BONE GRAFT, VIVIGEN (N/A) DIRECT LARYNGOSCOPY AND PLACEMENT OF FEEDING TUBE REPAIR OF ESOPHAGUS  Up with therapy Continue ABX therapy due to esophageal tear and risk of infection. May be out of bed and may bath lower body shoulders down Due to itching. Check CBC, BMET in AM  Foley discontinued. Continue IVF patient will be NPO for 6 more days. Carolyn [name] E 03/04/2015, 8:06 AM

## 2015-03-04 NOTE — Progress Notes (Signed)
Report called to RN on [facility]. Family at bedside and aware of pt transfer.

## 2015-03-05 ENCOUNTER — Inpatient Hospital Stay (HOSPITAL_COMMUNITY): Payer: BC Managed Care – PPO

## 2015-03-05 DIAGNOSIS — R600 Localized edema: Secondary | ICD-10-CM

## 2015-03-05 DIAGNOSIS — R05 Cough: Secondary | ICD-10-CM | POA: Insufficient documentation

## 2015-03-05 DIAGNOSIS — Z8249 Family history of ischemic heart disease and other diseases of the circulatory system: Secondary | ICD-10-CM

## 2015-03-05 DIAGNOSIS — J189 Pneumonia, unspecified organism: Secondary | ICD-10-CM

## 2015-03-05 DIAGNOSIS — I872 Venous insufficiency (chronic) (peripheral): Secondary | ICD-10-CM | POA: Diagnosis present

## 2015-03-05 DIAGNOSIS — R059 Cough, unspecified: Secondary | ICD-10-CM | POA: Insufficient documentation

## 2015-03-05 HISTORY — DX: Pneumonia, unspecified organism: J18.9

## 2015-03-05 LAB — CBC WITH DIFFERENTIAL/PLATELET
BASOS ABS: 0 10*3/uL (ref 0.0–0.1)
Basophils Relative: 0 %
EOS ABS: 0.1 10*3/uL (ref 0.0–0.7)
EOS PCT: 0 %
HCT: 35.9 % — ABNORMAL LOW (ref 36.0–46.0)
HEMOGLOBIN: 12.5 g/dL (ref 12.0–15.0)
LYMPHS ABS: 1.8 10*3/uL (ref 0.7–4.0)
LYMPHS PCT: 12 %
MCH: 30.7 pg (ref 26.0–34.0)
MCHC: 34.8 g/dL (ref 30.0–36.0)
MCV: 88.2 fL (ref 78.0–100.0)
Monocytes Absolute: 1.5 10*3/uL — ABNORMAL HIGH (ref 0.1–1.0)
Monocytes Relative: 10 %
NEUTROS ABS: 11.8 10*3/uL — AB (ref 1.7–7.7)
Neutrophils Relative %: 78 %
PLATELETS: 214 10*3/uL (ref 150–400)
RBC: 4.07 MIL/uL (ref 3.87–5.11)
RDW: 14 % (ref 11.5–15.5)
WBC: 15.1 10*3/uL — AB (ref 4.0–10.5)

## 2015-03-05 LAB — BASIC METABOLIC PANEL
ANION GAP: 8 (ref 5–15)
BUN: 11 mg/dL (ref 6–20)
CHLORIDE: 99 mmol/L — AB (ref 101–111)
CO2: 28 mmol/L (ref 22–32)
Calcium: 8.7 mg/dL — ABNORMAL LOW (ref 8.9–10.3)
Creatinine, Ser: 0.76 mg/dL (ref 0.44–1.00)
GFR calc Af Amer: >60 mL/min (ref >60)
Glucose, Bld: 105 mg/dL — ABNORMAL HIGH (ref 65–99)
POTASSIUM: 3.8 mmol/L (ref 3.5–5.1)
SODIUM: 135 mmol/L (ref 135–145)

## 2015-03-05 LAB — EXPECTORATED SPUTUM ASSESSMENT W REFEX TO RESP CULTURE

## 2015-03-05 LAB — STREP PNEUMONIAE URINARY ANTIGEN: Strep Pneumo Urinary Antigen: NEGATIVE

## 2015-03-05 LAB — GLUCOSE, CAPILLARY
GLUCOSE-CAPILLARY: 130 mg/dL — AB (ref 65–99)
GLUCOSE-CAPILLARY: 101 mg/dL — AB (ref 65–99)
GLUCOSE-CAPILLARY: 118 mg/dL — AB (ref 65–99)
Glucose-Capillary: 129 mg/dL — ABNORMAL HIGH (ref 65–99)

## 2015-03-05 LAB — EXPECTORATED SPUTUM ASSESSMENT W GRAM STAIN, RFLX TO RESP C

## 2015-03-05 LAB — BRAIN NATRIURETIC PEPTIDE: B NATRIURETIC PEPTIDE 5: 63.3 pg/mL (ref 0.0–100.0)

## 2015-03-05 MED ORDER — GUAIFENESIN-DM 100-10 MG/5ML PO SYRP: 5.0000 mL | ORAL_SOLUTION | ORAL | Status: DC | PRN

## 2015-03-05 MED ORDER — PIPERACILLIN-TAZOBACTAM 3.375 G IVPB
3.3750 g | Freq: Three times a day (TID) | INTRAVENOUS | Status: DC
  Administered 2015-03-05 – 2015-03-13 (×24): 3.375 g via INTRAVENOUS
  Filled 2015-03-05 (×24): qty 50

## 2015-03-05 MED ORDER — FUROSEMIDE 10 MG/ML IJ SOLN
40.0000 mg | Freq: Once | INTRAMUSCULAR | Status: AC
  Administered 2015-03-05: 40 mg via INTRAVENOUS
  Filled 2015-03-05: qty 4

## 2015-03-05 MED ORDER — PHENOL 1.4 % MT LIQD: 1.0000 | OROMUCOSAL | Status: DC | PRN

## 2015-03-05 MED ORDER — POTASSIUM CHLORIDE 20 MEQ PO PACK: 40.0000 meq | PACK | Freq: Once | ORAL | Status: DC

## 2015-03-05 MED ORDER — GUAIFENESIN 100 MG/5ML PO SOLN
15.0000 mL | Freq: Four times a day (QID) | ORAL | Status: DC | PRN
  Filled 2015-03-05: qty 10

## 2015-03-05 MED ORDER — VANCOMYCIN HCL IN DEXTROSE 1-5 GM/200ML-% IV SOLN
1000.0000 mg | Freq: Three times a day (TID) | INTRAVENOUS | Status: DC
  Administered 2015-03-05 – 2015-03-09 (×13): 1000 mg via INTRAVENOUS
  Filled 2015-03-05 (×15): qty 200

## 2015-03-05 MED ORDER — PIPERACILLIN-TAZOBACTAM 3.375 G IVPB 30 MIN: 3.3750 g | Freq: Three times a day (TID) | INTRAVENOUS | Status: DC

## 2015-03-05 MED ORDER — POTASSIUM CHLORIDE 20 MEQ/15ML (10%) PO SOLN
40.0000 meq | Freq: Once | ORAL | Status: AC
  Administered 2015-03-05: 40 meq
  Filled 2015-03-05: qty 30

## 2015-03-05 NOTE — Progress Notes (Signed)
     Subjective: 2 Days Post-Op Procedure(s) (LRB): ANTERIOR CERVICAL DISCECTOMY AND FUSION WITH PARTIAL VERTEBRECTOMY C3 AND C4, CERVICAL PLATE AND SCREWS, ALLOGRAFT, LOCAL BONE GRAFT, VIVIGEN (N/A) DIRECT LARYNGOSCOPY AND PLACEMENT OF FEEDING TUBE REPAIR OF ESOPHAGUS  Productive cough worsening, sore throat. No chills last evening. Placed on O2 at night.  Patient reports pain as moderate.    Objective:   VITALS:  Temp:  [97.5 F (36.4 C)-100.5 F (38.1 C)] 100.5 F (38.1 C) (11/10 1736) Pulse Rate:  [71-97] 76 (11/10 1736) Resp:  [18] 18 (11/10 1736) BP: (104-124)/(52-101) 114/52 mmHg (11/10 1736) SpO2:  [88 %-98 %] 95 % (11/10 1736)  Neurologically intact ABD soft Neurovascular intact Intact pulses distally Dorsiflexion/Plantar flexion intact Incision: moderate drainage dressing changed, penrose drain advanced today. thin bloody drainage on dressing.   LABS  Recent Labs  03/05/15 0521  HGB 12.5  WBC 15.1*  PLT 214    Recent Labs  03/05/15 0521  NA 135  K 3.8  CL 99*  CO2 28  BUN 11  CREATININE 0.76  GLUCOSE 105*   No results for input(s): LABPT, INR in the last 72 hours. CXR shows LLL pneumonia.  Assessment/Plan: 2 Days Post-Op Procedure(s) (LRB): ANTERIOR CERVICAL DISCECTOMY AND FUSION WITH PARTIAL VERTEBRECTOMY C3 AND C4, CERVICAL PLATE AND SCREWS, ALLOGRAFT, LOCAL BONE GRAFT, VIVIGEN (N/A) DIRECT LARYNGOSCOPY AND PLACEMENT OF FEEDING TUBE REPAIR OF ESOPHAGUS  Left lower lobe pneumonia. Esophageal tear supraglottic, left pryiformis sinus.   Up with therapy D/C IV fluids Continue ABX therapy due to esophageal tear, POD#2. Total of 5 days antibiotics recommended. Internal medicine consulted for left lower lobe pneumonia.  Sharmaine Bain E 03/05/2015, 7:04 PM

## 2015-03-05 NOTE — Consult Note (Signed)
[facility] Consultation note                                                                                    Carolyn [name], is a 49 y.o. female  MRN: [id number]   DOB - Sep 30, 1965  Admit Date - 03/03/2015  Outpatient Primary MD for the patient is Rory Percy, MD  Requesting MD: Louanne Skye / Orthopedics  Reason for consultation: Suspected pneumonia  With History of -  Past Medical History  Diagnosis Date  . Hypertension     takes Hyzaar daily  . Neck pain     bone spur on esophagus  . Headache(784.0)     MRI in 2007 bc of headaches  . Arthritis     patient unsure  . Urinary frequency   . Heart murmur     New diagnosis  . Dry eyes     uses Restasis bid  . Depression     but doesn't require any meds  . Diffuse idiopathic skeletal hyperostosis   . PONV (postoperative nausea and vomiting)     nausea and vomiting   . Anxiety   . GERD (gastroesophageal reflux disease)       Past Surgical History  Procedure Laterality Date  . Abdominal hysterectomy  2011    bleeding  . Anterior cervical decomp/discectomy fusion N/A 08/21/2012    Procedure: Excision of exostosis C2-3,C3-4, C4-5;  Surgeon: Jessy Oto, MD;  Location: [place];  Service: Orthopedics;  Laterality: N/A;  . Anterior cervical decomp/discectomy fusion N/A 03/03/2015    Procedure: ANTERIOR CERVICAL DISCECTOMY AND FUSION WITH PARTIAL VERTEBRECTOMY C3 AND C4, CERVICAL PLATE AND SCREWS, ALLOGRAFT, LOCAL BONE GRAFT, VIVIGEN;  Surgeon: Jessy Oto, MD;  Location: [place];  Service: Orthopedics;  Laterality: N/A;  . Direct laryngoscopy  03/03/2015    Procedure: DIRECT LARYNGOSCOPY AND PLACEMENT OF FEEDING TUBE;  Surgeon: [name], MD;  Location: [place];  Service: ENT;;  . Repair of esophagus  03/03/2015    Procedure: REPAIR OF ESOPHAGUS;  Surgeon: [name], MD;  Location: [place];  Service: ENT;;    in for   No chief complaint on file.    HPI This is a 49 year female patient with history of  hypertension, chronic neck pain related to significant bone spurs, history of heart murmur secondary to trace tricuspid regurgitation, fatty disorder, family history of DVT and patient reports she carries "Gene for blood clots" who was admitted by the orthopedic service to undergo elective cervical decompression surgery. She underwent initial procedure on 11/8 (anterior discectomy C3-4 due to disc rupture) but postoperative course complicated by esophageal tear prompting ENT consultation and operative placement of pain to feeding tube to promote healing. Postoperatively patient has been having issues of upper airway congestion and transient nocturnal hypoxemia. She has not had fever. She does have productive cough of green sputum. She is not hypoxemic otherwise. She does have mild leukocytosis 15,100 but is also 48 hours postop. Review Chest x-ray today revealed left lower lobe pneumonia and trace left pleural effusion there was also mild pulmonary interstitial prominence that appear to be more consistent with interstitial edema. Internal medicine has  been consulted to assist with management of patient's respiratory symptoms.   Review of Systems   In addition to the HPI above,  No Fever-chills, myalgias or other constitutional symptoms No Headache, changes with Vision or hearing, new weakness, tingling, numbness in any extremity, No Chest pain, palpitations, orthopnea or DOE No Abdominal pain, N/V; no melena or hematochezia, no dark tarry stools, Bowel movements are regular, No dysuria, hematuria or flank pain No new skin rashes, lesions, masses or bruises, No new joints pains-aches No recent weight gain or loss No polyuria, polydypsia or polyphagia,  *A full 10 point Review of Systems was done, except as stated above, all other Review of Systems were negative.  Social History Social History  Substance Use Topics  . Smoking status: Never Smoker   . Smokeless tobacco: Not on file  . Alcohol Use:  Yes     Comment: rarely    Resides at: Private residence  Lives with: Spouse  Ambulatory status: w/o Assistive devices   Family History Family History  Problem Relation Age of Onset  . Hypertension    . Heart murmur Father   . Hypertension Father   . Hypertension Mother   . Cancer Paternal Aunt     uterine  . Cancer Maternal Grandmother     lung  . Cancer Maternal Grandfather     stomach  . Cancer Paternal Grandmother     lung     Prior to Admission medications   Medication Sig Start Date End Date Taking? Authorizing Provider  acetaminophen (TYLENOL) 500 MG tablet Take 1,000 mg by mouth every 8 (eight) hours as needed for mild pain or moderate pain.   Yes Historical Provider, MD  cycloSPORINE (RESTASIS) 0.05 % ophthalmic emulsion Place 1 drop into both eyes 2 (two) times daily.   Yes Historical Provider, MD  esomeprazole (NEXIUM) 20 MG capsule Take 20 mg by mouth daily as needed.    Yes Historical Provider, MD  losartan-hydrochlorothiazide (HYZAAR) 100-25 MG per tablet Take 1 tablet by mouth daily.   Yes Historical Provider, MD    No Known Allergies  Physical Exam  Vitals  Blood pressure 104/63, pulse 86, temperature 97.5 F (36.4 C), temperature source Oral, resp. rate 18, height 5\' 8"  (1.727 m), weight 211 lb 6.4 oz (95.89 kg), SpO2 95 %.   General:  In no acute distress, appears acutely ill and somewhat anxious  Psych:  Normal affect, Denies Suicidal or Homicidal ideations, Awake Alert, Oriented X 3. Speech and thought patterns are clear and appropriate, no apparent short term memory deficits  Neuro:   No focal neurological deficits, CN II through XII intact, Strength 5/5 all 4 extremities, Sensation intact all 4 extremities. Soft cervical collar in place postoperatively  ENT:  Ears and Eyes appear Normal, Conjunctivae clear, PER. Moist oral mucosa without erythema or exudates.  Neck:  Supple, No lymphadenopathy appreciated  Respiratory:  Symmetrical chest  wall movement, Good air movement bilaterally, significant upper airway congestion with bilateral expiratory rhonchi. Room Air  Cardiac:  RRR, No Murmurs, no LE edema noted, no JVD, No carotid bruits, peripheral pulses palpable at 2+  Abdomen:  Positive bowel sounds, Soft, Non tender, Non distended,  No masses appreciated, no obvious hepatosplenomegaly; pain to tube in place right near  Skin:  No Cyanosis, Normal Skin Turgor, No Skin Rash or Bruise.  Extremities: Symmetrical without obvious trauma or injury,  no effusions.  Data Review  CBC  Recent Labs Lab 03/05/15 0521  WBC 15.1*  HGB 12.5  HCT 35.9*  PLT 214  MCV 88.2  MCH 30.7  MCHC 34.8  RDW 14.0  LYMPHSABS 1.8  MONOABS 1.5*  EOSABS 0.1  BASOSABS 0.0    Chemistries   Recent Labs Lab 03/05/15 0521  NA 135  K 3.8  CL 99*  CO2 28  GLUCOSE 105*  BUN 11  CREATININE 0.76  CALCIUM 8.7*    estimated creatinine clearance is 103 mL/min (by C-G formula based on Cr of 0.76).  No results for input(s): TSH, T4TOTAL, T3FREE, THYROIDAB in the last 72 hours.  Invalid input(s): FREET3  Coagulation profile No results for input(s): INR, PROTIME in the last 168 hours.  No results for input(s): DDIMER in the last 72 hours.  Cardiac Enzymes No results for input(s): CKMB, TROPONINI, MYOGLOBIN in the last 168 hours.  Invalid input(s): CK  Invalid input(s): POCBNP  Urinalysis    Component Value Date/Time   COLORURINE YELLOW 08/20/2012 0818   APPEARANCEUR CLEAR 08/20/2012 0818   LABSPEC 1.021 08/20/2012 0818   PHURINE 6.5 08/20/2012 0818   GLUCOSEU NEGATIVE 08/20/2012 0818   HGBUR NEGATIVE 08/20/2012 0818   BILIRUBINUR NEGATIVE 08/20/2012 0818   KETONESUR NEGATIVE 08/20/2012 0818   PROTEINUR NEGATIVE 08/20/2012 0818   UROBILINOGEN 0.2 08/20/2012 0818   NITRITE NEGATIVE 08/20/2012 0818   LEUKOCYTESUR NEGATIVE 08/20/2012 0818    Imaging results:   Dg Chest 2 View  03/05/2015  CLINICAL DATA:  Cough and chest  congestion, previous history of esophageal tear EXAM: CHEST  2 VIEW COMPARISON:  PA and lateral chest x-ray of August 20, 2012 FINDINGS: The lungs are adequately inflated. The interstitial markings are mildly increased bilaterally. There is infiltrate in the left lower lobe posterior medially. There is a trace of pleural fluid blunting the left costophrenic angles. The cardiac silhouette is top-normal in size. The pulmonary vascularity is not engorged. The mediastinum is normal in width. A feeding tube is present with the tip lying in the region of the pylorus. The bony thorax exhibits no acute abnormality. IMPRESSION: Left lower lobe pneumonia and trace left pleural effusion. Mild pulmonary interstitial prominence bilaterally may reflect low-grade interstitial edema or less likely interstitial pneumonia. Electronically Signed   By: David  Martinique M.D.   On: 03/05/2015 08:42   Dg Cervical Spine 2 Or 3 Views  03/03/2015  CLINICAL DATA:  C3-4 fusion. EXAM: CERVICAL SPINE - 2-3 VIEW COMPARISON:  MRI cervical spine dated 02/04/2015. FINDINGS: Two cross-table lateral views, intraoperative, show anterior cervical fusion hardware placement at the C3-C4 levels with intervening disc spacer. IMPRESSION: Anterior cervical fusion hardware placement at C3-4. Electronically Signed   By: Franki Cabot M.D.   On: 03/03/2015 10:55   Mr Brain Wo Contrast  02/05/2015  [facility] NEUROIMAGING REPORT STUDY DATE: 02/04/15 PATIENT NAME: MATY MACHUCA DOB: 1965-11-04 MRN: [id number] ORDERING CLINICIAN: Andrey Spearman, MD CLINICAL HISTORY: 49 year old female with upper and lower extremity weakness and hyperreflexia. EXAM: MRI brain (without) TECHNIQUE: MRI of the brain without contrast was obtained utilizing 5 mm axial slices with T1, T2, T2 flair, T2 star gradient echo and diffusion weighted views.  T1 sagittal and T2 coronal views were obtained. CONTRAST: no IMAGING SITE: [facility] [address] (1.5 Tesla  MRI) FINDINGS: No abnormal lesions are seen on diffusion-weighted views to suggest acute ischemia. The cortical sulci, fissures and cisterns are normal in size and appearance. Lateral, third and fourth ventricle are normal in size and appearance. No extra-axial fluid collections are seen. No  evidence of mass effect or midline shift. Subtle left parietal subcortical focus of non-specific gliosis. On sagittal views the posterior fossa, pituitary gland and corpus callosum are unremarkable. No evidence of intracranial hemorrhage on gradient-echo views. The orbits and their contents, paranasal sinuses and calvarium are unremarkable.  Intracranial flow voids are present.   02/05/2015  Equivocal MRI brain (without) demonstrating: 1. Subtle left parietal subcortical focus of non-specific gliosis. 2. No acute findings. INTERPRETING PHYSICIAN: Penni Bombard, MD Certified in Neurology, Neurophysiology and Neuroimaging [facility] [address], [place], [place] [address] [phone]   Mr Cervical Spine Wo Contrast  02/05/2015  [facility] NEUROIMAGING REPORT STUDY DATE: 02/05/15 PATIENT NAME: MAREESA NARA DOB: Oct 16, 1965 MRN: [id number] ORDERING CLINICIAN: Andrey Spearman, MD CLINICAL HISTORY: 49 year old female with upper and lower extremity weakness and hyperreflexia. EXAM: MRI cervical spine (without) TECHNIQUE: MRI of the cervical spine was obtained utilizing 3 mm sagittal slices from the posterior fossa down to the T3-4 level with T1, T2 and inversion recovery views. In addition 4 mm axial slices from [id number] down to T1-2 level were included with T2 and gradient echo views. CONTRAST: no IMAGING SITE: [facility] [address] (1.5 Tesla MRI) FINDINGS: On sagittal views the vertebral bodies have normal height and alignment.  Anterior flowing ossifications from C5 through C7. Anterior spondylosis and bone spurs from C5-6 to T2-3. Disc bulging at C3-4 and C4-5. The  spinal cord is normal in size and appearance. The posterior fossa, pituitary gland and paraspinal soft tissues are unremarkable.  On axial views: C2-3: disc bulging with no spinal stenosis or foraminal narrowing C3-4: disc bulging and facet hypertrophy with moderate spinal stenosis and moderate-severe biforaminal stenosis C4-5: disc bulging and facet hypertrophy with mild spinal stenosis and mild right and moderate left foraminal stenosis C5-6: uncovertebral joint hypertrophy and facet hypertrophy with no spinal stenosis or foraminal narrowing C6-7: uncovertebral joint hypertrophy with no spinal stenosis or foraminal narrowing C7-T1: no spinal stenosis or foraminal narrowing T11-2: no spinal stenosis or foraminal narrowing Limited views of the soft tissues of the head and neck are unremarkable.   02/05/2015  Abnormal MRI cervical spine (without) demonstrating: 1. At C3-4: disc bulging and facet hypertrophy with moderate spinal stenosis and moderate-severe biforaminal stenosis; no cord signal changes. 2. At C4-5: disc bulging and facet hypertrophy with mild spinal stenosis and mild right and moderate left foraminal stenosis; no cord signal changes. 3. Compared to MRI on 12/08/14, spinal stenosis at C3-4 has slightly increased. INTERPRETING PHYSICIAN: Penni Bombard, MD Certified in Neurology, Neurophysiology and Neuroimaging [facility] [address] [place], [place] [address] [phone]   Dg Abd Portable 1v  03/03/2015  CLINICAL DATA:  Feeding catheter placement EXAM: PORTABLE ABDOMEN - 1 VIEW COMPARISON:  None. FINDINGS: Scattered large and small bowel gas is noted. A feeding catheter is noted within the distal stomach directed towards the pyloric channel. No free air is seen. No bony abnormality is noted. IMPRESSION: Feeding catheter within the stomach. Electronically Signed   By: Inez Catalina M.D.   On: 03/03/2015 13:13     EKG: (Independently reviewed) preoperative EKG on  10/31: Sinus bradycardia with ventricular rate 56 bpm, QTC 382 ms, otherwise normal EKG   Assessment & Plan  Principal Problem:   HCAP (healthcare-associated pneumonia) -Discontinue Ancef in favor of Zosyn and vancomycin -Routine pneumonia order sets including checking blood cultures, HIV, urinary strep and Legionella as well as sputum culture -Patient also  has changes on x-rays that appeared to be consistent with volume overload and does have some new bilateral lower extremity edema therefore will challenge with at least 1 dose of Lasix IV 40 mg and monitor for improvement in respiratory symptoms after diuresis -Focus on pulmonary toileting with increasing mobility and continued use of incentive spirometry -Very stable at this juncture and currently not requiring oxygen but will recommend ambulatory pulse oximetry to ensure patient not desaturating with activity -Repeat CBC in a.m.  Active Problems:   Esophageal tear S/P cervical spinal fusion(HCC) -Management per orthopedics team -Suspect need for Panda tube interpreting to some patient's upper airway and oral secretions    Family history DVT  -Patient cannot recall actual Gene deficiency test reports begins with the G -Continue SCDs -Given lower extremity edema recommend transitioning to either subcutaneous heparin or Lovenox when safe from a postoperative standpoint    Bilateral lower extremity edema -Suspect primarily related to volume overload in setting of recent surgical procedures noting most recent echocardiogram 2014 with preserved LV function and no evidence of diastolic dysfunction -Decrease IV fluids to KVO -One-time dose of Lasix as above    Hypertension -Blood pressure well controlled -Home Hyzaar currently on hold    Time spent in minutes : 60      ELLIS,ALLISON L. ANP on 03/05/2015 at 11:56 AM  Between 7am to 7pm - Pager - [phone]  After 7pm go to [email or web address] - password [email or web address]  And look for the  night coverage person covering me after hours  [facility]

## 2015-03-05 NOTE — Progress Notes (Signed)
Occupational Therapy Treatment Patient Details Name: Carolyn [name] MRN: [id number] DOB: [birth year] Today's Date: 03/05/2015    History of present illness pt presents with C3-4 ACDF and Esophageal tear requiring repair and NG tube placement.  pt with hx of HTN, Depression, Anxiety, and previous Cervical Surgeries.     OT comments  Pt met all OT goals this session. Educated and provided pt with theraputty, HEP, and thera tubing; pt return verbalized and demonstrated understanding. Pt unable to recall cervical precautions at beginning of session. Reviewed precautions with pt and educated on precautions during functional activities; pt able to return verbalize at end of session. All education completed and OT goals met, will d/c pt from acute OT services at this time.    Follow Up Recommendations  Other (comment);Outpatient OT (as directed by Dr. Louanne Skye )    Equipment Recommendations  None recommended by OT    Recommendations for Other Services      Precautions / Restrictions Precautions Precautions: Cervical Required Braces or Orthoses: Cervical Brace Cervical Brace: Soft collar;At all times Restrictions Weight Bearing Restrictions: No       Mobility Bed Mobility               General bed mobility comments: Not assessed at this time.  Transfers                 General transfer comment: Not assessed at this time.    Balance                                   ADL                                         General ADL Comments: No family present for OT session. Pt unable to recall cervical precautions; reviewed precuations and explained in relation to ADL activities; pt verbalized understanding. Educated and provided pt with thera tubing for building up utensils and toothbrush when off NPO and ready to begin self feeding again; pt verbalized understanding.       Vision                     Perception     Praxis       Cognition   Behavior During Therapy: WFL for tasks assessed/performed Overall Cognitive Status: Within Functional Limits for tasks assessed                       Extremity/Trunk Assessment               Exercises Other Exercises Other Exercises: Educated and provided pt with yellow theraputty and HEP. Pt able to demonstrate rolling theraputty into a ball, flattening into a pancake, rolling into a log, and pinching with thumb and each finger. Repeated exercises x 5.     Shoulder Instructions       General Comments      Pertinent Vitals/ Pain       Pain Assessment: No/denies pain  Home Living                                          Prior Functioning/Environment  Frequency       Progress Toward Goals  OT Goals(current goals can now be found in the care plan section)  Progress towards OT goals: Goals met/education completed, patient discharged from OT  Acute Rehab OT Goals Patient Stated Goal: to go home OT Goal Formulation: With patient  Plan All goals met and education completed, patient discharged from OT services    Co-evaluation                 End of Session Equipment Utilized During Treatment: Cervical collar   Activity Tolerance Patient tolerated treatment well   Patient Left in bed;with call bell/phone within reach   Nurse Communication          Time: 1000-1017 OT Time Calculation (min): 17 min  Charges: OT General Charges $OT Visit: 1 Procedure OT Treatments $Therapeutic Exercise: 8-22 mins  Binnie Kand M.S., OTR/L Pager: [phone]  03/05/2015, 10:25 AM

## 2015-03-05 NOTE — Progress Notes (Signed)
ANTIBIOTIC CONSULT NOTE - INITIAL  Pharmacy Consult for vanco/Zosyn Indication: pneumonia  No Known Allergies  Patient Measurements: Height: 5\' 8"  (172.7 cm) Weight: 211 lb 6.4 oz (95.89 kg) IBW/kg (Calculated) : 63.9 Adjusted Body Weight:    Vital Signs: Temp: 97.5 F (36.4 C) (11/10 0948) Temp Source: Oral (11/10 0948) BP: 104/63 mmHg (11/10 0948) Pulse Rate: 86 (11/10 0948) Intake/Output from previous day: 11/09 0701 - 11/10 0700 In: 433 [I.V.:378; IV Piggyback:55] Out: 300 [Urine:300] Intake/Output from this shift:    Labs:  Recent Labs  03/05/15 0521  WBC 15.1*  HGB 12.5  PLT 214  CREATININE 0.76   Estimated Creatinine Clearance: 103 mL/min (by C-G formula based on Cr of 0.76). No results for input(s): VANCOTROUGH, VANCOPEAK, VANCORANDOM, GENTTROUGH, GENTPEAK, GENTRANDOM, TOBRATROUGH, TOBRAPEAK, TOBRARND, AMIKACINPEAK, AMIKACINTROU, AMIKACIN in the last 72 hours.    Assessment: Admitted with a herniated C3-4 disk, S/P anterior cervical discectomy and fusion 11/8 with partial vertebrectomy C3/C4, direct laryngoscopy, repair of esophagus, and placement of feeding tube on 11/8  Infectious Disease: Tmax 99.7. WBC 15.1. Previous Ancef for esophageal perforation. Change to Vanco/Zosyn for PNA on CXR. Productive cough. Ancef 11/8>>11/10 Vanco 11/10>> Zosyn 11/10>>  Goal of Therapy:  Vancomycin trough level 15-20 mcg/ml  Plan:  Zosyn 3.375g IV q8hr. Vancomycin 1g IV q8hr Vancomycin trough after 3-5 doses at steady state.   Culver Feighner S. Alford Highland, PharmD, BCPS Clinical Staff Pharmacist Pager [phone]  Eilene Ghazi Stillinger 03/05/2015,12:48 PM

## 2015-03-05 NOTE — Progress Notes (Signed)
Patient's pulse oximetry while ambulating without oxygen was 89-91%. Patient's pulse oximetry while resting in bed without oxygen was 95%.

## 2015-03-05 NOTE — Progress Notes (Signed)
PT Cancellation Note  Patient Details Name: Carolyn [name] MRN: [id number] DOB: 09-27-1965   Cancelled Treatment:    Reason Eval/Treat Not Completed: Patient not medically ready. Awaiting results of bil LE dopplers to rule out DVT   Imre Vecchione 03/05/2015, 4:12 PM  Pager [phone]

## 2015-03-06 ENCOUNTER — Inpatient Hospital Stay (HOSPITAL_COMMUNITY): Payer: BC Managed Care – PPO

## 2015-03-06 ENCOUNTER — Ambulatory Visit (HOSPITAL_COMMUNITY): Payer: BC Managed Care – PPO

## 2015-03-06 DIAGNOSIS — S1121XA Laceration without foreign body of pharynx and cervical esophagus, initial encounter: Secondary | ICD-10-CM

## 2015-03-06 LAB — EXPECTORATED SPUTUM ASSESSMENT W GRAM STAIN, RFLX TO RESP C

## 2015-03-06 LAB — BASIC METABOLIC PANEL
Anion gap: 10 (ref 5–15)
BUN: 12 mg/dL (ref 6–20)
CALCIUM: 8.8 mg/dL — AB (ref 8.9–10.3)
CO2: 30 mmol/L (ref 22–32)
Chloride: 98 mmol/L — ABNORMAL LOW (ref 101–111)
Creatinine, Ser: 0.69 mg/dL (ref 0.44–1.00)
GFR calc Af Amer: >60 mL/min (ref >60)
GLUCOSE: 114 mg/dL — AB (ref 65–99)
Potassium: 3.3 mmol/L — ABNORMAL LOW (ref 3.5–5.1)
Sodium: 138 mmol/L (ref 135–145)

## 2015-03-06 LAB — GLUCOSE, CAPILLARY
GLUCOSE-CAPILLARY: 123 mg/dL — AB (ref 65–99)
GLUCOSE-CAPILLARY: 124 mg/dL — AB (ref 65–99)
GLUCOSE-CAPILLARY: 106 mg/dL — AB (ref 65–99)
Glucose-Capillary: 100 mg/dL — ABNORMAL HIGH (ref 65–99)
Glucose-Capillary: 156 mg/dL — ABNORMAL HIGH (ref 65–99)

## 2015-03-06 LAB — CBC
HCT: 32.6 % — ABNORMAL LOW (ref 36.0–46.0)
Hemoglobin: 10.9 g/dL — ABNORMAL LOW (ref 12.0–15.0)
MCH: 29.3 pg (ref 26.0–34.0)
MCHC: 33.4 g/dL (ref 30.0–36.0)
MCV: 87.6 fL (ref 78.0–100.0)
PLATELETS: 192 10*3/uL (ref 150–400)
RBC: 3.72 MIL/uL — ABNORMAL LOW (ref 3.87–5.11)
RDW: 13.7 % (ref 11.5–15.5)
WBC: 11 10*3/uL — ABNORMAL HIGH (ref 4.0–10.5)

## 2015-03-06 LAB — HIV ANTIBODY (ROUTINE TESTING W REFLEX): HIV Screen 4th Generation wRfx: NONREACTIVE

## 2015-03-06 MED ORDER — LIDOCAINE VISCOUS 2 % MT SOLN
15.0000 mL | Freq: Once | OROMUCOSAL | Status: DC
  Filled 2015-03-06: qty 15

## 2015-03-06 MED ORDER — LIDOCAINE HCL 2 % EX GEL
1.0000 "application " | Freq: Once | CUTANEOUS | Status: DC
  Filled 2015-03-06: qty 5

## 2015-03-06 MED ORDER — IBUPROFEN 100 MG/5ML PO SUSP: 400.0000 mg | Freq: Once | ORAL | Status: DC

## 2015-03-06 MED ORDER — WHITE PETROLATUM GEL
Status: AC
Start: 2015-03-06 — End: 2015-03-06
  Administered 2015-03-06: 07:00:00
  Filled 2015-03-06: qty 1

## 2015-03-06 NOTE — Progress Notes (Signed)
I will plan to advance drain on Saturday and Sunday and change dressing.

## 2015-03-06 NOTE — Progress Notes (Addendum)
     Subjective: 3 Days Post-Op Procedure(s) (LRB): ANTERIOR CERVICAL DISCECTOMY AND FUSION WITH PARTIAL VERTEBRECTOMY C3 AND C4, CERVICAL PLATE AND SCREWS, ALLOGRAFT, LOCAL BONE GRAFT, VIVIGEN (N/A) DIRECT LARYNGOSCOPY AND PLACEMENT OF FEEDING TUBE REPAIR OF ESOPHAGUS Awake, alert and oriented x 4. Did sleep some last evening, NG feeding tube in when seen this AM. Drain advanced left neck this AM with minimal amount of serosangineous drainage, new dressing applied. Patient reports pain as moderate.    Objective:   VITALS:  Temp:  [98 F (36.7 C)-100.5 F (38.1 C)] 98.7 F (37.1 C) (11/11 1358) Pulse Rate:  [72-86] 74 (11/11 1358) Resp:  [15-20] 17 (11/11 1358) BP: (114-143)/(52-74) 143/74 mmHg (11/11 1358) SpO2:  [92 %-95 %] 93 % (11/11 1358) Weight:  [94.303 kg (207 lb 14.4 oz)] 94.303 kg (207 lb 14.4 oz) (11/11 0500)  Neurologically intact ABD soft Neurovascular intact Sensation intact distally Intact pulses distally Dorsiflexion/Plantar flexion intact Incision: moderate drainage Dressing left neck changed and penrose drain advanced an additional 1 inch.  LABS  Recent Labs  03/05/15 0521 03/06/15 0610  HGB 12.5 10.9*  WBC 15.1* 11.0*  PLT 214 192    Recent Labs  03/05/15 0521 03/06/15 0610  NA 135 138  K 3.8 3.3*  CL 99* 98*  CO2 28 30  BUN 11 12  CREATININE 0.76 0.69  GLUCOSE 105* 114*   No results for input(s): LABPT, INR in the last 72 hours.   Assessment/Plan: 3 Days Post-Op Procedure(s) (LRB): ANTERIOR CERVICAL DISCECTOMY AND FUSION WITH PARTIAL VERTEBRECTOMY C3 AND C4, CERVICAL PLATE AND SCREWS, ALLOGRAFT, LOCAL BONE GRAFT, VIVIGEN (N/A) DIRECT LARYNGOSCOPY AND PLACEMENT OF FEEDING TUBE REPAIR OF ESOPHAGUS  Up with therapy Continue ABX therapy due to Probable respiratory infection ? Aspiration. Left esophageal tear With recommended 5 days of antibiotics now Day #3, last day will be Sunday for antibiotic coverage for the esophageal  tear. Antibiotic coverage also for pulmonary infiltrate. I will plan to see patient this afternoon and over the weekend.  NITKA,JAMES E 03/06/2015, 4:13 PM

## 2015-03-06 NOTE — Progress Notes (Signed)
Cortrak Tube Team Note:  Consult received to place Cortrak feeding tube at bedside.  Noted pt had esophageal perforation repair by Dr Erik Obey on [id number] with feeding tube placement during surgery.  Esophageal surgery within the last 5 days is a contraindication for Cortrak placement.  Notified Dr Erik Obey by phone who plans to re-place feeding tube at bedside.  Cortrak tube with materials left at bedside for physician.   [place], [place], [place] Pager [phone] After Hours Pager

## 2015-03-06 NOTE — Progress Notes (Signed)
Patient continues with Carolyn [name] and tube feedings. CXR showed some mild base atx and sm Lt effusion. Pt is on IV antibiotics. CM will continue to follow for discharge needs.

## 2015-03-06 NOTE — Progress Notes (Signed)
Lab called to report that patient's second sputum sample would not pass because it contained too many epithelial cells in it from her mouth. They suggested getting respiratory to get a sample further down if possible. Adaly Puder, Rande Brunt, RN

## 2015-03-06 NOTE — Progress Notes (Signed)
Nutrition Follow-up  DOCUMENTATION CODES:   Obesity unspecified  INTERVENTION:  Once Cortrak feeding tube is placed, initiate Osmolite 1.2 at 20 ml/h, increase by 10 ml every 4 hours to goal rate of 60 ml/h with Prostat 30 ml BID to provide 1928 kcals, 110 gm protein, 1181 ml free water daily.   NUTRITION DIAGNOSIS:   Inadequate oral intake related to inability to eat as evidenced by NPO status.  Ongoing  GOAL:   Patient will meet greater than or equal to 90% of their needs  Unmet  MONITOR:   TF tolerance, Weight trends, Labs, I & O's  REASON FOR ASSESSMENT:   Consult Enteral/tube feeding initiation and management  ASSESSMENT:   49 yo wf pt of Dr. Louanne Skye underwent removal of cervical osteophytes several years back. Recently sustained a herniated C3-4 disk. During repair, Dr. Louanne Skye noted esophageal tear.  Pt was receiving tube feedings this morning with Osmolite 1.2 @ goal rate of 60 ml/hr. Tube feeding reached goal rate yesterday morning. Feeding tube came out this morning and tube feeds are currently being held. MD to replace Cortrak feeding tube at bedside tonight/tomorrow morning. Pt's weight remains stable.  Labs: low hemoglobin, low potassium, low chloride  Diet Order:  Diet NPO time specified  Skin:  Wound (see comment) (closed neck incision)  Last BM:  11/10  Height:   Ht Readings from Last 1 Encounters:  03/03/15 5\' 8"  (1.727 m)    Weight:   Wt Readings from Last 1 Encounters:  03/06/15 207 lb 14.4 oz (94.303 kg)    Ideal Body Weight:  63.6 kg  BMI:  Body mass index is 31.62 kg/(m^2).  Estimated Nutritional Needs:   Kcal:  [id number]  Protein:  95-115 gm  Fluid:  1.8-2 L  EDUCATION NEEDS:   No education needs identified at this time  [place], LDN Inpatient Clinical Dietitian Pager: [phone] After Hours Pager: [phone]

## 2015-03-06 NOTE — Progress Notes (Signed)
PT Cancellation Note  Patient Details Name: Carolyn [name] MRN: [id number]:[id number] DOB: July 12, 1965   Cancelled Treatment:    Reason Eval/Treat Not Completed: Medical issues which prohibited therapy (awaiting  dopplers. RN reports  that patient is up to bathroom with assist.   Claretha Cooper 03/06/2015, 1:03 PM Tresa Endo PT [phone]

## 2015-03-06 NOTE — Progress Notes (Signed)
[facility] PROGRESS NOTE  Carolyn [name] [id number] DOB: 1965-10-19 DOA: 03/03/2015 PCP: Rory Percy, MD  Assessment/Plan: 1. Healthcare associated pneumonia versus aspiration pneumonia -Carolyn [name] undergoing cervical discectomy and fusion on 03/03/2015. -Postoperatively patient having deterioration in respiratory status with elevation of white count to 15,100. She was further worked up with chest x-ray that revealed development of left lower lobe pneumonia and started on empiric IV antimicrobial therapy with vancomycin and Zosyn. -Blood cultures that were drawn on 03/05/2015 showing no growth to date. -MAXIMUM TEMPERATURE over the last 24 hours was 100.5. -We'll continue supportive care, empiric IV antibiotic therapy, follow-up on cultures.  2.  History of C3-for severe cervical stenosis -She was initially admitted to the orthopedic service on 03/03/2015 undergoing anterior cervical discectomy and fusion. -Intraoperatively she had an esophageal tear with placement of NG tube. -Dr. Lowella Petties of ENT consulted who performed direct laryngoscopy and repair of laceration. -NG tube remains in place -Continue tube feeds as directed by orthopedic surgery.  3.  Esophageal tear -As mentioned above this was a complication of recent cervical discectomy procedure. -ENT repairing laceration -An NG tube remains in place.  4.  Hypertension. -Blood pressures are stable, having systolic blood pressures in the 130s to 140s. -Continue holding antihypertensive agents for now.  Code Status: Full code Family Communication: Family not present Disposition Plan: Anticipate discharge on medically stable   Procedures:  Anterior cervical discectomy and fusion, cervical plate and screws. Procedure performed on 03/03/2015 Direct laryngoscopy with repair of hypopharyngeal laceration performed on 03/03/2015  Antibiotics:  Vancomycin  Zosyn  HPI/Subjective: Carolyn [name] is a pleasant  49 year old female who was admitted to the orthopedic service on 03/03/2015 to undergo cervical discectomy. She has a history of C3-4 severe cervical stenosis at 6.9 mm. Physical examination had revealed lower extremity hyperreflexia. She was taken to the operating room on 03/03/2015 undergoing anterior cervical discectomy and fusion, cervical plate and screws. Intraoperatively with placement of NG tube there were concerns of esophageal tear. Dr. Lowella Petties of ENT was consulted. Dr. Lowella Petties performed performed direct laryngoscopy and repaired hypopharyngeal laceration. Postoperative course was complicated by the development of pneumonia. Less revealed upward trend in her white count to 15,100 on 03/05/2015 as chest x-ray revealed a left lower lobe pneumonia. She was started on broad-spectrum IV antimicrobial therapy with vancomycin and Zosyn.  Objective: Filed Vitals:   03/06/15 1358  BP: 143/74  Pulse: 74  Temp: 98.7 F (37.1 C)  Resp: 17   No intake or output data in the 24 hours ending 03/06/15 1552 Filed Weights   03/03/15 1640 03/04/15 1900 03/06/15 0500  Weight: 94.7 kg (208 lb 12.4 oz) 95.89 kg (211 lb 6.4 oz) 94.303 kg (207 lb 14.4 oz)    Exam:   General:  Patient reports ongoing discomfort from NG tube, otherwise is awake and alert mentating well.  Cardiovascular: Tachycardic regular rate rhythm normal S1-S2  Respiratory: Normal respiratory effort, has bilateral crackles, coarse upper respiratory sounds  Abdomen: Soft nontender nondistended  Musculoskeletal: Neck brace in place  Data Reviewed: Basic Metabolic Panel:  Recent Labs Lab 03/05/15 0521 03/06/15 0610  NA 135 138  K 3.8 3.3*  CL 99* 98*  CO2 28 30  GLUCOSE 105* 114*  BUN 11 12  CREATININE 0.76 0.69  CALCIUM 8.7* 8.8*   Liver Function Tests: No results for input(s): AST, ALT, ALKPHOS, BILITOT, PROT, ALBUMIN in the last 168 hours. No results for input(s): LIPASE, AMYLASE in the last 168 hours. No results  for input(s): AMMONIA in the last 168 hours. CBC:  Recent Labs Lab 03/05/15 0521 03/06/15 0610  WBC 15.1* 11.0*  NEUTROABS 11.8*  --   HGB 12.5 10.9*  HCT 35.9* 32.6*  MCV 88.2 87.6  PLT 214 192   Cardiac Enzymes: No results for input(s): CKTOTAL, CKMB, CKMBINDEX, TROPONINI in the last 168 hours. BNP (last 3 results)  Recent Labs  03/05/15 1907  BNP 63.3    ProBNP (last 3 results) No results for input(s): PROBNP in the last 8760 hours.  CBG:  Recent Labs Lab 03/05/15 1612 03/05/15 1955 03/06/15 0002 03/06/15 0358 03/06/15 0810  GLUCAP 118* 129* 156* 123* 124*    Recent Results (from the past 240 hour(s))  Culture, blood (routine x 2) Call MD if unable to obtain prior to antibiotics being given     Status: None (Preliminary result)   Collection Time: 03/05/15  2:42 PM  Result Value Ref Range Status   Specimen Description BLOOD LEFT ARM  Final   Special Requests BOTTLES DRAWN AEROBIC ONLY 3CC  Final   Culture NO GROWTH < 24 HOURS  Final   Report Status PENDING  Incomplete  Culture, blood (routine x 2) Call MD if unable to obtain prior to antibiotics being given     Status: None (Preliminary result)   Collection Time: 03/05/15  2:50 PM  Result Value Ref Range Status   Specimen Description BLOOD LEFT HAND  Final   Special Requests BOTTLES DRAWN AEROBIC ONLY 1CC  Final   Culture NO GROWTH < 24 HOURS  Final   Report Status PENDING  Incomplete  Culture, sputum-assessment     Status: None   Collection Time: 03/05/15  5:00 PM  Result Value Ref Range Status   Specimen Description SPUTUM  Final   Special Requests NONE  Final   Sputum evaluation   Final    MICROSCOPIC FINDINGS SUGGEST THAT THIS SPECIMEN IS NOT REPRESENTATIVE OF LOWER RESPIRATORY SECRETIONS. PLEASE RECOLLECT. CALLED TO S.ASIDI,RN AT 1925 BY L.PITT 03/05/15    Report Status 03/05/2015 FINAL  Final  Culture, expectorated sputum-assessment     Status: None   Collection Time: 03/06/15 12:30 AM  Result  Value Ref Range Status   Specimen Description SPUTUM  Final   Special Requests NONE  Final   Sputum evaluation   Final    MICROSCOPIC FINDINGS SUGGEST THAT THIS SPECIMEN IS NOT REPRESENTATIVE OF LOWER RESPIRATORY SECRETIONS. PLEASE RECOLLECT. Gram Stain Report Called to,Read Back By and Verified With: [facility] @0129  03/06/15 MKELLY    Report Status 03/06/2015 FINAL  Final     Studies: Dg Chest 2 View  03/05/2015  CLINICAL DATA:  Cough and chest congestion, previous history of esophageal tear EXAM: CHEST  2 VIEW COMPARISON:  PA and lateral chest x-ray of August 20, 2012 FINDINGS: The lungs are adequately inflated. The interstitial markings are mildly increased bilaterally. There is infiltrate in the left lower lobe posterior medially. There is a trace of pleural fluid blunting the left costophrenic angles. The cardiac silhouette is top-normal in size. The pulmonary vascularity is not engorged. The mediastinum is normal in width. A feeding tube is present with the tip lying in the region of the pylorus. The bony thorax exhibits no acute abnormality. IMPRESSION: Left lower lobe pneumonia and trace left pleural effusion. Mild pulmonary interstitial prominence bilaterally may reflect low-grade interstitial edema or less likely interstitial pneumonia. Electronically Signed   By: David  Martinique M.D.   On: 03/05/2015 08:42   Dg Chest [facility]  1 View  03/06/2015  CLINICAL DATA:  Cough and congestion.  History of esophageal tear EXAM: PORTABLE CHEST 1 VIEW COMPARISON:  05/04/2014 FINDINGS: Stable cardiomegaly and upper mediastinal contours. Mild atelectatic changes at the bases, subsegmental. Small left pleural effusion is stable. Surgical drain in the left neck again noted. No soft tissue gas or pneumothorax. Feeding tube at least reaches the stomach. IMPRESSION: Stable mild basilar atelectasis and small left effusion. Electronically Signed   By: Monte Fantasia M.D.   On: 03/06/2015 06:20    Scheduled Meds: .  antiseptic oral rinse  7 mL Mouth Rinse BID  . chlorhexidine  15 mL Mouth Rinse BID  . cycloSPORINE  1 drop Both Eyes BID  . feeding supplement (PRO-STAT SUGAR FREE 64)  30 mL Per Tube BID  . pantoprazole (PROTONIX) IV  40 mg Intravenous Q12H  . piperacillin-tazobactam (ZOSYN)  IV  3.375 g Intravenous 3 times per day  . sodium chloride  3 mL Intravenous Q12H  . vancomycin  1,000 mg Intravenous Q8H   Continuous Infusions: . 0.45 % NaCl with KCl 20 mEq / L 10 mL/hr at 03/05/15 1549  . sodium chloride    . feeding supplement (OSMOLITE 1.2 CAL) 1,000 mL (03/06/15 0920)    Active Problems:   Hypertension   HNP (herniated nucleus pulposus) with myelopathy, cervical   Spinal stenosis in cervical region   S/P cervical spinal fusion   Esophageal tear   Family history of DVT   HCAP (healthcare-associated pneumonia)   Bilateral lower extremity edema   Cough    Time spent: 30 min    Kelvin Cellar  [facility] Pager [phone]. If 7PM-7AM, please contact night-coverage at [email or web address], password [facility] 03/06/2015, 3:52 PM  LOS: 3 days

## 2015-03-07 ENCOUNTER — Inpatient Hospital Stay (HOSPITAL_COMMUNITY): Payer: BC Managed Care – PPO

## 2015-03-07 DIAGNOSIS — R609 Edema, unspecified: Secondary | ICD-10-CM

## 2015-03-07 LAB — CBC
HCT: 30 % — ABNORMAL LOW (ref 36.0–46.0)
HEMOGLOBIN: 10 g/dL — AB (ref 12.0–15.0)
MCH: 29.2 pg (ref 26.0–34.0)
MCHC: 33.3 g/dL (ref 30.0–36.0)
MCV: 87.5 fL (ref 78.0–100.0)
Platelets: 190 10*3/uL (ref 150–400)
RBC: 3.43 MIL/uL — AB (ref 3.87–5.11)
RDW: 13.5 % (ref 11.5–15.5)
WBC: 8.7 10*3/uL (ref 4.0–10.5)

## 2015-03-07 LAB — GLUCOSE, CAPILLARY
Glucose-Capillary: 101 mg/dL — ABNORMAL HIGH (ref 65–99)
Glucose-Capillary: 111 mg/dL — ABNORMAL HIGH (ref 65–99)
Glucose-Capillary: 88 mg/dL (ref 65–99)
Glucose-Capillary: 91 mg/dL (ref 65–99)
Glucose-Capillary: 97 mg/dL (ref 65–99)
Glucose-Capillary: 98 mg/dL (ref 65–99)

## 2015-03-07 LAB — BASIC METABOLIC PANEL
ANION GAP: 8 (ref 5–15)
BUN: 13 mg/dL (ref 6–20)
CALCIUM: 8.6 mg/dL — AB (ref 8.9–10.3)
CO2: 29 mmol/L (ref 22–32)
Chloride: 100 mmol/L — ABNORMAL LOW (ref 101–111)
Creatinine, Ser: 0.73 mg/dL (ref 0.44–1.00)
Glucose, Bld: 109 mg/dL — ABNORMAL HIGH (ref 65–99)
POTASSIUM: 3.6 mmol/L (ref 3.5–5.1)
SODIUM: 137 mmol/L (ref 135–145)

## 2015-03-07 LAB — LEGIONELLA PNEUMOPHILA SEROGP 1 UR AG: L. pneumophila Serogp 1 Ur Ag: NEGATIVE

## 2015-03-07 MED ORDER — DIPHENHYDRAMINE HCL 50 MG/ML IJ SOLN
12.5000 mg | Freq: Once | INTRAMUSCULAR | Status: AC
  Administered 2015-03-07: 12.5 mg via INTRAVENOUS
  Filled 2015-03-07: qty 1

## 2015-03-07 MED ORDER — METOCLOPRAMIDE HCL 5 MG/ML IJ SOLN
10.0000 mg | Freq: Once | INTRAMUSCULAR | Status: AC
  Administered 2015-03-07: 10 mg via INTRAVENOUS
  Filled 2015-03-07: qty 2

## 2015-03-07 MED ORDER — TRIAMCINOLONE ACETONIDE 55 MCG/ACT NA AERO
1.0000 | INHALATION_SPRAY | Freq: Two times a day (BID) | NASAL | Status: DC
  Administered 2015-03-08 – 2015-03-13 (×9): 1 via NASAL
  Filled 2015-03-07: qty 10.8

## 2015-03-07 MED ORDER — KETOROLAC TROMETHAMINE 15 MG/ML IJ SOLN
15.0000 mg | Freq: Once | INTRAMUSCULAR | Status: AC
  Administered 2015-03-07: 15 mg via INTRAVENOUS
  Filled 2015-03-07: qty 1

## 2015-03-07 NOTE — Progress Notes (Signed)
Physical Therapy Treatment Patient Details Name: Carolyn [name] MRN: :[id number] DOB: [birth year] Today's Date: 03/07/2015    History of Present Illness pt presents with C3-4 ACDF and Esophageal tear requiring repair and NG tube placement.  pt with hx of HTN, Depression, Anxiety, and previous Cervical Surgeries.      PT Comments    Pt progressing well towards all goals. Precaution hand out provided. Acute PT to con't to follow to progress indep with mobility and stair negotiation without  Handrail.  Follow Up Recommendations  No PT follow up;Supervision - Intermittent     Equipment Recommendations  None recommended by PT    Recommendations for Other Services       Precautions / Restrictions Precautions Precautions: Cervical Precaution Comments: handout provided, went over, pt with verbal understanding Required Braces or Orthoses: Cervical Brace Cervical Brace: Soft collar;At all times Restrictions Weight Bearing Restrictions: No    Mobility  Bed Mobility Overal bed mobility: Modified Independent Bed Mobility: Rolling;Sidelying to Sit;Sit to Sidelying Rolling: Modified independent (Device/Increase time) Sidelying to sit: Modified independent (Device/Increase time)     Sit to sidelying: Supervision General bed mobility comments: HOb elevated and use of bed rails  Transfers Overall transfer level: Needs assistance Equipment used: None Transfers: Sit to/from Stand Sit to Stand: Min guard         General transfer comment: increased time  Ambulation/Gait Ambulation/Gait assistance: Min guard Ambulation Distance (Feet): 300 Feet Assistive device: None (iv pole at end of amb) Gait Pattern/deviations: Step-through pattern Gait velocity: decreased Gait velocity interpretation: Below normal speed for age/gender General Gait Details: pt moves slowly and initially used RW, but then transitioned to one UE on the IV pole.  pt fatigued after ambulation.      Stairs Stairs: Yes Stairs assistance: Min assist Stair Management: One rail Left Number of Stairs: 3 General stair comments: requires railing but doesn' thave one at home  Wheelchair Mobility    Modified Rankin (Stroke Patients Only)       Balance                                    Cognition Arousal/Alertness: Awake/alert Behavior During Therapy: WFL for tasks assessed/performed Overall Cognitive Status: Within Functional Limits for tasks assessed                      Exercises      General Comments        Pertinent Vitals/Pain Pain Assessment: 0-10 Pain Score: 2  Pain Location: throat neck Pain Descriptors / Indicators: Aching Pain Intervention(s): Monitored during session    Home Living                      Prior Function            PT Goals (current goals can now be found in the care plan section) Acute Rehab PT Goals Patient Stated Goal: go home assap Progress towards PT goals: Progressing toward goals    Frequency  Min 5X/week    PT Plan Current plan remains appropriate    Co-evaluation             End of Session Equipment Utilized During Treatment: Gait belt;Cervical collar Activity Tolerance: Patient tolerated treatment well Patient left: in bed;with call bell/phone within reach     Time: [id number] PT Time Calculation (min) (ACUTE ONLY): 23 min  Charges:  $  Gait Training: 8-22 mins $Therapeutic Activity: 8-22 mins                    G Codes:      Kingsley Callander 03/07/2015, 2:27 PM   Kittie Plater, PT, DPT Pager #: [phone] Office #: [phone]

## 2015-03-07 NOTE — Progress Notes (Addendum)
[facility] PROGRESS NOTE  LADEANA GERVAIS [id number] DOB: Oct 10, 1965 DOA: 03/03/2015 PCP: Rory Percy, MD  Assessment/Plan: 1. Healthcare associated pneumonia versus aspiration pneumonia -Carolyn [name] undergoing cervical discectomy and fusion on 03/03/2015. -Postoperatively patient having deterioration in respiratory status with elevation of white count to 15,100. She was further worked up with chest x-ray that revealed development of left lower lobe pneumonia and started on empiric IV antimicrobial therapy with vancomycin and Zosyn. -Blood cultures that were drawn on 03/05/2015 showing no growth to date. -Patient showing clinical improvement, as lab work has revealed a downward trend in her white count from 15,100 on 03/05/2015 to 8,700 on 03/07/2015.  -Plan to continue one more day of IV antimicrobial therapy, transition to by mouth if she remains afebrile.  2.  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 -Intraoperatively she had an esophageal tear with placement of NG tube. -Dr. Lowella Petties of ENT consulted who performed direct laryngoscopy and repair of laceration. -NG tube was discontinued on 03/07/2015   3.  Esophageal tear -As mentioned above this was a complication of recent cervical discectomy procedure. -ENT repairing laceration -Plan for Gastrografin swallow evaluation tomorrow morning. If if the study is normal plan to advance diet.   4.  Hypertension. -Blood pressures are stable, having systolic blood pressures in the 130s to 140s. -Continue holding antihypertensive agents for now.  Code Status: Full code Family Communication: Family not present Disposition Plan: Anticipate discharge on medically stable   Procedures:  Anterior cervical discectomy and fusion, cervical plate and screws. Procedure performed on 03/03/2015 Direct laryngoscopy with  repair of hypopharyngeal laceration performed on 03/03/2015  Antibiotics:  Vancomycin  Zosyn  HPI/Subjective: Carolyn [name] is a pleasant 49 year old female who was admitted to the orthopedic service on 03/03/2015 to undergo cervical discectomy. She has a history of C3-4 severe cervical stenosis at 6.9 mm. Physical examination had revealed lower extremity hyperreflexia. She was taken to the operating room on 03/03/2015 undergoing anterior cervical discectomy and fusion, cervical plate and screws. Intraoperatively with placement of NG tube there were concerns of esophageal tear. Dr. Lowella Petties of ENT was consulted. Dr. Lowella Petties performed performed direct laryngoscopy and repaired hypopharyngeal laceration. Postoperative course was complicated by the development of pneumonia. Less revealed upward trend in her white count to 15,100 on 03/05/2015 as chest x-ray revealed a left lower lobe pneumonia. She was started on broad-spectrum IV antimicrobial therapy with vancomycin and Zosyn.  Objective: Filed Vitals:   03/07/15 1450  BP: 135/69  Pulse: 77  Temp: 98.6 F (37 C)  Resp: 20    Intake/Output Summary (Last 24 hours) at 03/07/15 1559 Last data filed at 03/06/15 2000  Gross per 24 hour  Intake      0 ml  Output      0 ml  Net      0 ml   Filed Weights   03/04/15 1900 03/06/15 0500 03/07/15 0500  Weight: 95.89 kg (211 lb 6.4 oz) 94.303 kg (207 lb 14.4 oz) 98.521 kg (217 lb 3.2 oz)    Exam:   General:  Ambulating down the hallway, looks better today. NG tube is out.   Cardiovascular: Tachycardic regular rate rhythm normal S1-S2  Respiratory: Normal respiratory effort, has bilateral crackles, coarse upper respiratory sounds  Abdomen: Soft nontender nondistended  Musculoskeletal: Neck brace in place  Data Reviewed: Basic Metabolic Panel:  Recent Labs Lab 03/05/15 0521 03/06/15 0610 03/07/15 0515  NA 135 138 137  K 3.8 3.3* 3.6  CL 99* 98* 100*  CO2 28 30 29   GLUCOSE 105*  114* 109*  BUN 11 12 13   CREATININE 0.76 0.69 0.73  CALCIUM 8.7* 8.8* 8.6*   Liver Function Tests: No results for input(s): AST, ALT, ALKPHOS, BILITOT, PROT, ALBUMIN in the last 168 hours. No results for input(s): LIPASE, AMYLASE in the last 168 hours. No results for input(s): AMMONIA in the last 168 hours. CBC:  Recent Labs Lab 03/05/15 0521 03/06/15 0610 03/07/15 0515  WBC 15.1* 11.0* 8.7  NEUTROABS 11.8*  --   --   HGB 12.5 10.9* 10.0*  HCT 35.9* 32.6* 30.0*  MCV 88.2 87.6 87.5  PLT 214 192 190   Cardiac Enzymes: No results for input(s): CKTOTAL, CKMB, CKMBINDEX, TROPONINI in the last 168 hours. BNP (last 3 results)  Recent Labs  03/05/15 1907  BNP 63.3    ProBNP (last 3 results) No results for input(s): PROBNP in the last 8760 hours.  CBG:  Recent Labs Lab 03/06/15 1956 03/07/15 0006 03/07/15 0350 03/07/15 0737 03/07/15 1116  GLUCAP 106* 111* 97 91 88    Recent Results (from the past 240 hour(s))  Culture, blood (routine x 2) Call MD if unable to obtain prior to antibiotics being given     Status: None (Preliminary result)   Collection Time: 03/05/15  2:42 PM  Result Value Ref Range Status   Specimen Description BLOOD LEFT ARM  Final   Special Requests BOTTLES DRAWN AEROBIC ONLY 3CC  Final   Culture NO GROWTH 2 DAYS  Final   Report Status PENDING  Incomplete  Culture, blood (routine x 2) Call MD if unable to obtain prior to antibiotics being given     Status: None (Preliminary result)   Collection Time: 03/05/15  2:50 PM  Result Value Ref Range Status   Specimen Description BLOOD LEFT HAND  Final   Special Requests BOTTLES DRAWN AEROBIC ONLY 1CC  Final   Culture NO GROWTH 2 DAYS  Final   Report Status PENDING  Incomplete  Culture, sputum-assessment     Status: None   Collection Time: 03/05/15  5:00 PM  Result Value Ref Range Status   Specimen Description SPUTUM  Final   Special Requests NONE  Final   Sputum evaluation   Final    MICROSCOPIC  FINDINGS SUGGEST THAT THIS SPECIMEN IS NOT REPRESENTATIVE OF LOWER RESPIRATORY SECRETIONS. PLEASE RECOLLECT. CALLED TO S.ASIDI,RN AT 1925 BY L.PITT 03/05/15    Report Status 03/05/2015 FINAL  Final  Culture, expectorated sputum-assessment     Status: None   Collection Time: 03/06/15 12:30 AM  Result Value Ref Range Status   Specimen Description SPUTUM  Final   Special Requests NONE  Final   Sputum evaluation   Final    MICROSCOPIC FINDINGS SUGGEST THAT THIS SPECIMEN IS NOT REPRESENTATIVE OF LOWER RESPIRATORY SECRETIONS. PLEASE RECOLLECT. Gram Stain Report Called to,Read Back By and Verified With: [facility] @0129  03/06/15 MKELLY    Report Status 03/06/2015 FINAL  Final     Studies: Dg Chest Port 1 View  03/06/2015  CLINICAL DATA:  Cough and congestion.  History of esophageal tear EXAM: PORTABLE CHEST 1 VIEW COMPARISON:  05/04/2014 FINDINGS: Stable cardiomegaly and upper mediastinal contours. Mild atelectatic changes at the bases, subsegmental. Small left pleural effusion is stable. Surgical drain in the left neck again noted. No soft tissue gas or pneumothorax. Feeding tube at least reaches the stomach. IMPRESSION: Stable mild basilar atelectasis and  small left effusion. Electronically Signed   By: Monte Fantasia M.D.   On: 03/06/2015 06:20    Scheduled Meds: . antiseptic oral rinse  7 mL Mouth Rinse BID  . chlorhexidine  15 mL Mouth Rinse BID  . cycloSPORINE  1 drop Both Eyes BID  . feeding supplement (PRO-STAT SUGAR FREE 64)  30 mL Per Tube BID  . lidocaine  1 application Tracheal Tube Once  . lidocaine  15 mL Mouth/Throat Once  . pantoprazole (PROTONIX) IV  40 mg Intravenous Q12H  . piperacillin-tazobactam (ZOSYN)  IV  3.375 g Intravenous 3 times per day  . sodium chloride  3 mL Intravenous Q12H  . vancomycin  1,000 mg Intravenous Q8H   Continuous Infusions: . 0.45 % NaCl with KCl 20 mEq / L 100 mL/hr at 03/07/15 0857  . sodium chloride    . feeding supplement (OSMOLITE 1.2 CAL)  1,000 mL (03/06/15 0920)    Active Problems:   Hypertension   HNP (herniated nucleus pulposus) with myelopathy, cervical   Spinal stenosis in cervical region   S/P cervical spinal fusion   Esophageal tear   Family history of DVT   HCAP (healthcare-associated pneumonia)   Bilateral lower extremity edema   Cough    Time spent: 25 min    Kelvin Cellar  [facility] Pager [phone]. If 7PM-7AM, please contact night-coverage at [email or web address], password [facility] 03/07/2015, 3:59 PM  LOS: 4 days

## 2015-03-07 NOTE — Progress Notes (Signed)
03/07/2015 1:08 PM  Carolyn [name] [id number]  Post-Op Day 4    Temp:  [98 F (36.7 C)-98.9 F (37.2 C)] 98.9 F (37.2 C) (11/12 0954) Pulse Rate:  [70-95] 86 (11/12 0954) Resp:  [16-20] 20 (11/12 0954) BP: (115-151)/(69-87) 151/87 mmHg (11/12 0954) SpO2:  [93 %-99 %] 99 % (11/12 0954) Weight:  [98.521 kg (217 lb 3.2 oz)] 98.521 kg (217 lb 3.2 oz) (11/12 0500),     Intake/Output Summary (Last 24 hours) at 03/07/15 1308 Last data filed at 03/06/15 2000  Gross per 24 hour  Intake      0 ml  Output      0 ml  Net      0 ml    Results for orders placed or performed during the hospital encounter of 03/03/15 (from the past 24 hour(s))  Glucose, capillary     Status: Abnormal   Collection Time: 03/06/15  5:13 PM  Result Value Ref Range   Glucose-Capillary 100 (H) 65 - 99 mg/dL  Glucose, capillary     Status: Abnormal   Collection Time: 03/06/15  7:56 PM  Result Value Ref Range   Glucose-Capillary 106 (H) 65 - 99 mg/dL   Comment 1 Notify RN    Comment 2 Document in Chart   Glucose, capillary     Status: Abnormal   Collection Time: 03/07/15 12:06 AM  Result Value Ref Range   Glucose-Capillary 111 (H) 65 - 99 mg/dL   Comment 1 Notify RN    Comment 2 Document in Chart   Glucose, capillary     Status: None   Collection Time: 03/07/15  3:50 AM  Result Value Ref Range   Glucose-Capillary 97 65 - 99 mg/dL   Comment 1 Notify RN    Comment 2 Document in Chart   Basic metabolic panel     Status: Abnormal   Collection Time: 03/07/15  5:15 AM  Result Value Ref Range   Sodium 137 135 - 145 mmol/L   Potassium 3.6 3.5 - 5.1 mmol/L   Chloride 100 (L) 101 - 111 mmol/L   CO2 29 22 - 32 mmol/L   Glucose, Bld 109 (H) 65 - 99 mg/dL   BUN 13 6 - 20 mg/dL   Creatinine, Ser 0.73 0.44 - 1.00 mg/dL   Calcium 8.6 (L) 8.9 - 10.3 mg/dL   GFR calc non Af Amer >60 >60 mL/min   GFR calc Af Amer >60 >60 mL/min   Anion gap 8 5 - 15  CBC     Status: Abnormal   Collection Time: 03/07/15  5:15  AM  Result Value Ref Range   WBC 8.7 4.0 - 10.5 K/uL   RBC 3.43 (L) 3.87 - 5.11 MIL/uL   Hemoglobin 10.0 (L) 12.0 - 15.0 g/dL   HCT 30.0 (L) 36.0 - 46.0 %   MCV 87.5 78.0 - 100.0 fL   MCH 29.2 26.0 - 34.0 pg   MCHC 33.3 30.0 - 36.0 g/dL   RDW 13.5 11.5 - 15.5 %   Platelets 190 150 - 400 K/uL  Glucose, capillary     Status: None   Collection Time: 03/07/15  7:37 AM  Result Value Ref Range   Glucose-Capillary 91 65 - 99 mg/dL  Glucose, capillary     Status: None   Collection Time: 03/07/15 11:16 AM  Result Value Ref Range   Glucose-Capillary 88 65 - 99 mg/dL    SUBJECTIVE:  Neck pain moderate.  No difficulty with saliva.  Voice  some raspy. No breathing difficulty.  External drains removed this AM.  NG tube accidentally pulled out yesterday.  OBJECTIVE:  Neck flat without erythema or fluctuance.  Voice raspy but no stridor.  IMPRESSION:  Doing well.    PLAN:  OK for Gastrografin swallow, to be followed with Barium if needed, tomorrow AM. If OK, begin clear liquids.  Will hold off on replacing NG tube for now.  Discussed with patient.  [name]

## 2015-03-07 NOTE — Progress Notes (Addendum)
     Subjective: 4 Days Post-Op Procedure(s) (LRB): ANTERIOR CERVICAL DISCECTOMY AND FUSION WITH PARTIAL VERTEBRECTOMY C3 AND C4, CERVICAL PLATE AND SCREWS, ALLOGRAFT, LOCAL BONE GRAFT, VIVIGEN (N/A) DIRECT LARYNGOSCOPY AND PLACEMENT OF FEEDING TUBE REPAIR OF ESOPHAGUS Awake alert and oriented x 4. Productive cough. No chills or fever. Patient reports pain as moderate.   Internal medicine assistance appreciated.  Objective:   VITALS:  Temp:  [98 F (36.7 C)-98.9 F (37.2 C)] 98.6 F (37 C) (11/12 0551) Pulse Rate:  [70-95] 70 (11/12 0551) Resp:  [15-20] 20 (11/12 0551) BP: (115-143)/(69-78) 118/74 mmHg (11/12 0551) SpO2:  [92 %-99 %] 96 % (11/12 0551) Weight:  [98.521 kg (217 lb 3.2 oz)] 98.521 kg (217 lb 3.2 oz) (11/12 0500)  Neurologically intact ABD soft Neurovascular intact Sensation intact distally Intact pulses distally Dorsiflexion/Plantar flexion intact Incision: scant drainage Non invasive U/S present to do LE venous studies. Drain left neck advanced and penrose drain removed.  LABS  Recent Labs  03/05/15 0521 03/06/15 0610 03/07/15 0515  HGB 12.5 10.9* 10.0*  WBC 15.1* 11.0* 8.7  PLT 214 192 190    Recent Labs  03/06/15 0610 03/07/15 0515  NA 138 137  K 3.3* 3.6  CL 98* 100*  CO2 30 29  BUN 12 13  CREATININE 0.69 0.73  GLUCOSE 114* 109*   No results for input(s): LABPT, INR in the last 72 hours.   Assessment/Plan: 4 Days Post-Op Procedure(s) (LRB): ANTERIOR CERVICAL DISCECTOMY AND FUSION WITH PARTIAL VERTEBRECTOMY C3 AND C4, CERVICAL PLATE AND SCREWS, ALLOGRAFT, LOCAL BONE GRAFT, VIVIGEN (N/A) DIRECT LARYNGOSCOPY AND PLACEMENT OF FEEDING TUBE REPAIR OF ESOPHAGUS Esophageal tear Antibiotic Day #4. WBC 8.7K, trending to normal LLL infiltrate c/w pneumonia Antibiotic Day #2 WBC 8.7K trending to normal. NPO Day #4. Cr. 0.73 BUN 13 normal.  Anemia, mild.  Up with therapy Increase IV fluids to 100 cc/hr due to NG tube out and no feeding or  hydration, current IV rate 10 cc/hr. Day #4 of antibiotics for esophageal tear. Day #2 for pneumonia Vancomycin/ceftriaxone Continue ABX therapy due to esophageal tear to prophylax vs infection and for xray concerning for pneumonia LLL. May hold on replacement of NGT as she is presently on NPO day #4. Depends on the expected Further time she would remain NPO, less pharyngeal and throat irritation without the tube.  NITKA,JAMES E 03/07/2015, 8:58 AM

## 2015-03-07 NOTE — Progress Notes (Signed)
VASCULAR LAB PRELIMINARY  PRELIMINARY  PRELIMINARY  PRELIMINARY  Bilateral lower extremity venous duplex  completed.    Preliminary report:  Bilateral:  No evidence of DVT, superficial thrombosis, or Baker's Cyst.    Kaysen Deal, RVT 03/07/2015, 9:15 AM

## 2015-03-08 ENCOUNTER — Inpatient Hospital Stay (HOSPITAL_COMMUNITY): Payer: BC Managed Care – PPO

## 2015-03-08 LAB — GLUCOSE, CAPILLARY
GLUCOSE-CAPILLARY: 84 mg/dL (ref 65–99)
Glucose-Capillary: 77 mg/dL (ref 65–99)
Glucose-Capillary: 84 mg/dL (ref 65–99)
Glucose-Capillary: 84 mg/dL (ref 65–99)
Glucose-Capillary: 90 mg/dL (ref 65–99)

## 2015-03-08 NOTE — Progress Notes (Signed)
[facility] PROGRESS NOTE  Carolyn [name] [id number] DOB: 1965/08/06 DOA: 03/03/2015 PCP: Rory Percy, MD  Assessment/Plan: 1. Healthcare associated pneumonia versus aspiration pneumonia -Carolyn [name] undergoing cervical discectomy and fusion on 03/03/2015. -Postoperatively patient having deterioration in respiratory status with elevation of white count to 15,100. She was further worked up with chest x-ray that revealed development of left lower lobe pneumonia and started on empiric IV antimicrobial therapy with vancomycin and Zosyn. -Blood cultures that were drawn on 03/05/2015 showing no growth to date. -Patient showing clinical improvement, as lab work has revealed a downward trend in her white count from 15,100 on 03/05/2015 to 8,700 on 03/07/2015.  -On 03/08/2015 she remains hemodynamically stable, afebrile. Remains on IV antibiotic therapy. Will await results of Gastrografin study before transitioning to oral antibiotic therapy.  2.  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 -Intraoperatively she had an esophageal tear with placement of NG tube. -Dr. Lowella Petties of ENT consulted who performed direct laryngoscopy and repair of laceration. -NG tube was discontinued on 03/07/2015   3.  Esophageal tear -As mentioned above this was a complication of recent cervical discectomy procedure. -ENT repairing laceration -Plan for Gastrografin swallow evaluation on 03/08/2015. If if the study is normal plan to advance diet.   4.  Hypertension. -Blood pressures are stable, having systolic blood pressures in the 130s to 140s. -Continue holding antihypertensive agents for now.  Code Status: Full code Family Communication: Family not present Disposition Plan: Anticipate discharge on medically stable   Procedures:  Anterior cervical discectomy and fusion, cervical plate  and screws. Procedure performed on 03/03/2015 Direct laryngoscopy with repair of hypopharyngeal laceration performed on 03/03/2015  Antibiotics:  Vancomycin  Zosyn  HPI/Subjective: Carolyn [name] is a pleasant 49 year old female who was admitted to the orthopedic service on 03/03/2015 to undergo cervical discectomy. She has a history of C3-4 severe cervical stenosis at 6.9 mm. Physical examination had revealed lower extremity hyperreflexia. She was taken to the operating room on 03/03/2015 undergoing anterior cervical discectomy and fusion, cervical plate and screws. Intraoperatively with placement of NG tube there were concerns of esophageal tear. Dr. Lowella Petties of ENT was consulted. Dr. Lowella Petties performed performed direct laryngoscopy and repaired hypopharyngeal laceration. Postoperative course was complicated by the development of pneumonia. Less revealed upward trend in her white count to 15,100 on 03/05/2015 as chest x-ray revealed a left lower lobe pneumonia. She was started on broad-spectrum IV antimicrobial therapy with vancomycin and Zosyn.  Objective: Filed Vitals:   03/08/15 1055  BP: 141/79  Pulse: 60  Temp: 97.9 F (36.6 C)  Resp: 20   No intake or output data in the 24 hours ending 03/08/15 1129 Filed Weights   03/06/15 0500 03/07/15 0500 03/08/15 0500  Weight: 94.303 kg (207 lb 14.4 oz) 98.521 kg (217 lb 3.2 oz) 97.977 kg (216 lb)    Exam:   General:  Patient appears generally well, no acute distress. Breathing comfortably on room air.  Cardiovascular: Tachycardic regular rate rhythm normal S1-S2  Respiratory: Normal respiratory effort, has bilateral crackles, coarse upper respiratory sounds  Abdomen: Soft nontender nondistended  Musculoskeletal: Neck brace in place  Data Reviewed: Basic Metabolic Panel:  Recent Labs Lab 03/05/15 0521 03/06/15 0610 03/07/15 0515  NA 135 138 137  K 3.8 3.3* 3.6  CL 99* 98* 100*  CO2 28 30 29   GLUCOSE 105* 114* 109*  BUN 11  12 13   CREATININE  0.76 0.69 0.73  CALCIUM 8.7* 8.8* 8.6*   Liver Function Tests: No results for input(s): AST, ALT, ALKPHOS, BILITOT, PROT, ALBUMIN in the last 168 hours. No results for input(s): LIPASE, AMYLASE in the last 168 hours. No results for input(s): AMMONIA in the last 168 hours. CBC:  Recent Labs Lab 03/05/15 0521 03/06/15 0610 03/07/15 0515  WBC 15.1* 11.0* 8.7  NEUTROABS 11.8*  --   --   HGB 12.5 10.9* 10.0*  HCT 35.9* 32.6* 30.0*  MCV 88.2 87.6 87.5  PLT 214 192 190   Cardiac Enzymes: No results for input(s): CKTOTAL, CKMB, CKMBINDEX, TROPONINI in the last 168 hours. BNP (last 3 results)  Recent Labs  03/05/15 1907  BNP 63.3    ProBNP (last 3 results) No results for input(s): PROBNP in the last 8760 hours.  CBG:  Recent Labs Lab 03/07/15 1116 03/07/15 1643 03/07/15 2004 03/08/15 0009 03/08/15 0357  GLUCAP 88 98 101* 90 84    Recent Results (from the past 240 hour(s))  Culture, blood (routine x 2) Call MD if unable to obtain prior to antibiotics being given     Status: None (Preliminary result)   Collection Time: 03/05/15  2:42 PM  Result Value Ref Range Status   Specimen Description BLOOD LEFT ARM  Final   Special Requests BOTTLES DRAWN AEROBIC ONLY 3CC  Final   Culture NO GROWTH 2 DAYS  Final   Report Status PENDING  Incomplete  Culture, blood (routine x 2) Call MD if unable to obtain prior to antibiotics being given     Status: None (Preliminary result)   Collection Time: 03/05/15  2:50 PM  Result Value Ref Range Status   Specimen Description BLOOD LEFT HAND  Final   Special Requests BOTTLES DRAWN AEROBIC ONLY 1CC  Final   Culture NO GROWTH 2 DAYS  Final   Report Status PENDING  Incomplete  Culture, sputum-assessment     Status: None   Collection Time: 03/05/15  5:00 PM  Result Value Ref Range Status   Specimen Description SPUTUM  Final   Special Requests NONE  Final   Sputum evaluation   Final    MICROSCOPIC FINDINGS SUGGEST THAT  THIS SPECIMEN IS NOT REPRESENTATIVE OF LOWER RESPIRATORY SECRETIONS. PLEASE RECOLLECT. CALLED TO S.ASIDI,RN AT 1925 BY L.PITT 03/05/15    Report Status 03/05/2015 FINAL  Final  Culture, expectorated sputum-assessment     Status: None   Collection Time: 03/06/15 12:30 AM  Result Value Ref Range Status   Specimen Description SPUTUM  Final   Special Requests NONE  Final   Sputum evaluation   Final    MICROSCOPIC FINDINGS SUGGEST THAT THIS SPECIMEN IS NOT REPRESENTATIVE OF LOWER RESPIRATORY SECRETIONS. PLEASE RECOLLECT. Gram Stain Report Called to,Read Back By and Verified With: [facility] @0129  03/06/15 MKELLY    Report Status 03/06/2015 FINAL  Final     Studies: No results found.  Scheduled Meds: . antiseptic oral rinse  7 mL Mouth Rinse BID  . chlorhexidine  15 mL Mouth Rinse BID  . cycloSPORINE  1 drop Both Eyes BID  . feeding supplement (PRO-STAT SUGAR FREE 64)  30 mL Per Tube BID  . lidocaine  1 application Tracheal Tube Once  . lidocaine  15 mL Mouth/Throat Once  . pantoprazole (PROTONIX) IV  40 mg Intravenous Q12H  . piperacillin-tazobactam (ZOSYN)  IV  3.375 g Intravenous 3 times per day  . sodium chloride  3 mL Intravenous Q12H  . triamcinolone  1 spray Each Nare  BID  . vancomycin  1,000 mg Intravenous Q8H   Continuous Infusions: . 0.45 % NaCl with KCl 20 mEq / L 100 mL/hr at 03/08/15 1123  . sodium chloride    . feeding supplement (OSMOLITE 1.2 CAL) 1,000 mL (03/06/15 0920)    Active Problems:   Hypertension   HNP (herniated nucleus pulposus) with myelopathy, cervical   Spinal stenosis in cervical region   S/P cervical spinal fusion   Esophageal tear   Family history of DVT   HCAP (healthcare-associated pneumonia)   Bilateral lower extremity edema   Cough    Time spent: 15 min    Kelvin Cellar  [facility] Pager [phone]. If 7PM-7AM, please contact night-coverage at [email or web address], password [facility] 03/08/2015, 11:29 AM  LOS: 5 days

## 2015-03-08 NOTE — Progress Notes (Signed)
03/08/2015 4:30 PM  [name] OZ:[id number]  Post-Op Day 5    Temp:  [97.9 F (36.6 C)-99.5 F (37.5 C)] 98.4 F (36.9 C) (11/13 1332) Pulse Rate:  [60-101] 72 (11/13 1332) Resp:  [20] 20 (11/13 1332) BP: (131-149)/(71-82) 149/78 mmHg (11/13 1332) SpO2:  [96 %-98 %] 98 % (11/13 1332) Weight:  [97.977 kg (216 lb)] 97.977 kg (216 lb) (11/13 0500),    No intake or output data in the 24 hours ending 03/08/15 1630  Results for orders placed or performed during the hospital encounter of 03/03/15 (from the past 24 hour(s))  Glucose, capillary     Status: None   Collection Time: 03/07/15  4:43 PM  Result Value Ref Range   Glucose-Capillary 98 65 - 99 mg/dL  Glucose, capillary     Status: Abnormal   Collection Time: 03/07/15  8:04 PM  Result Value Ref Range   Glucose-Capillary 101 (H) 65 - 99 mg/dL   Comment 1 Notify RN    Comment 2 Document in Chart   Glucose, capillary     Status: None   Collection Time: 03/08/15 12:09 AM  Result Value Ref Range   Glucose-Capillary 90 65 - 99 mg/dL   Comment 1 Notify RN    Comment 2 Document in Chart   Glucose, capillary     Status: None   Collection Time: 03/08/15  3:57 AM  Result Value Ref Range   Glucose-Capillary 84 65 - 99 mg/dL   Comment 1 Notify RN    Comment 2 Document in Chart   Glucose, capillary     Status: None   Collection Time: 03/08/15 11:44 AM  Result Value Ref Range   Glucose-Capillary 84 65 - 99 mg/dL   Comment 1 Notify RN    Comment 2 Document in Chart   Glucose, capillary     Status: None   Collection Time: 03/08/15  4:10 PM  Result Value Ref Range   Glucose-Capillary 77 65 - 99 mg/dL   Comment 1 Notify RN    Comment 2 Document in Chart      OBJECTIVE:  Gastrografin swallow shows distal pharyngeal/proximal esophageal irregularity.  No obvious extravasation of contrast.  Early aspiration.    IMPRESSION:  Dysphagia, no leak  PLAN:  Will ask Speech Path to assist with swallow eval.  Will hold off on  replacing NG pending their conclusions.  Carolyn [name]

## 2015-03-09 DIAGNOSIS — M4802 Spinal stenosis, cervical region: Secondary | ICD-10-CM

## 2015-03-09 LAB — BASIC METABOLIC PANEL
ANION GAP: 12 (ref 5–15)
BUN: 14 mg/dL (ref 6–20)
CO2: 27 mmol/L (ref 22–32)
Calcium: 9.3 mg/dL (ref 8.9–10.3)
Chloride: 99 mmol/L — ABNORMAL LOW (ref 101–111)
Creatinine, Ser: 1.43 mg/dL — ABNORMAL HIGH (ref 0.44–1.00)
GFR, EST AFRICAN AMERICAN: 49 mL/min — AB (ref >60)
GFR, EST NON AFRICAN AMERICAN: 42 mL/min — AB (ref >60)
GLUCOSE: 106 mg/dL — AB (ref 65–99)
POTASSIUM: 3.9 mmol/L (ref 3.5–5.1)
Sodium: 138 mmol/L (ref 135–145)

## 2015-03-09 LAB — GLUCOSE, CAPILLARY
GLUCOSE-CAPILLARY: 106 mg/dL — AB (ref 65–99)
GLUCOSE-CAPILLARY: 84 mg/dL (ref 65–99)
GLUCOSE-CAPILLARY: 102 mg/dL — AB (ref 65–99)
Glucose-Capillary: 101 mg/dL — ABNORMAL HIGH (ref 65–99)
Glucose-Capillary: 83 mg/dL (ref 65–99)
Glucose-Capillary: 92 mg/dL (ref 65–99)

## 2015-03-09 LAB — CBC WITH DIFFERENTIAL/PLATELET
BASOS ABS: 0 10*3/uL (ref 0.0–0.1)
BASOS PCT: 0 %
Eosinophils Absolute: 0.2 10*3/uL (ref 0.0–0.7)
Eosinophils Relative: 2 %
HEMATOCRIT: 30.9 % — AB (ref 36.0–46.0)
Hemoglobin: 10.9 g/dL — ABNORMAL LOW (ref 12.0–15.0)
LYMPHS PCT: 9 %
Lymphs Abs: 1.1 10*3/uL (ref 0.7–4.0)
MCH: 30.2 pg (ref 26.0–34.0)
MCHC: 35.3 g/dL (ref 30.0–36.0)
MCV: 85.6 fL (ref 78.0–100.0)
Monocytes Absolute: 1.1 10*3/uL — ABNORMAL HIGH (ref 0.1–1.0)
Monocytes Relative: 10 %
NEUTROS ABS: 9.4 10*3/uL — AB (ref 1.7–7.7)
NEUTROS PCT: 79 %
Platelets: 211 10*3/uL (ref 150–400)
RBC: 3.61 MIL/uL — AB (ref 3.87–5.11)
RDW: 13.2 % (ref 11.5–15.5)
WBC: 11.9 10*3/uL — AB (ref 4.0–10.5)

## 2015-03-09 LAB — VANCOMYCIN, TROUGH: VANCOMYCIN TR: 34 ug/mL — AB (ref 10.0–20.0)

## 2015-03-09 LAB — LACTIC ACID, PLASMA: Lactic Acid, Venous: 1.1 mmol/L (ref 0.5–2.0)

## 2015-03-09 LAB — SEDIMENTATION RATE: SED RATE: 82 mm/h — AB (ref 0–22)

## 2015-03-09 LAB — C-REACTIVE PROTEIN: CRP: 4.6 mg/dL — ABNORMAL HIGH (ref <1.0)

## 2015-03-09 MED ORDER — LABETALOL HCL 5 MG/ML IV SOLN: 10.0000 mg | INTRAVENOUS | Status: DC | PRN

## 2015-03-09 MED ORDER — SODIUM CHLORIDE 0.9 % IV BOLUS (SEPSIS)
1000.0000 mL | Freq: Once | INTRAVENOUS | Status: AC
  Administered 2015-03-09: 1000 mL via INTRAVENOUS

## 2015-03-09 MED ORDER — SODIUM CHLORIDE 0.9 % IV SOLN
INTRAVENOUS | Status: DC
  Administered 2015-03-09: 14:00:00 via INTRAVENOUS

## 2015-03-09 MED ORDER — SODIUM CHLORIDE 0.9 % IV BOLUS (SEPSIS): 1000.0000 mL | Freq: Once | INTRAVENOUS | Status: DC

## 2015-03-09 NOTE — Progress Notes (Signed)
Nutrition Follow-up  DOCUMENTATION CODES:   Obesity unspecified  INTERVENTION:  Recommend placing NGT to start enteral nutrition until patient is able to eat PO (Initiate Osmolite 1.2 at 20 ml/h, increase by 10 ml every 4 hours to goal rate of 60 ml/h with Prostat 30 ml BID to provide 1928 kcals, 110 gm protein, 1181 ml free water daily)   NUTRITION DIAGNOSIS:   Inadequate oral intake related to inability to eat as evidenced by NPO status.  Ongoing  GOAL:   Patient will meet greater than or equal to 90% of their needs  Unmet  MONITOR:   Diet advancement, TF tolerance, Weight trends, Labs, I & O's  REASON FOR ASSESSMENT:   Consult Enteral/tube feeding initiation and management  ASSESSMENT:   49 yo wf pt of Dr. Louanne Skye underwent removal of cervical osteophytes several years back. Recently sustained a herniated C3-4 disk. During repair, Dr. Louanne Skye noted esophageal tear.  Pt was assessed by SLP this morning and determined to be at moderate aspiration risk. Pt has been allowed to have one ice chip every minute for up to 30 minutes. Pt reports that she has had coughing with ice chips. She states that she does not feel hungry and is not particularly interested in receiving an NGT for nutrition. Per ENT note, "hold off on replacing NG pending their [SLP] conclusions". RD to follow-up tomorrow to review decision.   Labs reviewed.   Diet Order:  Diet NPO time specified  Skin:  Wound (see comment) (closed neck incision)  Last BM:  11/10  Height:   Ht Readings from Last 1 Encounters:  03/03/15 5\' 8"  (1.727 m)    Weight:   Wt Readings from Last 1 Encounters:  03/09/15 218 lb 5 oz (99.026 kg)    Ideal Body Weight:  63.6 kg  BMI:  Body mass index is 33.2 kg/(m^2).  Estimated Nutritional Needs:   Kcal:  [id number]  Protein:  95-115 gm  Fluid:  1.8-2 L  EDUCATION NEEDS:   No education needs identified at this time  [place], LDN Inpatient Clinical  Dietitian Pager: [phone] After Hours Pager: [phone]

## 2015-03-09 NOTE — Progress Notes (Signed)
ANTIBIOTIC CONSULT NOTE - FOLLOW UP  Pharmacy Consult for Vancomycin and Zosyn Indication: pneumonia, HCAP vs aspiration  No Known Allergies  Patient Measurements: Height: _0  (172.7 cm) Weight: 218 lb 5 oz (99.026 kg) IBW/kg (Calculated) : 63.9  Vital Signs: Temp: 97.9 F (36.6 C) (11/14 1357) Temp Source: Oral (11/14 1357) BP: 160/88 mmHg (11/14 1357) Pulse Rate: 106 (11/14 1357) Intake/Output from previous day:   Intake/Output from this shift: Total I/O In: 50 [IV Piggyback:50] Out: -   Labs:  Recent Labs  03/07/15 0515 03/09/15 1119  WBC 8.7 11.9*  HGB 10.0* 10.9*  PLT 190 211  CREATININE 0.73 1.43*   Estimated Creatinine Clearance: 58.5 mL/min (by C-G formula based on Cr of 1.43).  Assessment:   Day # 5 Vancomycin and Zosyn after 3 days of Ancef.   Creatinine 0.73>1.43. Now on IVF. Currently on Vanc 1 gm IV q8hrs.   Vancomycin clearance is likely slower than originally predicted.   Afebrile, WBC up to 11.9. Cultures negative to date.  ESR 82 and CRP 4.6, both up.  Lactic acid pending.  Goal of Therapy:  Vancomycin trough level 15-20 mcg/ml  Plan:    Vanc trough level at 9:30pm, prior to dose scheduled for 10pm.   Hold 10 pm Vanc dose.   Adjust Vanc regimen according to level.   Continue Zosyn 3.375 gm IV q8hrs (each over 4 hrs).   Follow renal function, culture data, progress.  Arty Baumgartner,  Pager: [phone] 03/09/2015,2:24 PM

## 2015-03-09 NOTE — Progress Notes (Signed)
ANTIBIOTIC CONSULT NOTE  Pharmacy Consult for Vancomycin  Indication: pneumonia  No Known Allergies  Patient Measurements: Height: 5\' 8"  (172.7 cm) Weight: 218 lb 5 oz (99.026 kg) IBW/kg (Calculated) : 63.9  Vital Signs: Temp: 99 F (37.2 C) (11/14 2113) Temp Source: Oral (11/14 2113) BP: 146/74 mmHg (11/14 2113) Pulse Rate: 75 (11/14 2113) Intake/Output from previous day:   Intake/Output from this shift:    Labs:  Recent Labs  03/07/15 0515 03/09/15 1119  WBC 8.7 11.9*  HGB 10.0* 10.9*  PLT 190 211  CREATININE 0.73 1.43*   Estimated Creatinine Clearance: 58.5 mL/min (by C-G formula based on Cr of 1.43).  Recent Labs  03/09/15 2158  VANCOTROUGH 89*    Assessment: 49 y.o. female with PNA s/p spinal surgery for empiric antibiotics.    Goal of Therapy:  Vancomycin trough level 15-20 mcg/ml  Plan:  Continue to hold Vancomycin F/U BMet in am If SCr stabilizes or improves could resume Vancomycin tomorrow morning at lower dose.  Caryl Pina 03/09/2015,11:21 PM

## 2015-03-09 NOTE — Progress Notes (Addendum)
[facility] PROGRESS NOTE  Carolyn [name] [id number] DOB: [birth year] DOA: 03/03/2015 PCP: Rory Percy, MD  Assessment/Plan: 1. Healthcare associated pneumonia versus aspiration pneumonia -Carolyn [name] undergoing cervical discectomy and fusion on 03/03/2015. -Postoperatively patient having deterioration in respiratory status with elevation of white count to 15,100. She was further worked up with chest x-ray that revealed development of left lower lobe pneumonia and started on empiric IV antimicrobial therapy with vancomycin and Zosyn. -Blood cultures that were drawn on 03/05/2015 showing no growth to date. -Patient showing clinical improvement, as lab work has revealed a downward trend in her white count from 15,100 on 03/05/2015 to 8,700 on 03/07/2015.  -On 03/09/2015 white count trending up to 11.9. She seems stable from a respiratory standpoint.  -Will continue IV AB's for now   2.  Acute kidney injury -Repeat lab work on 03/09/2015 showing an upward trend in her creatinine to 1.43 from 0.73 on 03/07/2015. Likely reflecting prerenal as azothemia.  -Will provide a 1L bolus of normal saline, followed by maintenance fluids at 125 mL/hour, repeat labs in a.m.  3.  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 -Intraoperatively she had an esophageal tear with placement of NG tube. -Dr. Lowella Petties of ENT consulted who performed direct laryngoscopy and repair of laceration. -On 03/09/2015 I spoke with Dr Louanne Skye. We discussed increasing white count and possibility of abscess formation complicating esophageal laceration. He is ordering an U/S -Continue broad spectrum antibiotics for now.   3.  Esophageal tear -As mentioned above this was a complication of recent cervical discectomy procedure. -ENT repairing laceration -Gastrografin swallow evaluation on 03/08/2015 did not  show evidence of leakage.   4.  Hypertension. -Patient's systolic blood pressures elevated in the 160-170 range -Will provide as needed labetalol 10 mg every 2 hours for systolic blood pressures are in the 165  Code Status: Full code Family Communication: Family not present Disposition Plan: Anticipate discharge on medically stable   Procedures:  Anterior cervical discectomy and fusion, cervical plate and screws. Procedure performed on 03/03/2015 Direct laryngoscopy with repair of hypopharyngeal laceration performed on 03/03/2015  Antibiotics:  Vancomycin  Zosyn  HPI/Subjective: Carolyn [name] is a pleasant 49 year old female who was admitted to the orthopedic service on 03/03/2015 to undergo cervical discectomy. She has a history of C3-4 severe cervical stenosis at 6.9 mm. Physical examination had revealed lower extremity hyperreflexia. She was taken to the operating room on 03/03/2015 undergoing anterior cervical discectomy and fusion, cervical plate and screws. Intraoperatively with placement of NG tube there were concerns of esophageal tear. Dr. Lowella Petties of ENT was consulted. Dr. Lowella Petties performed performed direct laryngoscopy and repaired hypopharyngeal laceration. Postoperative course was complicated by the development of pneumonia. Less revealed upward trend in her white count to 15,100 on 03/05/2015 as chest x-ray revealed a left lower lobe pneumonia. She was started on broad-spectrum IV antimicrobial therapy with vancomycin and Zosyn.  Objective: Filed Vitals:   03/09/15 0959  BP: 160/97  Pulse: 100  Temp: 98.6 F (37 C)  Resp: 18    Intake/Output Summary (Last 24 hours) at 03/09/15 1323 Last data filed at 03/09/15 1056  Gross per 24 hour  Intake     50 ml  Output      0 ml  Net     50 ml   Filed Weights   03/07/15 0500 03/08/15 0500 03/09/15 0500  Weight: 98.521 kg (217 lb 3.2 oz)  97.977 kg (216 lb) 99.026 kg (218 lb 5 oz)    Exam:   General:  Patient appears  generally well, no acute distress. Breathing comfortably on room air.  Cardiovascular: Tachycardic regular rate rhythm normal S1-S2  Respiratory: Normal respiratory effort, has bilateral crackles, coarse upper respiratory sounds  Abdomen: Soft nontender nondistended  Musculoskeletal: Neck brace in place  Data Reviewed: Basic Metabolic Panel:  Recent Labs Lab 03/05/15 0521 03/06/15 0610 03/07/15 0515 03/09/15 1119  NA 135 138 137 138  K 3.8 3.3* 3.6 3.9  CL 99* 98* 100* 99*  CO2 28 30 29 27   GLUCOSE 105* 114* 109* 106*  BUN 11 12 13 14   CREATININE 0.76 0.69 0.73 1.43*  CALCIUM 8.7* 8.8* 8.6* 9.3   Liver Function Tests: No results for input(s): AST, ALT, ALKPHOS, BILITOT, PROT, ALBUMIN in the last 168 hours. No results for input(s): LIPASE, AMYLASE in the last 168 hours. No results for input(s): AMMONIA in the last 168 hours. CBC:  Recent Labs Lab 03/05/15 0521 03/06/15 0610 03/07/15 0515 03/09/15 1119  WBC 15.1* 11.0* 8.7 11.9*  NEUTROABS 11.8*  --   --  9.4*  HGB 12.5 10.9* 10.0* 10.9*  HCT 35.9* 32.6* 30.0* 30.9*  MCV 88.2 87.6 87.5 85.6  PLT 214 192 190 211   Cardiac Enzymes: No results for input(s): CKTOTAL, CKMB, CKMBINDEX, TROPONINI in the last 168 hours. BNP (last 3 results)  Recent Labs  03/05/15 1907  BNP 63.3    ProBNP (last 3 results) No results for input(s): PROBNP in the last 8760 hours.  CBG:  Recent Labs Lab 03/08/15 2112 03/09/15 0024 03/09/15 0542 03/09/15 0826 03/09/15 1151  GLUCAP 84 101* 83 106* 84    Recent Results (from the past 240 hour(s))  Culture, blood (routine x 2) Call MD if unable to obtain prior to antibiotics being given     Status: None (Preliminary result)   Collection Time: 03/05/15  2:42 PM  Result Value Ref Range Status   Specimen Description BLOOD LEFT ARM  Final   Special Requests BOTTLES DRAWN AEROBIC ONLY 3CC  Final   Culture NO GROWTH 3 DAYS  Final   Report Status PENDING  Incomplete  Culture,  blood (routine x 2) Call MD if unable to obtain prior to antibiotics being given     Status: None (Preliminary result)   Collection Time: 03/05/15  2:50 PM  Result Value Ref Range Status   Specimen Description BLOOD LEFT HAND  Final   Special Requests BOTTLES DRAWN AEROBIC ONLY 1CC  Final   Culture NO GROWTH 3 DAYS  Final   Report Status PENDING  Incomplete  Culture, sputum-assessment     Status: None   Collection Time: 03/05/15  5:00 PM  Result Value Ref Range Status   Specimen Description SPUTUM  Final   Special Requests NONE  Final   Sputum evaluation   Final    MICROSCOPIC FINDINGS SUGGEST THAT THIS SPECIMEN IS NOT REPRESENTATIVE OF LOWER RESPIRATORY SECRETIONS. PLEASE RECOLLECT. CALLED TO S.ASIDI,RN AT 1925 BY L.PITT 03/05/15    Report Status 03/05/2015 FINAL  Final  Culture, expectorated sputum-assessment     Status: None   Collection Time: 03/06/15 12:30 AM  Result Value Ref Range Status   Specimen Description SPUTUM  Final   Special Requests NONE  Final   Sputum evaluation   Final    MICROSCOPIC FINDINGS SUGGEST THAT THIS SPECIMEN IS NOT REPRESENTATIVE OF LOWER RESPIRATORY SECRETIONS. PLEASE RECOLLECT. Gram Stain Report Called to,Read Back  By and Verified With: [facility] @0129  03/06/15 MKELLY    Report Status 03/06/2015 FINAL  Final     Studies: Dg Esophagus W/water Sol Cm  03/08/2015  CLINICAL DATA:  49 year old female with esophageal/pharyngeal injury and repair. Evaluate for leak. EXAM: ESOPHOGRAM/BARIUM SWALLOW TECHNIQUE: Single contrast examination was performed using water-soluble Omnipaque. FLUOROSCOPY TIME:  Fluoroscopy Time:  1 minute 0 seconds. Number of Acquired Images:  166 COMPARISON:  None FINDINGS: Water-soluble Omnipaque was administered. Only a limited amount of contrast was ingested due to aspiration/coughing. Narrowing and mild irregularity of the very proximal aspect of the esophagus/distal pharynx noted. No definite leak/perforation noted on this examination  although limited secondary to small amounts of contrast tolerated. There is a moderate amount of aspiration identified. IMPRESSION: Narrowing and mild irregularity of the very proximal esophagus/ distal pharynx which may be related to postoperative changes. No definite leak/ perforation identified on this examination, but limited examination secondary to small amounts of contrast tolerated. Moderate aspiration. Electronically Signed   By: Margarette Canada M.D.   On: 03/08/2015 12:49    Scheduled Meds: . antiseptic oral rinse  7 mL Mouth Rinse BID  . chlorhexidine  15 mL Mouth Rinse BID  . cycloSPORINE  1 drop Both Eyes BID  . feeding supplement (PRO-STAT SUGAR FREE 64)  30 mL Per Tube BID  . lidocaine  1 application Tracheal Tube Once  . lidocaine  15 mL Mouth/Throat Once  . pantoprazole (PROTONIX) IV  40 mg Intravenous Q12H  . piperacillin-tazobactam (ZOSYN)  IV  3.375 g Intravenous 3 times per day  . sodium chloride  1,000 mL Intravenous Once  . sodium chloride  1,000 mL Intravenous Once  . sodium chloride  3 mL Intravenous Q12H  . triamcinolone  1 spray Each Nare BID  . vancomycin  1,000 mg Intravenous Q8H   Continuous Infusions: . sodium chloride    . sodium chloride    . feeding supplement (OSMOLITE 1.2 CAL) 1,000 mL (03/06/15 0920)    Active Problems:   Hypertension   HNP (herniated nucleus pulposus) with myelopathy, cervical   Spinal stenosis in cervical region   S/P cervical spinal fusion   Esophageal tear   Family history of DVT   HCAP (healthcare-associated pneumonia)   Bilateral lower extremity edema   Cough    Time spent: 15 min    Kelvin Cellar  [facility] Pager [phone]. If 7PM-7AM, please contact night-coverage at [email or web address], password [facility] 03/09/2015, 1:23 PM  LOS: 6 days

## 2015-03-09 NOTE — Evaluation (Signed)
Clinical/Bedside Swallow Evaluation Patient Details  Name: DONETTE SLAPE MRN: [id number] Date of Birth: 1965-12-09  Today's Date: 03/09/2015 Time: SLP Start Time (ACUTE ONLY): 0900 SLP Stop Time (ACUTE ONLY): 0925 SLP Time Calculation (min) (ACUTE ONLY): 25 min  Past Medical History:  Past Medical History  Diagnosis Date  . Hypertension     takes Hyzaar daily  . Neck pain     bone spur on esophagus  . Headache(784.0)     MRI in 2007 bc of headaches  . Arthritis     patient unsure  . Urinary frequency   . Heart murmur     New diagnosis  . Dry eyes     uses Restasis bid  . Depression     but doesn't require any meds  . Diffuse idiopathic skeletal hyperostosis   . PONV (postoperative nausea and vomiting)     nausea and vomiting   . Anxiety   . GERD (gastroesophageal reflux disease)    Past Surgical History:  Past Surgical History  Procedure Laterality Date  . Abdominal hysterectomy  2011    bleeding  . Anterior cervical decomp/discectomy fusion N/A 08/21/2012    Procedure: Excision of exostosis C2-3,C3-4, C4-5;  Surgeon: Jessy Oto, MD;  Location: [place];  Service: Orthopedics;  Laterality: N/A;  . Anterior cervical decomp/discectomy fusion N/A 03/03/2015    Procedure: ANTERIOR CERVICAL DISCECTOMY AND FUSION WITH PARTIAL VERTEBRECTOMY C3 AND C4, CERVICAL PLATE AND SCREWS, ALLOGRAFT, LOCAL BONE GRAFT, VIVIGEN;  Surgeon: Jessy Oto, MD;  Location: [place];  Service: Orthopedics;  Laterality: N/A;  . Direct laryngoscopy  03/03/2015    Procedure: DIRECT LARYNGOSCOPY AND PLACEMENT OF FEEDING TUBE;  Surgeon: [name], MD;  Location: [place];  Service: ENT;;  . Repair of esophagus  03/03/2015    Procedure: REPAIR OF ESOPHAGUS;  Surgeon: [name], MD;  Location: [place];  Service: ENT;;   HPI:  Patient is a 60 year oldfemale. In 2014 she had removal of exostoses over the anterior aspect of the cervical spine at C2-3, 3-4 and C4-5.On 03/03/15 pt had an anterior cervical  disectomy and fustion with partial vertebrectomy C3 and C4, cervical plate and screws, and repair of esophagus, there was a 2cm tear near the pyrifrom sinus. CXR on 03/06/15 showed stable mild basilar atelectasis and small left effusion. Pt had a DG esophagus water soluable assessment indicated significant swelling, reduced epiglottic deflection causing moderate aspiration. Pt currently NPO for BSE.    Assessment / Plan / Recommendation Clinical Impression  Pt demonstrated moderate pharyngeal dysphagia with overt s/s of aspiration (delayed cough) when presented with ice chips, likely caused by significant swelling post op C3-4 fusion, and poor esophageal deflection as seen on esophagram. SLP provided pt with education and showed how her swelling was causing aspiration. SLP recommends pt remain NPO with the exception of ice chips following oral care. SLP instructed pt to have 1 ice chip every minute for up to 30 minutes to help reduce swelling and engage swallowing function. Pt is to stop ice chip trials if coughing significantly. Pt verbalized understanding. SLP will f/u with po trials and an objective swallow study (MBS) when pt demonstrates fewer s/s of aspiration, reduced swelling, and improved vocal quality. Made efforts to contact ENT and MD regarding medical management of swelling.    Aspiration Risk  Moderate aspiration risk    Diet Recommendation     Medication Administration: Via alternative means    Other  Recommendations Oral Care Recommendations: Oral care  QID;Oral care prior to ice chip/H20;Patient independent with oral care Other Recommendations: Have oral suction available   Follow up Recommendations       Frequency and Duration min 3x week  2 weeks       Swallow Study   General HPI: Patient is a 48 year oldfemale. In 2014 she had removal of exostoses over the anterior aspect of the cervical spine at C2-3, 3-4 and C4-5.On 03/03/15 pt had an anterior cervical disectomy and  fustion with partial vertebrectomy C3 and C4, cervical plate and screws, and repair of esophagus, there was a 2cm tear near the pyrifrom sinus. CXR on 03/06/15 showed stable mild basilar atelectasis and small left effusion. Pt had a DG esophagus water soluable assessment indicated significant swelling, reduced epiglottic deflection causing moderate aspiration. Pt currently NPO for BSE.  Type of Study: Bedside Swallow Evaluation Diet Prior to this Study: NPO Temperature Spikes Noted: No History of Recent Intubation: Yes Length of Intubations (days):  (less than 1 day for procedure) Date extubated: 03/03/15 Behavior/Cognition: Alert;Cooperative;Pleasant mood Oral Cavity Assessment: Within Functional Limits Oral Care Completed by SLP: No Oral Cavity - Dentition: Adequate natural dentition Vision: Functional for self-feeding Self-Feeding Abilities: Able to feed self Patient Positioning: Upright in bed (EOB) Baseline Vocal Quality: Hoarse;Low vocal intensity Volitional Cough: Congested Volitional Swallow: Able to elicit    Oral/Motor/Sensory Function Overall Oral Motor/Sensory Function: Within functional limits   Ice Chips Ice chips: Impaired Presentation: Self Fed Pharyngeal Phase Impairments: Throat Clearing - Delayed   Thin Liquid Thin Liquid: Not tested    Nectar Thick Nectar Thick Liquid: Not tested   Honey Thick Honey Thick Liquid: Not tested   Puree Puree: Not tested   Solid Solid: Not tested      Lanier Ensign, Student-SLP  Lanier Ensign 03/09/2015,9:50 AM

## 2015-03-09 NOTE — Progress Notes (Signed)
Left anterior neck with swelling, not warm, no significant incision drainage. WBC 11.7K today. CRP is 4.7 and sed rate is 86. If pneumonia, could explain sed rate, recent surgery disc with increased CRP. WBC has increased c/w last lab from Saturday. Cr is slightly elevated. Lateral xray for swallow study with retroesophageal swelling of soft tissue. Ordered an Ultrasound to evaluate and aspirate if a fluid collection is found in the left neck. Await the results, should send for gram stain stat, C&S anaerobic and aerobic. Repeat CBC with diff in the AM and CRP.

## 2015-03-09 NOTE — Progress Notes (Addendum)
BG at 0800 is 106.  Kandie Keiper, RN.

## 2015-03-09 NOTE — Progress Notes (Signed)
Carolyn [name] (Dr. Otho Ket rep) called RN to order labs of CBC with diff, B Met, CRP, and Sed rate. Lab ordered.  [name], RN

## 2015-03-09 NOTE — Progress Notes (Signed)
     Subjective: 6 Days Post-Op Procedure(s) (LRB): ANTERIOR CERVICAL DISCECTOMY AND FUSION WITH PARTIAL VERTEBRECTOMY C3 AND C4, CERVICAL PLATE AND SCREWS, ALLOGRAFT, LOCAL BONE GRAFT, VIVIGEN (N/A) DIRECT LARYNGOSCOPY AND PLACEMENT OF FEEDING TUBE REPAIR OF ESOPHAGUS Awake alert oriented 4. Yesterday she underwent a barium swallow study demonstrating aspiration. No sign of recurrent leakage from the esophagus. The esophagus noted to be irregular in the area of 34. Patient reports pain as moderate.    Objective:   VITALS:  Temp:  [97.5 F (36.4 C)-98.6 F (37 C)] 98.6 F (37 C) (11/14 0959) Pulse Rate:  [52-100] 100 (11/14 0959) Resp:  [16-20] 18 (11/14 0959) BP: (134-170)/(72-97) 160/97 mmHg (11/14 0959) SpO2:  [96 %-100 %] 96 % (11/14 0959) Weight:  [99.026 kg (218 lb 5 oz)] 99.026 kg (218 lb 5 oz) (11/14 0500)  Neurologically intact ABD soft Neurovascular intact Sensation intact distally Intact pulses distally Dorsiflexion/Plantar flexion intact Incision: scant drainage Left neck showing some mild swelling today increase from Saturday.   LABS  Recent Labs  03/07/15 0515 03/09/15 1119  HGB 10.0* 10.9*  WBC 8.7 11.9*  PLT 190 211    Recent Labs  03/07/15 0515 03/09/15 1119  NA 137 138  K 3.6 3.9  CL 100* 99*  CO2 29 27  BUN 13 14  CREATININE 0.73 1.43*  GLUCOSE 109* 106*   No results for input(s): LABPT, INR in the last 72 hours.   Assessment/Plan: 6 Days Post-Op Procedure(s) (LRB): ANTERIOR CERVICAL DISCECTOMY AND FUSION WITH PARTIAL VERTEBRECTOMY C3 AND C4, CERVICAL PLATE AND SCREWS, ALLOGRAFT, LOCAL BONE GRAFT, VIVIGEN (N/A) DIRECT LARYNGOSCOPY AND PLACEMENT OF FEEDING TUBE REPAIR OF ESOPHAGUS  Left lower lobe pneumonia Perioperative acute blood loss anemia Aspiration secondary to esophageal dyskinesis.  Up with therapy Continue ABX therapy due to Post-op infection pneumonia and prophylaxis following esophageal tear. We'll obtain laboratory  tests CBC with differential sedimentation rate and C reactive protein. Also obtained be met. Consider ultrasound and left neck to assess for any fluid collection and consider aspiration using ultrasound. Shante Archambeault E 03/09/2015, 12:34 PM

## 2015-03-09 NOTE — Progress Notes (Signed)
Physical Therapy Discharge Patient Details Name: Carolyn [name] MRN: [id number] DOB: Nov 21, 1965 Today's Date: 03/09/2015 Time:  -     Patient discharged from PT services secondary to goals met and no further PT needs identified. (Pt up in room independently on arrival. Reports she is walking in halls independently. Discussed goal for up 3 steps with no rail and she reports she is not concerned re: stairs and will have assistance. She politely deferred stair training.)  Please see latest therapy progress note for current level of functioning and progress toward goals.    Progress and discharge plan discussed with patient and/or caregiver: Patient/Caregiver agrees with plan  GP     [name]  Pager [phone]  03/09/2015, 3:01 PM

## 2015-03-10 ENCOUNTER — Inpatient Hospital Stay (HOSPITAL_COMMUNITY): Payer: BC Managed Care – PPO

## 2015-03-10 ENCOUNTER — Other Ambulatory Visit (HOSPITAL_COMMUNITY): Payer: Self-pay | Admitting: Specialist

## 2015-03-10 DIAGNOSIS — R1310 Dysphagia, unspecified: Secondary | ICD-10-CM

## 2015-03-10 DIAGNOSIS — N179 Acute kidney failure, unspecified: Secondary | ICD-10-CM

## 2015-03-10 LAB — GLUCOSE, CAPILLARY
GLUCOSE-CAPILLARY: 79 mg/dL (ref 65–99)
Glucose-Capillary: 69 mg/dL (ref 65–99)
Glucose-Capillary: 133 mg/dL — ABNORMAL HIGH (ref 65–99)
Glucose-Capillary: 144 mg/dL — ABNORMAL HIGH (ref 65–99)

## 2015-03-10 LAB — VANCOMYCIN, RANDOM: Vancomycin Rm: 19 ug/mL

## 2015-03-10 LAB — CULTURE, BLOOD (ROUTINE X 2)
CULTURE: NO GROWTH
CULTURE: NO GROWTH

## 2015-03-10 LAB — CBC
HEMATOCRIT: 31.5 % — AB (ref 36.0–46.0)
Hemoglobin: 10.7 g/dL — ABNORMAL LOW (ref 12.0–15.0)
MCH: 29.2 pg (ref 26.0–34.0)
MCHC: 34 g/dL (ref 30.0–36.0)
MCV: 86.1 fL (ref 78.0–100.0)
PLATELETS: 206 10*3/uL (ref 150–400)
RBC: 3.66 MIL/uL — ABNORMAL LOW (ref 3.87–5.11)
RDW: 13.3 % (ref 11.5–15.5)
WBC: 10.5 10*3/uL (ref 4.0–10.5)

## 2015-03-10 LAB — BASIC METABOLIC PANEL
Anion gap: 10 (ref 5–15)
BUN: 15 mg/dL (ref 6–20)
CALCIUM: 8.8 mg/dL — AB (ref 8.9–10.3)
CO2: 22 mmol/L (ref 22–32)
CREATININE: 1.49 mg/dL — AB (ref 0.44–1.00)
Chloride: 108 mmol/L (ref 101–111)
GFR calc Af Amer: 47 mL/min — ABNORMAL LOW (ref >60)
GFR, EST NON AFRICAN AMERICAN: 40 mL/min — AB (ref >60)
GLUCOSE: 84 mg/dL (ref 65–99)
Potassium: 3.8 mmol/L (ref 3.5–5.1)
Sodium: 140 mmol/L (ref 135–145)

## 2015-03-10 LAB — C-REACTIVE PROTEIN: CRP: 5.3 mg/dL — AB (ref <1.0)

## 2015-03-10 MED ORDER — DEXTROSE-NACL 5-0.45 % IV SOLN
INTRAVENOUS | Status: DC
  Administered 2015-03-10 (×2): via INTRAVENOUS

## 2015-03-10 MED ORDER — PRO-STAT SUGAR FREE PO LIQD
30.0000 mL | Freq: Two times a day (BID) | ORAL | Status: DC
  Administered 2015-03-10 – 2015-03-11 (×2): 30 mL via ORAL
  Filled 2015-03-10 (×2): qty 30

## 2015-03-10 MED ORDER — VANCOMYCIN HCL IN DEXTROSE 750-5 MG/150ML-% IV SOLN
750.0000 mg | Freq: Two times a day (BID) | INTRAVENOUS | Status: DC
  Administered 2015-03-10 – 2015-03-13 (×6): 750 mg via INTRAVENOUS
  Filled 2015-03-10 (×6): qty 150

## 2015-03-10 MED ORDER — ENSURE ENLIVE PO LIQD
237.0000 mL | Freq: Two times a day (BID) | ORAL | Status: DC
  Administered 2015-03-11 – 2015-03-13 (×5): 237 mL via ORAL
  Filled 2015-03-10 (×8): qty 237

## 2015-03-10 MED ORDER — RESOURCE THICKENUP CLEAR PO POWD
ORAL | Status: DC | PRN
  Filled 2015-03-10 (×2): qty 125

## 2015-03-10 MED ORDER — ADULT MULTIVITAMIN LIQUID CH
5.0000 mL | Freq: Every day | ORAL | Status: DC
  Administered 2015-03-11 – 2015-03-13 (×3): 5 mL via ORAL
  Filled 2015-03-10 (×4): qty 5

## 2015-03-10 NOTE — Progress Notes (Signed)
[facility] PROGRESS NOTE  Carolyn [name] [id number] DOB: Aug 05, 1965 DOA: 03/03/2015 PCP: Rory Percy, MD  Assessment/Plan: 1. Healthcare associated pneumonia versus aspiration pneumonia -Carolyn [name] undergoing cervical discectomy and fusion on 03/03/2015. -Postoperatively patient having deterioration in respiratory status with elevation of white count to 15,100. She was further worked up with chest x-ray that revealed development of left lower lobe pneumonia and started on empiric IV antimicrobial therapy with vancomycin and Zosyn. -Blood cultures that were drawn on 03/05/2015 showing no growth to date. -She remains afebrile -Lab showing lactate of 1.1 on 03/09/2015. -She was started on Vanc + Zosyn on 03/05/2015 (day 5), would recommend completing 7 days of antibiotic therapy.   2.  Acute kidney injury -Labs on 03/09/2015 showing creatinine 1.43, patient receiving IV fluids overnight, repeat BMP on 03/10/2015 showing creatinine 1.49. -Would recommend continuing IV fluid resuscitation, follow-up on a.m. lab work.  3.  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 -Intraoperatively she had an esophageal tear with placement of NG tube. -Dr. Lowella Petties of ENT consulted who performed direct laryngoscopy and repair of laceration. - Ultrasound of neck performed on 03/10/2015 did not show reveal drainable fluid collection within the anterior neck.   3.  Esophageal tear -As mentioned above this was a complication of recent cervical discectomy procedure. -ENT repairing laceration -Gastrografin swallow evaluation on 03/08/2015 did not show evidence of leakage.  -Patient evaluated by SLP, plan to proceed with barium swallow to assess for possible initiation of diet  4.  Hypertension. -Patient's systolic blood pressures in the 150's  -Will provide as needed labetalol 10 mg  every 2 hours for systolic blood pressures are in the 165  Code Status: Full code Family Communication: Family not present Disposition Plan: Anticipate discharge on medically stable   Procedures:  Anterior cervical discectomy and fusion, cervical plate and screws. Procedure performed on 03/03/2015 Direct laryngoscopy with repair of hypopharyngeal laceration performed on 03/03/2015  Antibiotics:  Vancomycin (day 5)  Zosyn (day 5)  HPI/Subjective: Carolyn [name] is a pleasant 49 year old female who was admitted to the orthopedic service on 03/03/2015 to undergo cervical discectomy. She has a history of C3-4 severe cervical stenosis at 6.9 mm. Physical examination had revealed lower extremity hyperreflexia. She was taken to the operating room on 03/03/2015 undergoing anterior cervical discectomy and fusion, cervical plate and screws. Intraoperatively with placement of NG tube there were concerns of esophageal tear. Dr. Lowella Petties of ENT was consulted. Dr. Lowella Petties performed performed direct laryngoscopy and repaired hypopharyngeal laceration. Postoperative course was complicated by the development of pneumonia. Less revealed upward trend in her white count to 15,100 on 03/05/2015 as chest x-ray revealed a left lower lobe pneumonia. She was started on broad-spectrum IV antimicrobial therapy with vancomycin and Zosyn.  Objective: Filed Vitals:   03/10/15 0944  BP: 151/73  Pulse: 68  Temp: 99.1 F (37.3 C)  Resp: 18    Intake/Output Summary (Last 24 hours) at 03/10/15 1235 Last data filed at 03/10/15 0800  Gross per 24 hour  Intake 8910.09 ml  Output      0 ml  Net 8910.09 ml   Filed Weights   03/08/15 0500 03/09/15 0500 03/10/15 0535  Weight: 97.977 kg (216 lb) 99.026 kg (218 lb 5 oz) 80.287 kg (177 lb)    Exam:   General:  Patient appears generally well, no acute distress. Breathing comfortably on room air.  Cardiovascular: Tachycardic regular rate  rhythm normal  S1-S2  Respiratory: Normal respiratory effort, has bilateral crackles, coarse upper respiratory sounds  Abdomen: Soft nontender nondistended  Musculoskeletal: Neck brace in place  Data Reviewed: Basic Metabolic Panel:  Recent Labs Lab 03/05/15 0521 03/06/15 0610 03/07/15 0515 03/09/15 1119 03/10/15 0650  NA 135 138 137 138 140  K 3.8 3.3* 3.6 3.9 3.8  CL 99* 98* 100* 99* 108  CO2 28 30 29 27 22   GLUCOSE 105* 114* 109* 106* 84  BUN 11 12 13 14 15   CREATININE 0.76 0.69 0.73 1.43* 1.49*  CALCIUM 8.7* 8.8* 8.6* 9.3 8.8*   Liver Function Tests: No results for input(s): AST, ALT, ALKPHOS, BILITOT, PROT, ALBUMIN in the last 168 hours. No results for input(s): LIPASE, AMYLASE in the last 168 hours. No results for input(s): AMMONIA in the last 168 hours. CBC:  Recent Labs Lab 03/05/15 0521 03/06/15 0610 03/07/15 0515 03/09/15 1119 03/10/15 0650  WBC 15.1* 11.0* 8.7 11.9* 10.5  NEUTROABS 11.8*  --   --  9.4*  --   HGB 12.5 10.9* 10.0* 10.9* 10.7*  HCT 35.9* 32.6* 30.0* 30.9* 31.5*  MCV 88.2 87.6 87.5 85.6 86.1  PLT 214 192 190 211 206   Cardiac Enzymes: No results for input(s): CKTOTAL, CKMB, CKMBINDEX, TROPONINI in the last 168 hours. BNP (last 3 results)  Recent Labs  03/05/15 1907  BNP 63.3    ProBNP (last 3 results) No results for input(s): PROBNP in the last 8760 hours.  CBG:  Recent Labs Lab 03/09/15 1151 03/09/15 1600 03/09/15 2126 03/10/15 0027 03/10/15 0835  GLUCAP 84 102* 92 79 69    Recent Results (from the past 240 hour(s))  Culture, blood (routine x 2) Call MD if unable to obtain prior to antibiotics being given     Status: None (Preliminary result)   Collection Time: 03/05/15  2:42 PM  Result Value Ref Range Status   Specimen Description BLOOD LEFT ARM  Final   Special Requests BOTTLES DRAWN AEROBIC ONLY 3CC  Final   Culture NO GROWTH 4 DAYS  Final   Report Status PENDING  Incomplete  Culture, blood (routine x 2) Call MD if unable to  obtain prior to antibiotics being given     Status: None (Preliminary result)   Collection Time: 03/05/15  2:50 PM  Result Value Ref Range Status   Specimen Description BLOOD LEFT HAND  Final   Special Requests BOTTLES DRAWN AEROBIC ONLY 1CC  Final   Culture NO GROWTH 4 DAYS  Final   Report Status PENDING  Incomplete  Culture, sputum-assessment     Status: None   Collection Time: 03/05/15  5:00 PM  Result Value Ref Range Status   Specimen Description SPUTUM  Final   Special Requests NONE  Final   Sputum evaluation   Final    MICROSCOPIC FINDINGS SUGGEST THAT THIS SPECIMEN IS NOT REPRESENTATIVE OF LOWER RESPIRATORY SECRETIONS. PLEASE RECOLLECT. CALLED TO S.ASIDI,RN AT 1925 BY L.PITT 03/05/15    Report Status 03/05/2015 FINAL  Final  Culture, expectorated sputum-assessment     Status: None   Collection Time: 03/06/15 12:30 AM  Result Value Ref Range Status   Specimen Description SPUTUM  Final   Special Requests NONE  Final   Sputum evaluation   Final    MICROSCOPIC FINDINGS SUGGEST THAT THIS SPECIMEN IS NOT REPRESENTATIVE OF LOWER RESPIRATORY SECRETIONS. PLEASE RECOLLECT. Gram Stain Report Called to,Read Back By and Verified With: [facility] @0129  03/06/15 MKELLY    Report Status 03/06/2015 FINAL  Final     Studies: US Soft Tissue Head/neck  03/10/2015  CLINICAL DATA:  History of neck swelling following ACDF. Evaluate for definable/drainable fluid collection. EXAM: ULTRASOUND OF HEAD/NECK SOFT TISSUES TECHNIQUE: Ultrasound examination of the head and neck soft tissues was performed in the area of clinical concern. COMPARISON:  None. FINDINGS: Provided grayscale images of the patient's area of concern involving the anterior neck are negative for discrete solid or cystic mass or fluid collection. This was confirmed with real-time sonographic evaluation performed by the dictating radiologist. IMPRESSION: No definable / drainable fluid collection visualized within the anterior neck. If clinical  concern persists, further evaluation could be performed with contrast-enhanced neck CT as indicated. Electronically Signed   By: Sandi Mariscal M.D.   On: 03/10/2015 10:59    Scheduled Meds: . antiseptic oral rinse  7 mL Mouth Rinse BID  . chlorhexidine  15 mL Mouth Rinse BID  . cycloSPORINE  1 drop Both Eyes BID  . feeding supplement (PRO-STAT SUGAR FREE 64)  30 mL Per Tube BID  . pantoprazole (PROTONIX) IV  40 mg Intravenous Q12H  . piperacillin-tazobactam (ZOSYN)  IV  3.375 g Intravenous 3 times per day  . sodium chloride  1,000 mL Intravenous Once  . sodium chloride  3 mL Intravenous Q12H  . triamcinolone  1 spray Each Nare BID   Continuous Infusions: . sodium chloride    . dextrose 5 % and 0.45% NaCl 125 mL/hr at 03/10/15 1009  . feeding supplement (OSMOLITE 1.2 CAL) 1,000 mL (03/06/15 0920)    Active Problems:   Hypertension   HNP (herniated nucleus pulposus) with myelopathy, cervical   Spinal stenosis in cervical region   S/P cervical spinal fusion   Esophageal tear   Family history of DVT   HCAP (healthcare-associated pneumonia)   Bilateral lower extremity edema   Cough    Time spent: 15 min    Kelvin Cellar  [facility] Pager [phone]. If 7PM-7AM, please contact night-coverage at [email or web address], password [facility] 03/10/2015, 12:35 PM  LOS: 7 days

## 2015-03-10 NOTE — Progress Notes (Signed)
Patient continues with swallowing difficulties and IV abx. Plan is to do [place] of neck today d/t some soft tissue swelling. CM will continue to follow for discharge needs.

## 2015-03-10 NOTE — Progress Notes (Signed)
Speech Language Pathology Treatment: Dysphagia  Patient Details Name: Carolyn [name] MRN: [id number] DOB: April 28, 1965 Today's Date: 03/10/2015 Time: 1350-1405 SLP Time Calculation (min) (ACUTE ONLY): 15 min  Assessment / Plan / Recommendation Clinical Impression  Dysphagia treatment focused on educating pt and pt's husband about thickening consistencies to nectar-thick. SLP modeled thickening and then pt demonstrated understanding. During therapeutic exercise SLP provided moderate fading to minimal cues to perform an effortful swallow, cough/clear throat, and then swallow again. SLP also provided moderate cues for pt to clear throat as pt's voice becomes wet from penetrating/aspirating secretions due to decreased sensation. SLP suspects pt's swallow function will improve as swelling decreases. SLP provided recommendations to Dr. Erik Obey and Dr. Coralyn Pear, pt says Dr. Louanne [name] will round this evening. SLP scheduled pt for an outpatient MBS on Wednesday, November 23rd at 11:30. SLP will continue with treatment for diet tolerance while pt is in acute care at hospital.   HPI HPI: Patient is a 13 year oldfemale. In 2014 she had removal of exostoses over the anterior aspect of the cervical spine at C2-3, 3-4 and C4-5.On 03/03/15 pt had an anterior cervical disectomy and fustion with partial vertebrectomy C3 and C4, cervical plate and screws, and repair of esophagus, there was a 2cm tear near the pyrifrom sinus. CXR on 03/06/15 showed stable mild basilar atelectasis and small left effusion. Pt had a DG esophagus water soluable assessment indicated significant swelling, reduced epiglottic deflection causing moderate aspiration. Pt currently NPO for BSE.       SLP Plan  Continue with current plan of care     Recommendations  Diet recommendations: Nectar-thick liquid Liquids provided via: Cup;No straw Medication Administration: Other (Comment) (crushed with Nectar) Supervision: Patient able to self  feed;Intermittent supervision to cue for compensatory strategies Compensations: Slow rate;Small sips/bites;Effortful swallow;Clear throat after each swallow;Multiple dry swallows after each bite/sip Postural Changes and/or Swallow Maneuvers: Seated upright 90 degrees              Oral Care Recommendations: Oral care BID Plan: Continue with current plan of care  Lanier Ensign, Student-SLP  Lanier Ensign 03/10/2015, 2:28 PM

## 2015-03-10 NOTE — Progress Notes (Signed)
ANTIBIOTIC CONSULT NOTE - FOLLOW UP  Pharmacy Consult for Vancomycin and Zosyn Indication: pneumonia, HCAP vs aspiration  No Known Allergies  Patient Measurements: Height: 5' 8" (172.7 cm) Weight: 177 lb (80.287 kg) IBW/kg (Calculated) : 63.9  Vital Signs: Temp: 99 F (37.2 C) (11/15 1410) Temp Source: Oral (11/15 1410) BP: 156/78 mmHg (11/15 1410) Pulse Rate: 86 (11/15 1410) Intake/Output from previous day: 11/14 0701 - 11/15 0700 In: 8960.1 [I.V.:8660.1; IV Piggyback:300] Out: -  Intake/Output from this shift:    Labs:  Recent Labs  03/09/15 1119 03/10/15 0650  WBC 11.9* 10.5  HGB 10.9* 10.7*  PLT 211 206  CREATININE 1.43* 1.49*   Estimated Creatinine Clearance: 50.8 mL/min (by C-G formula based on Cr of 1.49).  Recent Labs  03/09/15 2158 03/10/15 1130  VANCOTROUGH 34*  --   VANCORANDOM  --  19    Assessment:    Day # 6 Vancomycin and Zosyn after 3 days of Ancef,  broadening coverage for HCAP vs aspiration.      Creatinine 0.73>1.43>1.49.  IVF added 11/14, currently at 125 ml/hr    Vanc trough level 11/14 pm was 34 mcg/ml, on Vanc 1 gm IV q8hrs. Above goal.  Vanc doses were held.    Random vanc level ~11:30am = 19 mcg/ml. OK to resume Vanc.   Tmax 99, WBC 10.5. Cultures negative to date.  ESR 82 and CRP 4.6>5.3, both up.  Lactic acid 1.1.      Goal of Therapy:  Vancomycin trough level 15-20 mcg/ml  Plan:   Resume Vancomycin with 750 mg IV q12hrs.  Continue Zosyn 3.375 gm IV q8hrs (each over 4 hrs)  Follow renal function, culture data, progress.  Currently planning 7 days of Vanc/Zosyn.  Arty Baumgartner, [place] Pager: [phone] 03/10/2015,3:03 PM

## 2015-03-10 NOTE — Progress Notes (Signed)
SLP Cancellation Note  Patient Details Name: Carolyn [name] MRN: [id number] DOB: November 24, 1965   Cancelled treatment:       Reason Eval/Treat Not Completed: Patient at procedure or test/unavailable. Pt about to have ultrasound of neck. Checked in with Ms. Isaacks. She reports some difficulty tolerating ice chips over the last 24 hours with increased coughing. She also wonders if she "could just drink something."  Will return later today to check pt with sips of water subjectively and possibly follow with further objective assessment depending on pts function. Discussed with Dr. Erik Obey. Will call him to discuss possible feeding tube placement pending results of todays tests.   Herbie [place], [place] CCC-SLP [phone]  Lynann Beaver 03/10/2015, 10:11 AM

## 2015-03-10 NOTE — Progress Notes (Signed)
Pt's BG this Am at 4, pt is asymptomatic. IVF changed to D51/2NS a@125 . Continue to monitor. NGAve Filter, RN

## 2015-03-10 NOTE — Progress Notes (Signed)
Nutrition Follow-up  DOCUMENTATION CODES:   Obesity unspecified  INTERVENTION:  Provide Pro-stat BID, each dose provides 100 kcal and 15 grams of protein Provide Ensure Enlive po BID, each supplement provides 350 kcal and 20 grams of protein Provide liquid Multivitamin daily   NUTRITION DIAGNOSIS:   Inadequate oral intake related to inability to eat as evidenced by NPO status.  Ongoing  GOAL:   Patient will meet greater than or equal to 90% of their needs  Unmet  MONITOR:   Diet advancement, TF tolerance, Weight trends, Labs, I & O's  REASON FOR ASSESSMENT:   Consult Enteral/tube feeding initiation and management  ASSESSMENT:   49 yo wf pt of Dr. Louanne Skye underwent removal of cervical osteophytes several years back. Recently sustained a herniated C3-4 disk. During repair, Dr. Louanne Skye noted esophageal tear.  Pt underwent modified barium swallow evaluation this afternoon and was determined to tolerate nectar-thick liquids. Pt drank 4 ounces of Ensure Enlive and 2 ounces of netcar-thick juice this afternoon. Pt states that the barium didn't sit well with her and she will likely consume more tomorrow. Pt agreeable to receiving Ensure and protein supplement until diet is advanced.  Pt was re-weighed today and is 31 lbs less than admission weight- ? accuracy.   Labs: low hemoglobin  Diet Order:  Diet full liquid Room service appropriate?: Yes; Fluid consistency:: Nectar Thick  Skin:  Wound (see comment) (closed incision on neck)  Last BM:  11/14  Height:   Ht Readings from Last 1 Encounters:  03/03/15 5\' 8"  (1.727 m)    Weight:   Wt Readings from Last 1 Encounters:  03/10/15 177 lb (80.287 kg)    Ideal Body Weight:  63.6 kg  BMI:  Body mass index is 26.92 kg/(m^2).  Estimated Nutritional Needs:   Kcal:  [id number]  Protein:  95-115 gm  Fluid:  1.8-2 L  EDUCATION NEEDS:   No education needs identified at this time  [place], LDN Inpatient  Clinical Dietitian Pager: [phone] After Hours Pager: [phone]

## 2015-03-10 NOTE — Progress Notes (Signed)
     Subjective: 7 Days Post-Op Procedure(s) (LRB): ANTERIOR CERVICAL DISCECTOMY AND FUSION WITH PARTIAL VERTEBRECTOMY C3 AND C4, CERVICAL PLATE AND SCREWS, ALLOGRAFT, LOCAL BONE GRAFT, VIVIGEN (N/A) DIRECT LARYNGOSCOPY AND PLACEMENT OF FEEDING TUBE REPAIR OF ESOPHAGUS Awake, alert and oriented x 4. Some SOB last night. What can we do to improve my swallowing.  Patient reports pain as moderate.    Objective:   VITALS:  Temp:  [97.5 F (36.4 C)-99 F (37.2 C)] 98.1 F (36.7 C) (11/15 0535) Pulse Rate:  [67-106] 67 (11/15 0535) Resp:  [18-20] 18 (11/15 0535) BP: (143-160)/(71-97) 150/79 mmHg (11/15 0535) SpO2:  [96 %-99 %] 98 % (11/15 0535) Weight:  [80.287 kg (177 lb)] 80.287 kg (177 lb) (11/15 0535)  Neurologically intact ABD soft Neurovascular intact Sensation intact distally Intact pulses distally Dorsiflexion/Plantar flexion intact Incision: dressing C/D/I and no drainage No cellulitis present No worsening of neck swelling today.   LABS  Recent Labs  03/09/15 1119 03/10/15 0650  HGB 10.9* 10.7*  WBC 11.9* 10.5  PLT 211 206    Recent Labs  03/09/15 1119 03/10/15 0650  NA 138 140  K 3.9 3.8  CL 99* 108  CO2 27 22  BUN 14 15  CREATININE 1.43* 1.49*  GLUCOSE 106* 84   No results for input(s): LABPT, INR in the last 72 hours.   Assessment/Plan: 7 Days Post-Op Procedure(s) (LRB): ANTERIOR CERVICAL DISCECTOMY AND FUSION WITH PARTIAL VERTEBRECTOMY C3 AND C4, CERVICAL PLATE AND SCREWS, ALLOGRAFT, LOCAL BONE GRAFT, VIVIGEN (N/A) DIRECT LARYNGOSCOPY AND PLACEMENT OF FEEDING TUBE REPAIR OF ESOPHAGUS  WBC 10.5 is normal CRP elevated at 5.3 not significantly greater than yesterday.   Up with therapy Continue ABX therapy due to Post-op infection  Will have ultrasound of the neck to assess for an fluid collection. Consider aspiration if one  Present.  Carolyn [name] 03/10/2015, 8:43 AM

## 2015-03-10 NOTE — Progress Notes (Signed)
Speech Language Pathology Treatment: Dysphagia  Patient Details Name: Carolyn [name] MRN: [id number] DOB: 1965-11-13 Today's Date: 03/10/2015 Time: 1100-1115 SLP Time Calculation (min) (ACUTE ONLY): 15 min  Assessment / Plan / Recommendation Clinical Impression  Pt seen after ultrasound. Provided trials of thin liquids and puree, cough x1 out of 10 small sips. Given reduced subjective signs of aspiration and no complaint from pt regarding globus, will proceed with MBS for further objective assessment of swallowing for possible diet initiation. Pt in agreement.    HPI HPI: Patient is a 43 year oldfemale. In 2014 she had removal of exostoses over the anterior aspect of the cervical spine at C2-3, 3-4 and C4-5.On 03/03/15 pt had an anterior cervical disectomy and fustion with partial vertebrectomy C3 and C4, cervical plate and screws, and repair of esophagus, there was a 2cm tear near the pyrifrom sinus. CXR on 03/06/15 showed stable mild basilar atelectasis and small left effusion. Pt had a DG esophagus water soluable assessment indicated significant swelling, reduced epiglottic deflection causing moderate aspiration. Pt currently NPO for BSE.       SLP Plan  Continue with current plan of care     Recommendations  Diet recommendations: NPO Medication Administration: Via alternative means              Oral Care Recommendations: Oral care QID;Oral care prior to ice chip/H20 Plan: Continue with current plan of care   Larhonda Dettloff, Katherene Ponto 03/10/2015, 11:29 AM

## 2015-03-11 DIAGNOSIS — Z981 Arthrodesis status: Secondary | ICD-10-CM

## 2015-03-11 DIAGNOSIS — J690 Pneumonitis due to inhalation of food and vomit: Secondary | ICD-10-CM

## 2015-03-11 DIAGNOSIS — R05 Cough: Secondary | ICD-10-CM

## 2015-03-11 DIAGNOSIS — S1121XD Laceration without foreign body of pharynx and cervical esophagus, subsequent encounter: Secondary | ICD-10-CM

## 2015-03-11 LAB — BASIC METABOLIC PANEL
ANION GAP: 6 (ref 5–15)
BUN: 9 mg/dL (ref 6–20)
CHLORIDE: 108 mmol/L (ref 101–111)
CO2: 26 mmol/L (ref 22–32)
Calcium: 8.6 mg/dL — ABNORMAL LOW (ref 8.9–10.3)
Creatinine, Ser: 1.3 mg/dL — ABNORMAL HIGH (ref 0.44–1.00)
GFR calc Af Amer: 55 mL/min — ABNORMAL LOW (ref >60)
GFR, EST NON AFRICAN AMERICAN: 47 mL/min — AB (ref >60)
GLUCOSE: 165 mg/dL — AB (ref 65–99)
POTASSIUM: 3.4 mmol/L — AB (ref 3.5–5.1)
Sodium: 140 mmol/L (ref 135–145)

## 2015-03-11 LAB — GLUCOSE, CAPILLARY
Glucose-Capillary: 101 mg/dL — ABNORMAL HIGH (ref 65–99)
Glucose-Capillary: 133 mg/dL — ABNORMAL HIGH (ref 65–99)
Glucose-Capillary: 143 mg/dL — ABNORMAL HIGH (ref 65–99)

## 2015-03-11 LAB — CBC
HEMATOCRIT: 27.6 % — AB (ref 36.0–46.0)
HEMOGLOBIN: 9.4 g/dL — AB (ref 12.0–15.0)
MCH: 29.3 pg (ref 26.0–34.0)
MCHC: 34.1 g/dL (ref 30.0–36.0)
MCV: 86 fL (ref 78.0–100.0)
Platelets: 197 10*3/uL (ref 150–400)
RBC: 3.21 MIL/uL — ABNORMAL LOW (ref 3.87–5.11)
RDW: 13.2 % (ref 11.5–15.5)
WBC: 9.1 10*3/uL (ref 4.0–10.5)

## 2015-03-11 MED ORDER — HYDROCODONE-ACETAMINOPHEN 7.5-325 MG/15ML PO SOLN: 15.0000 mL | ORAL | Status: DC | PRN

## 2015-03-11 NOTE — Progress Notes (Signed)
     Subjective: 8 Days Post-Op Procedure(s) (LRB): ANTERIOR CERVICAL DISCECTOMY AND FUSION WITH PARTIAL VERTEBRECTOMY C3 AND C4, CERVICAL PLATE AND SCREWS, ALLOGRAFT, LOCAL BONE GRAFT, VIVIGEN (N/A) DIRECT LARYNGOSCOPY AND PLACEMENT OF FEEDING TUBE REPAIR OF ESOPHAGUS Awake, alert and oriented x 4. Dressing left neck intact, no drainage. Swelling left neck improved. Given grits this AM and not sure if she should be taking this as she has been told to not take in  Apple sauce. No fever, no chills. Voiding without difficulty. On IVF D51/2NS for fluid replacement Previous hypoglycemia 67, now slightly hyperglycemia. Patient reports pain as mild.    Objective:   VITALS:  Temp:  [97.2 F (36.2 C)-99.5 F (37.5 C)] 98.6 F (37 C) (11/16 1012) Pulse Rate:  [66-86] 67 (11/16 1012) Resp:  [17-20] 17 (11/16 1012) BP: (135-164)/(65-81) 155/79 mmHg (11/16 1012) SpO2:  [97 %-99 %] 97 % (11/16 1012) Weight:  [98.884 kg (218 lb)] 98.884 kg (218 lb) (11/16 0410)  Neurologically intact ABD soft Neurovascular intact Sensation intact distally Intact pulses distally Dorsiflexion/Plantar flexion intact Incision: no drainage No cellulitis present   LABS  Recent Labs  03/09/15 1119 03/10/15 0650 03/11/15 0527  HGB 10.9* 10.7* 9.4*  WBC 11.9* 10.5 9.1  PLT 211 206 197    Recent Labs  03/10/15 0650 03/11/15 0527  NA 140 140  K 3.8 3.4*  CL 108 108  CO2 22 26  BUN 15 9  CREATININE 1.49* 1.30*  GLUCOSE 84 165*   No results for input(s): LABPT, INR in the last 72 hours.   Assessment/Plan: 8 Days Post-Op Procedure(s) (LRB): ANTERIOR CERVICAL DISCECTOMY AND FUSION WITH PARTIAL VERTEBRECTOMY C3 AND C4, CERVICAL PLATE AND SCREWS, ALLOGRAFT, LOCAL BONE GRAFT, VIVIGEN (N/A) DIRECT LARYNGOSCOPY AND PLACEMENT OF FEEDING TUBE REPAIR OF ESOPHAGUS Dysphagia gradually increasing po uptake. Pneumonia Anemia Dehydration Cr is improving.  Up with therapy D/C IV fluids  When she is  stable with po nourishment then she will be ready for d/c home.  Yechiel Erny E 03/11/2015, 1:57 PM

## 2015-03-11 NOTE — Progress Notes (Addendum)
Speech Language Pathology Treatment: Dysphagia  Patient Details Name: Carolyn [name] MRN: [id number] DOB: 1966/01/01 Today's Date: 03/11/2015 Time: [id number] SLP Time Calculation (min) (ACUTE ONLY): 20 min  Assessment / Plan / Recommendation Clinical Impression  Pt seated at EOB, taking meds and drinking ensure via teaspoon. Pt appears to tolerate nectar thick liquids. She reports she prefers to get regular liquids and thicken them herself. She also indicated she has been receiving items such as oatmeal and grits on the full liquid diet. Pt was encouraged to call service response to order specifically what she would like, then could thicken appropriately.  Spouse indicated that pt was unable to do so, given restriction of Nectar Thick Liquids in computer. Given that pt is cognitively intact and has been educated on how to thicken liquids as well as the importance of such, will recommend continuing full liquid, but advancing liquids to thin to allow pt freedom to do this. Discussed with RN and husband, MD paged.   Discussed at length with Dr. Eliseo Squires and RN. At this time, pt's diet will be changed to full liquid with thin liquids, sending thickener on trays for pt to thicken liquids to nectar consistency herself, per Dr. Eliseo Squires.  Pt and spouse have been educated regarding how to thicken liquids. Safe swallow precaution sheet was updated and left with husband. He also requested to keep the original precaution sheet, which SLP had torn up (to avoid confusion). ST to continue to follow pt for diet tolerance and adherence to precautions.  RN aware of above.    HPI HPI: Patient is a 72 year oldfemale. In 2014 she had removal of exostoses over the anterior aspect of the cervical spine at C2-3, 3-4 and C4-5.On 03/03/15 pt had an anterior cervical disectomy and fustion with partial vertebrectomy C3 and C4, cervical plate and screws, and repair of esophagus, there was a 2cm tear near the pyrifrom sinus. CXR on  03/06/15 showed stable mild basilar atelectasis and small left effusion. Pt had a DG esophagus water soluable assessment indicated significant swelling, reduced epiglottic deflection causing moderate aspiration. Pt currently on nectar thick liquids ONLY per MBS.      SLP Plan  Continue with current plan of care     Recommendations  Diet recommendations: Nectar-thick liquid Liquids provided via: Cup;Teaspoon Medication Administration: Other (Comment) (crushed with nectar thick liquid) Supervision: Patient able to self feed;Intermittent supervision to cue for compensatory strategies Compensations: Slow rate;Small sips/bites;Effortful swallow;Clear throat after each swallow;Multiple dry swallows after each bite/sip Postural Changes and/or Swallow Maneuvers: Seated upright 90 degrees             Oral Care Recommendations: Oral care BID Plan: Continue with current plan of care   Shonna Chock 03/11/2015, 11:17 AM  Early Chars B. Quentin O[facility], [place] [phone]

## 2015-03-11 NOTE — Progress Notes (Signed)
[facility] PROGRESS NOTE  JACARI KOLESNIKOV [id number] DOB: 03-Feb-1966 DOA: 03/03/2015 PCP: Rory Percy, MD   Carolyn [name] is a pleasant 49 year old female who was admitted to the orthopedic service on 03/03/2015 to undergo cervical discectomy. She has a history of C3-4 severe cervical stenosis at 6.9 mm. Physical examination had revealed lower extremity hyperreflexia. She was taken to the operating room on 03/03/2015 undergoing anterior cervical discectomy and fusion, cervical plate and screws. Intraoperatively with placement of NG tube there were concerns of esophageal tear. Dr. Lowella Petties of ENT was consulted. Dr. Lowella Petties performed performed direct laryngoscopy and repaired hypopharyngeal laceration. Postoperative course was complicated by the development of pneumonia. Less revealed upward trend in her white count to 15,100 on 03/05/2015 as chest x-ray revealed a left lower lobe pneumonia. She was started on broad-spectrum IV antimicrobial therapy with vancomycin and Zosyn.   Assessment/Plan: 1. Healthcare associated pneumonia versus aspiration pneumonia -Mrs. Starks undergoing cervical discectomy and fusion on 03/03/2015. -Postoperatively patient having deterioration in respiratory status with elevation of white count to 15,100. She was further worked up with chest x-ray that revealed development of left lower lobe pneumonia and started on empiric IV antimicrobial therapy with vancomycin and Zosyn. -Blood cultures that were drawn on 03/05/2015 showing no growth to date. -She remains afebrile -Lab showing lactate of 1.1 on 03/09/2015. -She was started on Vanc + Zosyn on 03/05/2015 (day 6), would recommend completing 7 days of antibiotic therapy.   2.  Acute kidney injury -Would recommend continuing IV fluid resuscitation, follow-up on a.m. lab work.  3.  History of C3-4 severe cervical stenosis -She was initially admitted to the orthopedic service on 03/03/2015 undergoing anterior  cervical discectomy and fusion. -Dr. Lowella Petties of ENT consulted who performed direct laryngoscopy and repair of laceration. - Ultrasound of neck performed on 03/10/2015 did not show reveal drainable fluid collection within the anterior neck.   3.  Esophageal tear -As mentioned above this was a complication of recent cervical discectomy procedure. -ENT repairing laceration -Gastrografin swallow evaluation on 03/08/2015 did not show evidence of leakage.  -DYS diet  4.  Hypertension. -Patient's systolic blood pressures in the 150's  -Will provide as needed labetalol 10 mg every 2 hours for systolic blood pressures are in the 165  Code Status: Full code Family Communication: Family not present Disposition Plan: per primary   Procedures:  Anterior cervical discectomy and fusion, cervical plate and screws. Procedure performed on 03/03/2015 Direct laryngoscopy with repair of hypopharyngeal laceration performed on 03/03/2015  Antibiotics:  Vancomycin (day 6)  Zosyn (day 6)  HPI/Subjective: really wants to go home Eating well  Objective: Filed Vitals:   03/11/15 0529  BP: 138/65  Pulse: 66  Temp: 97.3 F (36.3 C)  Resp: 17    Intake/Output Summary (Last 24 hours) at 03/11/15 0833 Last data filed at 03/10/15 1600  Gross per 24 hour  Intake   3290 ml  Output      0 ml  Net   3290 ml   Filed Weights   03/09/15 0500 03/10/15 0535 03/11/15 0410  Weight: 99.026 kg (218 lb 5 oz) 80.287 kg (177 lb) 98.884 kg (218 lb)    Exam:   General:  Patient appears generally well, no acute distress. Breathing comfortably on room air.  Cardiovascular: Tachycardic regular rate rhythm normal S1-S2  Respiratory: Normal respiratory effort, no wheezing  Abdomen: Soft nontender nondistended  Musculoskeletal: Neck brace in place  Data Reviewed: Basic Metabolic Panel:  Recent Labs Lab 03/06/15 0610  03/07/15 0515 03/09/15 1119 03/10/15 0650 03/11/15 0527  NA 138 137 138 140 140   K 3.3* 3.6 3.9 3.8 3.4*  CL 98* 100* 99* 108 108  CO2 30 29 27 22 26   GLUCOSE 114* 109* 106* 84 165*  BUN 12 13 14 15 9   CREATININE 0.69 0.73 1.43* 1.49* 1.30*  CALCIUM 8.8* 8.6* 9.3 8.8* 8.6*   Liver Function Tests: No results for input(s): AST, ALT, ALKPHOS, BILITOT, PROT, ALBUMIN in the last 168 hours. No results for input(s): LIPASE, AMYLASE in the last 168 hours. No results for input(s): AMMONIA in the last 168 hours. CBC:  Recent Labs Lab 03/05/15 0521 03/06/15 0610 03/07/15 0515 03/09/15 1119 03/10/15 0650 03/11/15 0527  WBC 15.1* 11.0* 8.7 11.9* 10.5 9.1  NEUTROABS 11.8*  --   --  9.4*  --   --   HGB 12.5 10.9* 10.0* 10.9* 10.7* 9.4*  HCT 35.9* 32.6* 30.0* 30.9* 31.5* 27.6*  MCV 88.2 87.6 87.5 85.6 86.1 86.0  PLT 214 192 190 211 206 197   Cardiac Enzymes: No results for input(s): CKTOTAL, CKMB, CKMBINDEX, TROPONINI in the last 168 hours. BNP (last 3 results)  Recent Labs  03/05/15 1907  BNP 63.3    ProBNP (last 3 results) No results for input(s): PROBNP in the last 8760 hours.  CBG:  Recent Labs Lab 03/10/15 0835 03/10/15 1557 03/10/15 2001 03/11/15 0010 03/11/15 0400  GLUCAP 69 133* 144* 133* 143*    Recent Results (from the past 240 hour(s))  Culture, blood (routine x 2) Call MD if unable to obtain prior to antibiotics being given     Status: None   Collection Time: 03/05/15  2:42 PM  Result Value Ref Range Status   Specimen Description BLOOD LEFT ARM  Final   Special Requests BOTTLES DRAWN AEROBIC ONLY 3CC  Final   Culture NO GROWTH 5 DAYS  Final   Report Status 03/10/2015 FINAL  Final  Culture, blood (routine x 2) Call MD if unable to obtain prior to antibiotics being given     Status: None   Collection Time: 03/05/15  2:50 PM  Result Value Ref Range Status   Specimen Description BLOOD LEFT HAND  Final   Special Requests BOTTLES DRAWN AEROBIC ONLY [place]  Final   Culture NO GROWTH 5 DAYS  Final   Report Status 03/10/2015 FINAL  Final   Culture, sputum-assessment     Status: None   Collection Time: 03/05/15  5:00 PM  Result Value Ref Range Status   Specimen Description SPUTUM  Final   Special Requests NONE  Final   Sputum evaluation   Final    MICROSCOPIC FINDINGS SUGGEST THAT THIS SPECIMEN IS NOT REPRESENTATIVE OF LOWER RESPIRATORY SECRETIONS. PLEASE RECOLLECT. CALLED TO S.ASIDI,RN AT 1925 BY L.PITT 03/05/15    Report Status 03/05/2015 FINAL  Final  Culture, expectorated sputum-assessment     Status: None   Collection Time: 03/06/15 12:30 AM  Result Value Ref Range Status   Specimen Description SPUTUM  Final   Special Requests NONE  Final   Sputum evaluation   Final    MICROSCOPIC FINDINGS SUGGEST THAT THIS SPECIMEN IS NOT REPRESENTATIVE OF LOWER RESPIRATORY SECRETIONS. PLEASE RECOLLECT. Gram Stain Report Called to,Read Back By and Verified With: [facility] @0129  03/06/15 MKELLY    Report Status 03/06/2015 FINAL  Final     Studies: US Soft Tissue Head/neck  03/10/2015  CLINICAL DATA:  History of neck swelling following ACDF. Evaluate for definable/drainable fluid collection. EXAM: ULTRASOUND  OF HEAD/NECK SOFT TISSUES TECHNIQUE: Ultrasound examination of the head and neck soft tissues was performed in the area of clinical concern. COMPARISON:  None. FINDINGS: Provided grayscale images of the patient's area of concern involving the anterior neck are negative for discrete solid or cystic mass or fluid collection. This was confirmed with real-time sonographic evaluation performed by the dictating radiologist. IMPRESSION: No definable / drainable fluid collection visualized within the anterior neck. If clinical concern persists, further evaluation could be performed with contrast-enhanced neck CT as indicated. Electronically Signed   By: Sandi Mariscal M.D.   On: 03/10/2015 10:59   Dg Swallowing Func-speech Pathology  03/10/2015  Objective Swallowing Evaluation:   Patient Details Name: BREZLYNN HOLMAN MRN: [id number] Date of  Birth: 01-26-1966 Today's Date: 03/10/2015 Time: SLP Start Time (ACUTE ONLY): 1350-SLP Stop Time (ACUTE ONLY): 1405 SLP Time Calculation (min) (ACUTE ONLY): 15 min Past Medical History: Past Medical History Diagnosis Date . Hypertension    takes Hyzaar daily . Neck pain    bone spur on esophagus . Headache(784.0)    MRI in 2007 bc of headaches . Arthritis    patient unsure . Urinary frequency  . Heart murmur    New diagnosis . Dry eyes    uses Restasis bid . Depression    but doesn't require any meds . Diffuse idiopathic skeletal hyperostosis  . PONV (postoperative nausea and vomiting)    nausea and vomiting  . Anxiety  . GERD (gastroesophageal reflux disease)  Past Surgical History: Past Surgical History Procedure Laterality Date . Abdominal hysterectomy  2011   bleeding . Anterior cervical decomp/discectomy fusion N/A 08/21/2012   Procedure: Excision of exostosis C2-3,C3-4, C4-5;  Surgeon: Jessy Oto, MD;  Location: [place];  Service: Orthopedics;  Laterality: N/A; . Anterior cervical decomp/discectomy fusion N/A 03/03/2015   Procedure: ANTERIOR CERVICAL DISCECTOMY AND FUSION WITH PARTIAL VERTEBRECTOMY C3 AND C4, CERVICAL PLATE AND SCREWS, ALLOGRAFT, LOCAL BONE GRAFT, VIVIGEN;  Surgeon: Jessy Oto, MD;  Location: [place];  Service: Orthopedics;  Laterality: N/A; . Direct laryngoscopy  03/03/2015   Procedure: DIRECT LARYNGOSCOPY AND PLACEMENT OF FEEDING TUBE;  Surgeon: [name], MD;  Location: [place];  Service: ENT;; . Repair of esophagus  03/03/2015   Procedure: REPAIR OF ESOPHAGUS;  Surgeon: [name], MD;  Location: [place];  Service: ENT;; HPI: Patient is a 59 year oldfemale. In 2014 she had removal of exostoses over the anterior aspect of the cervical spine at C2-3, 3-4 and C4-5.On 03/03/15 pt had an anterior cervical disectomy and fustion with partial vertebrectomy C3 and C4, cervical plate and screws, and repair of esophagus, there was a 2cm tear near the pyrifrom sinus. CXR on 03/06/15 showed stable  mild basilar atelectasis and small left effusion. Pt had a DG esophagus water soluable assessment indicated significant swelling, reduced epiglottic deflection causing moderate aspiration. Pt currently NPO for BSE.  No Data Recorded Assessment / Plan / Recommendation CHL IP CLINICAL IMPRESSIONS 03/10/2015 Therapy Diagnosis Moderate pharyngeal phase dysphagia Clinical Impression Pt demonstrated moderate pharyngeal dysphagia due significant swelling affecting sensation and movement of swallow function. Pt demonstrated reduced hyolaryngeal elevation, incomplete epiglottic deflection, airway protection, and pharyngeal residue across all consistencies. Pt silently aspirated thin liquids before/during the swallow, and silently aspirated nectar thick liquids and secretions after the swallow due to residue. Residue with puree and solids was also very prevalent. Swallow and residue was unimproved by attempts at chin tuck and head turns. An effortful swallow greatly improved pt's swallow function of nectar,  with multiple swallows minimal to no residue was noted. As pt fatigues, effortful swallow is not as forceful so residue increased. Pt was successful in doing an effortful swallow, coughing, and then swallowing again. SLP provided therapeutic exercise during the modified to work on this exercise. SLP recommends nectar thick liquids, pills crushed with nectar thick liquids. Pt will f/u with exercise and education to pt about thickening drinks prior to d/c.    CHL IP TREATMENT RECOMMENDATION 03/10/2015 Treatment Recommendations F/U MBS in --- days (Comment);Therapy as outlined in treatment plan below   CHL IP DIET RECOMMENDATION 03/10/2015 SLP Diet Recommendations -- Liquid Administration via -- Medication Administration -- Compensations Slow rate;Small sips/bites;Effortful swallow;Clear throat after each swallow;Multiple dry swallows after each bite/sip Postural Changes --   CHL IP OTHER RECOMMENDATIONS 03/10/2015  Recommended Consults -- Oral Care Recommendations Oral care BID Other Recommendations Order thickener from pharmacy;Have oral suction available   No flowsheet data found.  CHL IP FREQUENCY AND DURATION 03/10/2015 Speech Therapy Frequency (ACUTE ONLY) min 2x/week Treatment Duration 2 weeks      CHL IP ORAL PHASE 03/10/2015 Oral Phase WFL Oral - Pudding Teaspoon -- Oral - Pudding Cup -- Oral - Honey Teaspoon -- Oral - Honey Cup -- Oral - Nectar Teaspoon -- Oral - Nectar Cup -- Oral - Nectar Straw -- Oral - Thin Teaspoon -- Oral - Thin Cup -- Oral - Thin Straw -- Oral - Puree -- Oral - Mech Soft -- Oral - Regular -- Oral - Multi-Consistency -- Oral - Pill -- Oral Phase - Comment --  CHL IP PHARYNGEAL PHASE 03/10/2015 Pharyngeal Phase Nectar;Thin;Solids Pharyngeal- Pudding Teaspoon -- Pharyngeal -- Pharyngeal- Pudding Cup -- Pharyngeal -- Pharyngeal- Honey Teaspoon -- Pharyngeal -- Pharyngeal- Honey Cup -- Pharyngeal -- Pharyngeal- Nectar Teaspoon NT Pharyngeal -- Pharyngeal- Nectar Cup Reduced epiglottic inversion;Reduced laryngeal elevation;Reduced airway/laryngeal closure;Penetration/Aspiration during swallow;Penetration/Apiration after swallow;Trace aspiration;Pharyngeal residue - valleculae Pharyngeal Material enters airway, CONTACTS cords and not ejected out Pharyngeal- Nectar Straw NT Pharyngeal -- Pharyngeal- Thin Teaspoon NT Pharyngeal -- Pharyngeal- Thin Cup Reduced epiglottic inversion;Reduced anterior laryngeal mobility;Penetration/Aspiration before swallow;Penetration/Aspiration during swallow;Moderate aspiration;Pharyngeal residue - valleculae Pharyngeal Material enters airway, passes BELOW cords without attempt by patient to eject out (silent aspiration) Pharyngeal- Thin Straw NT Pharyngeal -- Pharyngeal- Puree Delayed swallow initiation-vallecula;Reduced epiglottic inversion;Reduced anterior laryngeal mobility;Reduced laryngeal elevation;Reduced airway/laryngeal closure;Pharyngeal residue - valleculae  Pharyngeal -- Pharyngeal- Mechanical Soft NT Pharyngeal -- Pharyngeal- Regular Reduced epiglottic inversion;Reduced anterior laryngeal mobility;Reduced laryngeal elevation;Reduced airway/laryngeal closure;Pharyngeal residue - valleculae Pharyngeal -- Pharyngeal- Multi-consistency NT Pharyngeal -- Pharyngeal- Pill Delayed swallow initiation-vallecula;Pharyngeal residue - valleculae;Penetration/Aspiration before swallow;Penetration/Aspiration during swallow;Moderate aspiration;Reduced laryngeal elevation;Reduced airway/laryngeal closure;Reduced epiglottic inversion Pharyngeal Material enters airway, passes BELOW cords without attempt by patient to eject out (silent aspiration) Pharyngeal Comment --  CHL IP CERVICAL ESOPHAGEAL PHASE 03/10/2015 Cervical Esophageal Phase WFL Pudding Teaspoon -- Pudding Cup -- Honey Teaspoon -- Honey Cup -- Nectar Teaspoon -- Nectar Cup -- Nectar Straw -- Thin Teaspoon -- Thin Cup -- Thin Straw -- Puree -- Mechanical Soft -- Regular -- Multi-consistency -- Pill -- Cervical Esophageal Comment -- Completed by Lanier Ensign, SLP Student Supervised and reviewed by Herbie Baltimore MA CCC-SLP [place], Katherene Ponto 03/10/2015, 2:47 PM               Scheduled Meds: . antiseptic oral rinse  7 mL Mouth Rinse BID  . chlorhexidine  15 mL Mouth Rinse BID  . cycloSPORINE  1 drop Both Eyes BID  . feeding supplement (ENSURE ENLIVE)  237  mL Oral BID BM  . feeding supplement (PRO-STAT SUGAR FREE 64)  30 mL Oral BID PC  . multivitamin  5 mL Oral Daily  . pantoprazole (PROTONIX) IV  40 mg Intravenous Q12H  . piperacillin-tazobactam (ZOSYN)  IV  3.375 g Intravenous 3 times per day  . sodium chloride  1,000 mL Intravenous Once  . sodium chloride  3 mL Intravenous Q12H  . triamcinolone  1 spray Each Nare BID  . vancomycin  750 mg Intravenous Q12H   Continuous Infusions: . sodium chloride    . dextrose 5 % and 0.45% NaCl 125 mL/hr at 03/10/15 2007    Active Problems:   Hypertension    HNP (herniated nucleus pulposus) with myelopathy, cervical   Spinal stenosis in cervical region   S/P cervical spinal fusion   Esophageal tear   Family history of DVT   HCAP (healthcare-associated pneumonia)   Bilateral lower extremity edema   Cough    Time spent: 15 min    [facility] Pager [phone]. If 7PM-7AM, please contact night-coverage at [email or web address], password [facility] 03/11/2015, 8:33 AM  LOS: 8 days

## 2015-03-12 LAB — BASIC METABOLIC PANEL
ANION GAP: 9 (ref 5–15)
BUN: 8 mg/dL (ref 6–20)
CALCIUM: 9 mg/dL (ref 8.9–10.3)
CO2: 25 mmol/L (ref 22–32)
Chloride: 109 mmol/L (ref 101–111)
Creatinine, Ser: 1.15 mg/dL — ABNORMAL HIGH (ref 0.44–1.00)
GFR calc Af Amer: >60 mL/min (ref >60)
GFR, EST NON AFRICAN AMERICAN: 55 mL/min — AB (ref >60)
GLUCOSE: 102 mg/dL — AB (ref 65–99)
Potassium: 3.4 mmol/L — ABNORMAL LOW (ref 3.5–5.1)
SODIUM: 143 mmol/L (ref 135–145)

## 2015-03-12 MED ORDER — RESOURCE THICKENUP CLEAR PO POWD: ORAL | Status: DC

## 2015-03-12 MED ORDER — PHENOL 1.4 % MT LIQD: 1.0000 | OROMUCOSAL | Status: DC | PRN

## 2015-03-12 MED ORDER — HYDROCODONE-ACETAMINOPHEN 7.5-325 MG/15ML PO SOLN: 15.0000 mL | ORAL | Status: DC | PRN

## 2015-03-12 MED ORDER — GUAIFENESIN 100 MG/5ML PO SOLN: 15.0000 mL | Freq: Four times a day (QID) | ORAL | Status: DC | PRN

## 2015-03-12 NOTE — Progress Notes (Signed)
Speech Language Pathology Treatment: Dysphagia  Patient Details Name: Carolyn [name] MRN: [id number] DOB: [birth year] Today's Date: 03/12/2015 Time: [id number] SLP Time Calculation (min) (ACUTE ONLY): 27 min  Assessment / Plan / Recommendation Clinical Impression  Treatment focused on education prior to d/c. Patient with many questions regarding current diet limitations/recommendations and thickening of liquids. SLP provided education regarding both. At this time, patient is independent with thickening of liquids and use of compensatory strategies which she demonstrated today with ease. Requested reassurance that these items were being completed corrently which this SLP provided. Patient verbalized concern regarding coughing episode last pm independent of po intake being indicative of aspiration. At this time, patient remains on room air with stable temps and no new CXR. OVerall, current diet appears to remain appropriate. At this time, swallowing function should only be improving with improved edema. Will continue to f/u while patient in hospital for education as needed and readiness to advance diet with repeat MBS. MBS scheduled for 11/23 as OP should patient discharge.    HPI HPI: Patient is a 68 year oldfemale. In 2014 she had removal of exostoses over the anterior aspect of the cervical spine at C2-3, 3-4 and C4-5.On 03/03/15 pt had an anterior cervical disectomy and fustion with partial vertebrectomy C3 and C4, cervical plate and screws, and repair of esophagus, there was a 2cm tear near the pyrifrom sinus. CXR on 03/06/15 showed stable mild basilar atelectasis and small left effusion. Pt had a DG esophagus water soluable assessment indicated significant swelling, reduced epiglottic deflection causing moderate aspiration. Pt currently on nectar thick liquids ONLY per MBS.      SLP Plan  Continue with current plan of care     Recommendations  Diet recommendations: Nectar-thick  liquid Liquids provided via: Cup;Teaspoon Medication Administration:  (crushed with nectar thick liquid) Supervision: Patient able to self feed;Intermittent supervision to cue for compensatory strategies Compensations: Slow rate;Small sips/bites;Effortful swallow;Clear throat after each swallow;Multiple dry swallows after each bite/sip Postural Changes and/or Swallow Maneuvers: Seated upright 90 degrees              Oral Care Recommendations: Oral care BID Plan: Continue with current plan of care  [facility], CCC-SLP [phone]  Carolyn [name] 03/12/2015, 11:17 AM

## 2015-03-12 NOTE — Progress Notes (Signed)
Awake, alert and oriented x 4.  Dressing left neck is dry, may shower and bath area of neck. Walking in the hallway with IV pole. Tolerating thicken liquid diet. Will plan to discharge home tomorrow if we can transition to  Oral antibiotics and accomplish diet adequate to maintain nourishment Will see in the AM.

## 2015-03-12 NOTE — Progress Notes (Signed)
Pt able to self feed sitting upright. Pt alert, oriented x4.  Pt educated and intermittent supervision. Followed by Speech.  No noted distress. Will continue to monitor. Call bell within reach.

## 2015-03-12 NOTE — Discharge Instructions (Addendum)
° ° °  No lifting greater than 10 lbs. No overhead use of arms. °Avoid bending,and twisting neck. °Walk in house for first week them may start to get out slowly increasing distance up to one quarter mile by 3 weeks post op. °Keep incision dry for 3 days, may then bathe and wet incision using a [place] collar when showering. °Call if any fevers >101, chills, or increasing numbness or weakness or increased swelling or drainage. ° °

## 2015-03-12 NOTE — Progress Notes (Addendum)
[facility] PROGRESS NOTE  Carolyn [name] [id number] DOB: 29-Jun-1965 DOA: 03/03/2015 PCP: Rory Percy, MD   [name] is a pleasant 49 year old female who was admitted to the orthopedic service on 03/03/2015 to undergo cervical discectomy. She has a history of C3-4 severe cervical stenosis at 6.9 mm. Physical examination had revealed lower extremity hyperreflexia. She was taken to the operating room on 03/03/2015 undergoing anterior cervical discectomy and fusion, cervical plate and screws. Intraoperatively with placement of NG tube there were concerns of esophageal tear. Dr. Lowella Petties of ENT was consulted. Dr. Lowella Petties performed performed direct laryngoscopy and repaired hypopharyngeal laceration. Postoperative course was complicated by the development of pneumonia. Less revealed upward trend in her white count to 15,100 on 03/05/2015 as chest x-ray revealed a left lower lobe pneumonia. She was started on broad-spectrum IV antimicrobial therapy with vancomycin and Zosyn.   Assessment/Plan: 1. Healthcare associated pneumonia versus aspiration pneumonia -Mrs. Polgar undergoing cervical discectomy and fusion on 03/03/2015. -Postoperatively patient having deterioration in respiratory status with elevation of white count to 15,100. She was further worked up with chest x-ray that revealed development of left lower lobe pneumonia and started on empiric IV antimicrobial therapy with vancomycin and Zosyn. -Blood cultures that were drawn on 03/05/2015 showing no growth to date. -She remains afebrile -Lab showing lactate of 1.1 on 03/09/2015. -She was started on Vanc + Zosyn on 03/05/2015 (day 7)- can d/c.   2.  Acute kidney injury -Would recommend continuing IV fluid resuscitation, follow-up on a.m. lab work.  3.  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 -Dr. Lowella Petties of ENT  consulted who performed direct laryngoscopy and repair of laceration. - Ultrasound of neck performed on 03/10/2015 did not show reveal drainable fluid collection within the anterior neck.   3.  Esophageal tear -As mentioned above this was a complication of recent cervical discectomy procedure. -ENT repairing laceration -Gastrografin swallow evaluation on 03/08/2015 did not show evidence of leakage.  -DYS diet- printed script for thickener MBS scheduled for 11/23 as OP   4.  Hypertension. -stable    Can be d/c'd from my standpoint with SLP on 11/23 and BMP in 1 week with PCP   Code Status: Full code Family Communication: Family not present Disposition Plan: per primary   Procedures:  Anterior cervical discectomy and fusion, cervical plate and screws. Procedure performed on 03/03/2015 Direct laryngoscopy with repair of hypopharyngeal laceration performed on 03/03/2015  Antibiotics:  Vancomycin (day 6)  Zosyn (day 6)  HPI/Subjective: Had coughing episode last PM  Objective: Filed Vitals:   03/12/15 1027  BP: 163/85  Pulse: 73  Temp: 97.9 F (36.6 C)  Resp: 16   No intake or output data in the 24 hours ending 03/12/15 1222 Filed Weights   03/10/15 0535 03/11/15 0410 03/12/15 0500  Weight: 80.287 kg (177 lb) 98.884 kg (218 lb) 99.338 kg (219 lb)    Exam:   General:  Patient appears generally well, no acute distress. Breathing comfortably on room air.  Cardiovascular: Tachycardic regular rate rhythm normal S1-S2  Respiratory: Normal respiratory effort- clear, no wheezing  Abdomen: Soft nontender nondistended  Musculoskeletal: Neck brace in place  Data Reviewed: Basic Metabolic Panel:  Recent Labs Lab 03/07/15 0515 03/09/15 1119 03/10/15 0650 03/11/15 0527 03/12/15 0326  NA 137 138 140 140 143  K 3.6 3.9 3.8 3.4* 3.4*  CL 100* 99* 108 108 109  CO2 29 27  22 26 25   GLUCOSE 109* 106* 84 165* 102*  BUN 13 14 15 9 8   CREATININE 0.73 1.43* 1.49*  1.30* 1.15*  CALCIUM 8.6* 9.3 8.8* 8.6* 9.0   Liver Function Tests: No results for input(s): AST, ALT, ALKPHOS, BILITOT, PROT, ALBUMIN in the last 168 hours. No results for input(s): LIPASE, AMYLASE in the last 168 hours. No results for input(s): AMMONIA in the last 168 hours. CBC:  Recent Labs Lab 03/06/15 0610 03/07/15 0515 03/09/15 1119 03/10/15 0650 03/11/15 0527  WBC 11.0* 8.7 11.9* 10.5 9.1  NEUTROABS  --   --  9.4*  --   --   HGB 10.9* 10.0* 10.9* 10.7* 9.4*  HCT 32.6* 30.0* 30.9* 31.5* 27.6*  MCV 87.6 87.5 85.6 86.1 86.0  PLT 192 190 211 206 197   Cardiac Enzymes: No results for input(s): CKTOTAL, CKMB, CKMBINDEX, TROPONINI in the last 168 hours. BNP (last 3 results)  Recent Labs  03/05/15 1907  BNP 63.3    ProBNP (last 3 results) No results for input(s): PROBNP in the last 8760 hours.  CBG:  Recent Labs Lab 03/10/15 1141 03/10/15 1557 03/10/15 2001 03/11/15 0010 03/11/15 0400  GLUCAP 101* 133* 144* 133* 143*    Recent Results (from the past 240 hour(s))  Culture, blood (routine x 2) Call MD if unable to obtain prior to antibiotics being given     Status: None   Collection Time: 03/05/15  2:42 PM  Result Value Ref Range Status   Specimen Description BLOOD LEFT ARM  Final   Special Requests BOTTLES DRAWN AEROBIC ONLY 3CC  Final   Culture NO GROWTH 5 DAYS  Final   Report Status 03/10/2015 FINAL  Final  Culture, blood (routine x 2) Call MD if unable to obtain prior to antibiotics being given     Status: None   Collection Time: 03/05/15  2:50 PM  Result Value Ref Range Status   Specimen Description BLOOD LEFT HAND  Final   Special Requests BOTTLES DRAWN AEROBIC ONLY [place]  Final   Culture NO GROWTH 5 DAYS  Final   Report Status 03/10/2015 FINAL  Final  Culture, sputum-assessment     Status: None   Collection Time: 03/05/15  5:00 PM  Result Value Ref Range Status   Specimen Description SPUTUM  Final   Special Requests NONE  Final   Sputum  evaluation   Final    MICROSCOPIC FINDINGS SUGGEST THAT THIS SPECIMEN IS NOT REPRESENTATIVE OF LOWER RESPIRATORY SECRETIONS. PLEASE RECOLLECT. CALLED TO S.ASIDI,RN AT 1925 BY L.PITT 03/05/15    Report Status 03/05/2015 FINAL  Final  Culture, expectorated sputum-assessment     Status: None   Collection Time: 03/06/15 12:30 AM  Result Value Ref Range Status   Specimen Description SPUTUM  Final   Special Requests NONE  Final   Sputum evaluation   Final    MICROSCOPIC FINDINGS SUGGEST THAT THIS SPECIMEN IS NOT REPRESENTATIVE OF LOWER RESPIRATORY SECRETIONS. PLEASE RECOLLECT. Gram Stain Report Called to,Read Back By and Verified With: [facility] @0129  03/06/15 MKELLY    Report Status 03/06/2015 FINAL  Final     Studies: Dg Swallowing Func-speech Pathology  03/10/2015  Objective Swallowing Evaluation:   Patient Details Name: AKEISHA FRANCES MRN: [id number] Date of Birth: 1966/01/10 Today's Date: 03/10/2015 Time: SLP Start Time (ACUTE ONLY): 1350-SLP Stop Time (ACUTE ONLY): 1405 SLP Time Calculation (min) (ACUTE ONLY): 15 min Past Medical History: Past Medical History Diagnosis Date . Hypertension    takes Hyzaar daily .  Neck pain    bone spur on esophagus . Headache(784.0)    MRI in 2007 bc of headaches . Arthritis    patient unsure . Urinary frequency  . Heart murmur    New diagnosis . Dry eyes    uses Restasis bid . Depression    but doesn't require any meds . Diffuse idiopathic skeletal hyperostosis  . PONV (postoperative nausea and vomiting)    nausea and vomiting  . Anxiety  . GERD (gastroesophageal reflux disease)  Past Surgical History: Past Surgical History Procedure Laterality Date . Abdominal hysterectomy  2011   bleeding . Anterior cervical decomp/discectomy fusion N/A 08/21/2012   Procedure: Excision of exostosis C2-3,C3-4, C4-5;  Surgeon: Jessy Oto, MD;  Location: [place];  Service: Orthopedics;  Laterality: N/A; . Anterior cervical decomp/discectomy fusion N/A 03/03/2015   Procedure: ANTERIOR  CERVICAL DISCECTOMY AND FUSION WITH PARTIAL VERTEBRECTOMY C3 AND C4, CERVICAL PLATE AND SCREWS, ALLOGRAFT, LOCAL BONE GRAFT, VIVIGEN;  Surgeon: Jessy Oto, MD;  Location: [place];  Service: Orthopedics;  Laterality: N/A; . Direct laryngoscopy  03/03/2015   Procedure: DIRECT LARYNGOSCOPY AND PLACEMENT OF FEEDING TUBE;  Surgeon: [name], MD;  Location: [place];  Service: ENT;; . Repair of esophagus  03/03/2015   Procedure: REPAIR OF ESOPHAGUS;  Surgeon: [name], MD;  Location: [place];  Service: ENT;; HPI: Patient is a 89 year oldfemale. In 2014 she had removal of exostoses over the anterior aspect of the cervical spine at C2-3, 3-4 and C4-5.On 03/03/15 pt had an anterior cervical disectomy and fustion with partial vertebrectomy C3 and C4, cervical plate and screws, and repair of esophagus, there was a 2cm tear near the pyrifrom sinus. CXR on 03/06/15 showed stable mild basilar atelectasis and small left effusion. Pt had a DG esophagus water soluable assessment indicated significant swelling, reduced epiglottic deflection causing moderate aspiration. Pt currently NPO for BSE.  No Data Recorded Assessment / Plan / Recommendation CHL IP CLINICAL IMPRESSIONS 03/10/2015 Therapy Diagnosis Moderate pharyngeal phase dysphagia Clinical Impression Pt demonstrated moderate pharyngeal dysphagia due significant swelling affecting sensation and movement of swallow function. Pt demonstrated reduced hyolaryngeal elevation, incomplete epiglottic deflection, airway protection, and pharyngeal residue across all consistencies. Pt silently aspirated thin liquids before/during the swallow, and silently aspirated nectar thick liquids and secretions after the swallow due to residue. Residue with puree and solids was also very prevalent. Swallow and residue was unimproved by attempts at chin tuck and head turns. An effortful swallow greatly improved pt's swallow function of nectar, with multiple swallows minimal to no residue was  noted. As pt fatigues, effortful swallow is not as forceful so residue increased. Pt was successful in doing an effortful swallow, coughing, and then swallowing again. SLP provided therapeutic exercise during the modified to work on this exercise. SLP recommends nectar thick liquids, pills crushed with nectar thick liquids. Pt will f/u with exercise and education to pt about thickening drinks prior to d/c.    CHL IP TREATMENT RECOMMENDATION 03/10/2015 Treatment Recommendations F/U MBS in --- days (Comment);Therapy as outlined in treatment plan below   CHL IP DIET RECOMMENDATION 03/10/2015 SLP Diet Recommendations -- Liquid Administration via -- Medication Administration -- Compensations Slow rate;Small sips/bites;Effortful swallow;Clear throat after each swallow;Multiple dry swallows after each bite/sip Postural Changes --   CHL IP OTHER RECOMMENDATIONS 03/10/2015 Recommended Consults -- Oral Care Recommendations Oral care BID Other Recommendations Order thickener from pharmacy;Have oral suction available   No flowsheet data found.  CHL IP FREQUENCY AND DURATION 03/10/2015 Speech Therapy  Frequency (ACUTE ONLY) min 2x/week Treatment Duration 2 weeks      CHL IP ORAL PHASE 03/10/2015 Oral Phase WFL Oral - Pudding Teaspoon -- Oral - Pudding Cup -- Oral - Honey Teaspoon -- Oral - Honey Cup -- Oral - Nectar Teaspoon -- Oral - Nectar Cup -- Oral - Nectar Straw -- Oral - Thin Teaspoon -- Oral - Thin Cup -- Oral - Thin Straw -- Oral - Puree -- Oral - Mech Soft -- Oral - Regular -- Oral - Multi-Consistency -- Oral - Pill -- Oral Phase - Comment --  CHL IP PHARYNGEAL PHASE 03/10/2015 Pharyngeal Phase Nectar;Thin;Solids Pharyngeal- Pudding Teaspoon -- Pharyngeal -- Pharyngeal- Pudding Cup -- Pharyngeal -- Pharyngeal- Honey Teaspoon -- Pharyngeal -- Pharyngeal- Honey Cup -- Pharyngeal -- Pharyngeal- Nectar Teaspoon NT Pharyngeal -- Pharyngeal- Nectar Cup Reduced epiglottic inversion;Reduced laryngeal elevation;Reduced  airway/laryngeal closure;Penetration/Aspiration during swallow;Penetration/Apiration after swallow;Trace aspiration;Pharyngeal residue - valleculae Pharyngeal Material enters airway, CONTACTS cords and not ejected out Pharyngeal- Nectar Straw NT Pharyngeal -- Pharyngeal- Thin Teaspoon NT Pharyngeal -- Pharyngeal- Thin Cup Reduced epiglottic inversion;Reduced anterior laryngeal mobility;Penetration/Aspiration before swallow;Penetration/Aspiration during swallow;Moderate aspiration;Pharyngeal residue - valleculae Pharyngeal Material enters airway, passes BELOW cords without attempt by patient to eject out (silent aspiration) Pharyngeal- Thin Straw NT Pharyngeal -- Pharyngeal- Puree Delayed swallow initiation-vallecula;Reduced epiglottic inversion;Reduced anterior laryngeal mobility;Reduced laryngeal elevation;Reduced airway/laryngeal closure;Pharyngeal residue - valleculae Pharyngeal -- Pharyngeal- Mechanical Soft NT Pharyngeal -- Pharyngeal- Regular Reduced epiglottic inversion;Reduced anterior laryngeal mobility;Reduced laryngeal elevation;Reduced airway/laryngeal closure;Pharyngeal residue - valleculae Pharyngeal -- Pharyngeal- Multi-consistency NT Pharyngeal -- Pharyngeal- Pill Delayed swallow initiation-vallecula;Pharyngeal residue - valleculae;Penetration/Aspiration before swallow;Penetration/Aspiration during swallow;Moderate aspiration;Reduced laryngeal elevation;Reduced airway/laryngeal closure;Reduced epiglottic inversion Pharyngeal Material enters airway, passes BELOW cords without attempt by patient to eject out (silent aspiration) Pharyngeal Comment --  CHL IP CERVICAL ESOPHAGEAL PHASE 03/10/2015 Cervical Esophageal Phase WFL Pudding Teaspoon -- Pudding Cup -- Honey Teaspoon -- Honey Cup -- Nectar Teaspoon -- Nectar Cup -- Nectar Straw -- Thin Teaspoon -- Thin Cup -- Thin Straw -- Puree -- Mechanical Soft -- Regular -- Multi-consistency -- Pill -- Cervical Esophageal Comment -- Completed by Lanier Ensign, SLP Student Supervised and reviewed by Herbie Baltimore MA CCC-SLP [place], Katherene Ponto 03/10/2015, 2:47 PM               Scheduled Meds: . antiseptic oral rinse  7 mL Mouth Rinse BID  . chlorhexidine  15 mL Mouth Rinse BID  . cycloSPORINE  1 drop Both Eyes BID  . feeding supplement (ENSURE ENLIVE)  237 mL Oral BID BM  . feeding supplement (PRO-STAT SUGAR FREE 64)  30 mL Oral BID PC  . multivitamin  5 mL Oral Daily  . pantoprazole (PROTONIX) IV  40 mg Intravenous Q12H  . piperacillin-tazobactam (ZOSYN)  IV  3.375 g Intravenous 3 times per day  . sodium chloride  1,000 mL Intravenous Once  . sodium chloride  3 mL Intravenous Q12H  . triamcinolone  1 spray Each Nare BID  . vancomycin  750 mg Intravenous Q12H   Continuous Infusions: . sodium chloride      Active Problems:   Hypertension   HNP (herniated nucleus pulposus) with myelopathy, cervical   Spinal stenosis in cervical region   S/P cervical spinal fusion   Esophageal tear   Family history of DVT   HCAP (healthcare-associated pneumonia)   Bilateral lower extremity edema   Cough    Time spent: 15 min    [facility] Pager [phone]. If 7PM-7AM, please contact  night-coverage at [email or web address], password [facility] 03/12/2015, 12:22 PM  LOS: 9 days

## 2015-03-13 LAB — BASIC METABOLIC PANEL
ANION GAP: 9 (ref 5–15)
BUN: 9 mg/dL (ref 6–20)
CALCIUM: 9 mg/dL (ref 8.9–10.3)
CO2: 25 mmol/L (ref 22–32)
CREATININE: 1.3 mg/dL — AB (ref 0.44–1.00)
Chloride: 106 mmol/L (ref 101–111)
GFR, EST AFRICAN AMERICAN: 55 mL/min — AB (ref >60)
GFR, EST NON AFRICAN AMERICAN: 47 mL/min — AB (ref >60)
Glucose, Bld: 111 mg/dL — ABNORMAL HIGH (ref 65–99)
Potassium: 3.3 mmol/L — ABNORMAL LOW (ref 3.5–5.1)
SODIUM: 140 mmol/L (ref 135–145)

## 2015-03-13 LAB — CBC WITH DIFFERENTIAL/PLATELET
BASOS ABS: 0 10*3/uL (ref 0.0–0.1)
BASOS PCT: 0 %
EOS ABS: 0.3 10*3/uL (ref 0.0–0.7)
EOS PCT: 3 %
HCT: 28.3 % — ABNORMAL LOW (ref 36.0–46.0)
Hemoglobin: 9.6 g/dL — ABNORMAL LOW (ref 12.0–15.0)
Lymphocytes Relative: 16 %
Lymphs Abs: 1.4 10*3/uL (ref 0.7–4.0)
MCH: 29.1 pg (ref 26.0–34.0)
MCHC: 33.9 g/dL (ref 30.0–36.0)
MCV: 85.8 fL (ref 78.0–100.0)
MONO ABS: 0.8 10*3/uL (ref 0.1–1.0)
MONOS PCT: 9 %
NEUTROS ABS: 6.6 10*3/uL (ref 1.7–7.7)
Neutrophils Relative %: 72 %
PLATELETS: 174 10*3/uL (ref 150–400)
RBC: 3.3 MIL/uL — ABNORMAL LOW (ref 3.87–5.11)
RDW: 13.4 % (ref 11.5–15.5)
WBC: 9.1 10*3/uL (ref 4.0–10.5)

## 2015-03-13 NOTE — Progress Notes (Signed)
Discharge orders received, Pt for discharge home. IV d/c'd. D/c instructions and RX given with verbalized understanding. Family at bedside to assist patient with discharge. Staff bought pt downstairs via wheelchair. 03/13/15 1133

## 2015-03-13 NOTE — Progress Notes (Addendum)
     Subjective: 10 Days Post-Op Procedure(s) (LRB): ANTERIOR CERVICAL DISCECTOMY AND FUSION WITH PARTIAL VERTEBRECTOMY C3 AND C4, CERVICAL PLATE AND SCREWS, ALLOGRAFT, LOCAL BONE GRAFT, VIVIGEN (N/A) DIRECT LARYNGOSCOPY AND PLACEMENT OF FEEDING TUBE REPAIR OF ESOPHAGUS Awake, alert and oriented x 4. Patient reports pain as mild.    Objective:   VITALS:  Temp:  [97.9 F (36.6 C)-98.8 F (37.1 C)] 98.1 F (36.7 C) (11/18 0500) Pulse Rate:  [61-86] 63 (11/18 0500) Resp:  [16-20] 20 (11/18 0500) BP: (143-165)/(82-90) 151/84 mmHg (11/18 0500) SpO2:  [96 %-98 %] 97 % (11/18 0500) Weight:  [98.204 kg (216 lb 8 oz)] 98.204 kg (216 lb 8 oz) (11/18 0500)  Neurologically intact ABD soft Neurovascular intact Sensation intact distally Intact pulses distally Dorsiflexion/Plantar flexion intact Incision: no drainage and Swelling left neck is stable and decreasing. No cellulitis present   LABS  Recent Labs  03/11/15 0527 03/13/15 0546  HGB 9.4* 9.6*  WBC 9.1 9.1  PLT 197 174    Recent Labs  03/12/15 0326 03/13/15 0546  NA 143 140  K 3.4* 3.3*  CL 109 106  CO2 25 25  BUN 8 9  CREATININE 1.15* 1.30*  GLUCOSE 102* 111*   No results for input(s): LABPT, INR in the last 72 hours.   Assessment/Plan: 10 Days Post-Op Procedure(s) (LRB): ANTERIOR CERVICAL DISCECTOMY AND FUSION WITH PARTIAL VERTEBRECTOMY C3 AND C4, CERVICAL PLATE AND SCREWS, ALLOGRAFT, LOCAL BONE GRAFT, VIVIGEN (N/A) DIRECT LARYNGOSCOPY AND PLACEMENT OF FEEDING TUBE REPAIR OF ESOPHAGUS  pnuemonia Anemia dysphagia  Up with therapy Discharge home with home health Dr. Eliseo Squires contacted and I will inform Dr. Erik Obey of plan to discharge. NITKA,JAMES E 03/13/2015, 8:37 AM

## 2015-03-18 ENCOUNTER — Ambulatory Visit (HOSPITAL_COMMUNITY)
Admit: 2015-03-18 | Discharge: 2015-03-18 | Disposition: A | Discharge status: Home / Self Care | Payer: BC Managed Care – PPO | Source: Ambulatory Visit | Attending: Specialist | Admitting: Specialist

## 2015-03-18 ENCOUNTER — Ambulatory Visit (HOSPITAL_COMMUNITY)
Admit: 2015-03-18 | Discharge: 2015-03-18 | Disposition: A | Discharge status: Home / Self Care | Payer: BC Managed Care – PPO | Attending: Specialist | Admitting: Specialist

## 2015-03-18 DIAGNOSIS — R1310 Dysphagia, unspecified: Secondary | ICD-10-CM | POA: Insufficient documentation

## 2015-03-18 DIAGNOSIS — Y838 Other surgical procedures as the cause of abnormal reaction of the patient, or of later complication, without mention of misadventure at the time of the procedure: Secondary | ICD-10-CM | POA: Insufficient documentation

## 2015-03-18 DIAGNOSIS — K9172 Accidental puncture and laceration of a digestive system organ or structure during other procedure: Secondary | ICD-10-CM | POA: Insufficient documentation

## 2015-04-02 NOTE — Discharge Summary (Signed)
Patient ID: Carolyn [name] MRN: [id number] DOB/AGE: May 26, 1965 49 y.o.  Admit date: 03/03/2015 Discharge date: 03/13/15 Admission Diagnoses:  Active Problems:   Hypertension   HNP (herniated nucleus pulposus) with myelopathy, cervical   Spinal stenosis in cervical region   S/P cervical spinal fusion   Esophageal tear   Family history of DVT   HCAP (healthcare-associated pneumonia)   Bilateral lower extremity edema   Cough   Dysphagia, pharyngoesophageal phase   Discharge Diagnoses:  Active Problems:   Hypertension   HNP (herniated nucleus pulposus) with myelopathy, cervical   Spinal stenosis in cervical region   S/P cervical spinal fusion   Esophageal tear   Family history of DVT   HCAP (healthcare-associated pneumonia)   Bilateral lower extremity edema   Cough   Dysphagia, pharyngoesophageal phase  status post Procedure(s): ANTERIOR CERVICAL DISCECTOMY AND FUSION WITH PARTIAL VERTEBRECTOMY C3 AND C4, CERVICAL PLATE AND SCREWS, ALLOGRAFT, LOCAL BONE GRAFT, VIVIGEN DIRECT LARYNGOSCOPY AND PLACEMENT OF FEEDING TUBE REPAIR OF ESOPHAGUS  Past Medical History  Diagnosis Date  . Hypertension     takes Hyzaar daily  . Neck pain     bone spur on esophagus  . Headache(784.0)     MRI in 2007 bc of headaches  . Arthritis     patient unsure  . Urinary frequency   . Heart murmur     New diagnosis  . Dry eyes     uses Restasis bid  . Depression     but doesn't require any meds  . Diffuse idiopathic skeletal hyperostosis   . PONV (postoperative nausea and vomiting)     nausea and vomiting   . Anxiety   . GERD (gastroesophageal reflux disease)     Surgeries: Procedure(s): ANTERIOR CERVICAL DISCECTOMY AND FUSION WITH PARTIAL VERTEBRECTOMY C3 AND C4, CERVICAL PLATE AND SCREWS, ALLOGRAFT, LOCAL BONE GRAFT, VIVIGEN DIRECT LARYNGOSCOPY AND PLACEMENT OF FEEDING TUBE REPAIR OF ESOPHAGUS on 03/03/2015   Consultants: Treatment Team:  [name], MD Kelvin Cellar,  MD  Discharged Condition: Improved  Hospital Course: Carolyn [name] is an 49 y.o. female who was admitted 03/03/2015 for operative treatment of cervical stenosis/hnp. Patient failed conservative treatments (please see the history and physical for the specifics) and had severe unremitting pain that affects sleep, daily activities and work/hobbies. After pre-op clearance, the patient was taken to the operating room on 03/03/2015 and underwent  Procedure(s): ANTERIOR CERVICAL DISCECTOMY AND FUSION WITH PARTIAL VERTEBRECTOMY C3 AND C4, CERVICAL PLATE AND SCREWS, ALLOGRAFT, LOCAL BONE GRAFT, VIVIGEN DIRECT LARYNGOSCOPY AND PLACEMENT OF FEEDING TUBE REPAIR OF ESOPHAGUS.    Patient was given perioperative antibiotics:  Anti-infectives    Start     Dose/Rate Route Frequency Ordered Stop   03/10/15 1600  vancomycin (VANCOCIN) IVPB 750 mg/150 ml premix  Status:  Discontinued     750 mg 150 mL/hr over 60 Minutes Intravenous Every 12 hours 03/10/15 1426 03/13/15 0727   03/05/15 1400  piperacillin-tazobactam (ZOSYN) IVPB 3.375 g  Status:  Discontinued     3.375 g 100 mL/hr over 30 Minutes Intravenous 3 times per day 03/05/15 1158 03/05/15 1234   03/05/15 1400  piperacillin-tazobactam (ZOSYN) IVPB 3.375 g  Status:  Discontinued     3.375 g 12.5 mL/hr over 240 Minutes Intravenous 3 times per day 03/05/15 1234 03/13/15 0727   03/05/15 1400  vancomycin (VANCOCIN) IVPB 1000 mg/200 mL premix  Status:  Discontinued     1,000 mg 200 mL/hr over 60 Minutes Intravenous Every 8 hours  03/05/15 1248 03/09/15 2327   03/03/15 2000  ceFAZolin (ANCEF) IVPB 1 g/50 mL premix  Status:  Discontinued     1 g 100 mL/hr over 30 Minutes Intravenous Every 8 hours 03/03/15 1635 03/05/15 1158   03/03/15 1252  polymyxin B 500,000 Units, bacitracin 50,000 Units in sodium chloride irrigation 0.9 % 500 mL irrigation  Status:  Discontinued       As needed 03/03/15 1253 03/03/15 1320   03/03/15 0601  ceFAZolin (ANCEF) IVPB 2 g/50 mL  premix     2 g 100 mL/hr over 30 Minutes Intravenous On call to O.R. 03/03/15 0601 03/03/15 1213   03/03/15 0527  ceFAZolin (ANCEF) 2-3 GM-% IVPB SOLR    Comments:  Sharmaine Base   : cabinet override      03/03/15 0527 03/03/15 1744    Patient underwent the aforementioned procedure with anterior discectomy and fusion at the C3-4 level. During the procedure she is noted to have perforation of the esophagus on the left side piriformis sinus. A intraoperative consultation with Dr. Erik Obey and she underwent a primary repair. A Penrose drain was placed for passive drainage of the anterior cervical fusion site. Postoperatively she was placed into a Aspen-type, hard collar due to the nature of the diffuse idiopathic spinal hyperostosis changes in her cervical spine likely increase in stress at the level of her fusion at C3-4. Dr. Delia Chimes recommended for IV antibiotic treatment for at least area and of 5 days along with the NPO status for at least 5 days. Patient recovered in the PACU without difficulty was transferred to [facility] 5 C. There she continued her postoperative course she demonstrated the problems with initial of cough and then productive cough over the ensuing to 2 days postoperatively and to x-ray was obtained and the demonstrated concern of the left lower lobe pneumonia. A [facility] hospitalist consult was obtained and patient was placed on IV antibiotic treatment for hospital-acquired pneumonia. A Panda like of feeding tube was placed intraoperatively and after a period of about 24 hours she was begun on gradual feeding tube continuous feeding. She however demonstrated difficulties with periods of cough and oropharyngeal irritation secondary to perioperative swelling of the esophagus and retropharyngeal region. IV fluids were continued our these were decreased as tube feedings were begun. Antibiotics for the treatment of pneumonia were vancomycin and Zosyn. The Ancef she was on for  the esophageal perforation was discontinued. Over the course of 4-5 days postop the Penrose drain the left neck was actually advanced and eventually discontinued on postop day #4. On postoperative day #5 a barium swallow study was obtained which demonstrated some swelling in the retropharyngeal area with concern for aspiration with liquids. Patient was evaluated by speech therapy and showed initial difficulty with swallowing and penetration and with thin liquids so that the thickening of liquids was undertaken. Patient showed gradual resolution of feelings of fullness and swelling of the cervical spine and a postoperative ultrasound was obtained of the cervical spine for consideration of aspiration of the a.m. Esophageal fluid collection is present. Results of the ultrasound indicated that there is no sign of fluid collection that could be aspirated. Patient was gradually progressed to taking by mouth nutrition and observed by speech therapist to improve in her overall point performance. Her antibiotics were gradually discontinued on postoperative day number  Patient was given sequential compression devices and early ambulation to prevent DVT.   Patient benefited maximally from hospital stay and there were no complications.  At the time of discharge, the patient was urinating/moving their bowels without difficulty, tolerating a regular diet, pain is controlled with oral pain medications and they have been cleared by PT/OT.   Recent vital signs: No data found.    Recent laboratory studies: No results for input(s): WBC, HGB, HCT, PLT, NA, K, CL, CO2, BUN, CREATININE, GLUCOSE, INR, CALCIUM in the last 72 hours.  Invalid input(s): PT, 2   Discharge Medications:     Medication List    STOP taking these medications        losartan-hydrochlorothiazide 100-25 MG tablet  Commonly known as:  HYZAAR      TAKE these medications        acetaminophen 500 MG tablet  Commonly known as:  TYLENOL  Take  1,000 mg by mouth every 8 (eight) hours as needed for mild pain or moderate pain.     cycloSPORINE 0.05 % ophthalmic emulsion  Commonly known as:  RESTASIS  Place 1 drop into both eyes 2 (two) times daily.     esomeprazole 20 MG capsule  Commonly known as:  NEXIUM  Take 20 mg by mouth daily as needed.     guaiFENesin 100 MG/5ML Soln  Commonly known as:  ROBITUSSIN  Take 15 mLs (300 mg total) by mouth every 6 (six) hours as needed for cough or to loosen phlegm.     HYDROcodone-acetaminophen 7.5-325 mg/15 ml solution  Commonly known as:  HYCET  Take 15 mLs by mouth every 3 (three) hours as needed for moderate pain.     phenol 1.4 % Liqd  Commonly known as:  CHLORASEPTIC  Use as directed 1 spray in the mouth or throat as needed for throat irritation / pain.     Tynan Powd  Use to thicken fluids to nectar thick        Diagnostic Studies: Dg Chest 2 View  03/05/2015  CLINICAL DATA:  Cough and chest congestion, previous history of esophageal tear EXAM: CHEST  2 VIEW COMPARISON:  PA and lateral chest x-ray of August 20, 2012 FINDINGS: The lungs are adequately inflated. The interstitial markings are mildly increased bilaterally. There is infiltrate in the left lower lobe posterior medially. There is a trace of pleural fluid blunting the left costophrenic angles. The cardiac silhouette is top-normal in size. The pulmonary vascularity is not engorged. The mediastinum is normal in width. A feeding tube is present with the tip lying in the region of the pylorus. The bony thorax exhibits no acute abnormality. IMPRESSION: Left lower lobe pneumonia and trace left pleural effusion. Mild pulmonary interstitial prominence bilaterally may reflect low-grade interstitial edema or less likely interstitial pneumonia. Electronically Signed   By: David  Martinique M.D.   On: 03/05/2015 08:42   Dg Cervical Spine 2 Or 3 Views  03/03/2015  CLINICAL DATA:  C3-4 fusion. EXAM: CERVICAL SPINE - 2-3  VIEW COMPARISON:  MRI cervical spine dated 02/04/2015. FINDINGS: Two cross-table lateral views, intraoperative, show anterior cervical fusion hardware placement at the C3-C4 levels with intervening disc spacer. IMPRESSION: Anterior cervical fusion hardware placement at C3-4. Electronically Signed   By: Franki Cabot M.D.   On: 03/03/2015 10:55   US Soft Tissue Head/neck  03/10/2015  CLINICAL DATA:  History of neck swelling following ACDF. Evaluate for definable/drainable fluid collection. EXAM: ULTRASOUND OF HEAD/NECK SOFT TISSUES TECHNIQUE: Ultrasound examination of the head and neck soft tissues was performed in the area of clinical concern. COMPARISON:  None. FINDINGS: Provided grayscale images of the  patient's area of concern involving the anterior neck are negative for discrete solid or cystic mass or fluid collection. This was confirmed with real-time sonographic evaluation performed by the dictating radiologist. IMPRESSION: No definable / drainable fluid collection visualized within the anterior neck. If clinical concern persists, further evaluation could be performed with contrast-enhanced neck CT as indicated. Electronically Signed   By: Sandi Mariscal M.D.   On: 03/10/2015 10:59   Dg Chest Port 1 View  03/06/2015  CLINICAL DATA:  Cough and congestion.  History of esophageal tear EXAM: PORTABLE CHEST 1 VIEW COMPARISON:  05/04/2014 FINDINGS: Stable cardiomegaly and upper mediastinal contours. Mild atelectatic changes at the bases, subsegmental. Small left pleural effusion is stable. Surgical drain in the left neck again noted. No soft tissue gas or pneumothorax. Feeding tube at least reaches the stomach. IMPRESSION: Stable mild basilar atelectasis and small left effusion. Electronically Signed   By: Monte Fantasia M.D.   On: 03/06/2015 06:20   Dg Abd Portable 1v  03/03/2015  CLINICAL DATA:  Feeding catheter placement EXAM: PORTABLE ABDOMEN - 1 VIEW COMPARISON:  None. FINDINGS: Scattered large and  small bowel gas is noted. A feeding catheter is noted within the distal stomach directed towards the pyloric channel. No free air is seen. No bony abnormality is noted. IMPRESSION: Feeding catheter within the stomach. Electronically Signed   By: Inez Catalina M.D.   On: 03/03/2015 13:13   Dg Swallowing Func-speech Pathology  03/18/2015  Objective Swallowing Evaluation:   Patient Details Name: JAMEA LAT MRN: [id number] Date of Birth: [birth year] Today's Date: 03/18/2015 Time: SLP Start Time (ACUTE ONLY): 1140-SLP Stop Time (ACUTE ONLY): 1210 SLP Time Calculation (min) (ACUTE ONLY): 30 min Past Medical History: Past Medical History Diagnosis Date . Hypertension    takes Hyzaar daily . Neck pain    bone spur on esophagus . Headache(784.0)    MRI in 2007 bc of headaches . Arthritis    patient unsure . Urinary frequency  . Heart murmur    New diagnosis . Dry eyes    uses Restasis bid . Depression    but doesn't require any meds . Diffuse idiopathic skeletal hyperostosis  . PONV (postoperative nausea and vomiting)    nausea and vomiting  . Anxiety  . GERD (gastroesophageal reflux disease)  Past Surgical History: Past Surgical History Procedure Laterality Date . Abdominal hysterectomy  2011   bleeding . Anterior cervical decomp/discectomy fusion N/A 08/21/2012   Procedure: Excision of exostosis C2-3,C3-4, C4-5;  Surgeon: Jessy Oto, MD;  Location: [place];  Service: Orthopedics;  Laterality: N/A; . Anterior cervical decomp/discectomy fusion N/A 03/03/2015   Procedure: ANTERIOR CERVICAL DISCECTOMY AND FUSION WITH PARTIAL VERTEBRECTOMY C3 AND C4, CERVICAL PLATE AND SCREWS, ALLOGRAFT, LOCAL BONE GRAFT, VIVIGEN;  Surgeon: Jessy Oto, MD;  Location: [place];  Service: Orthopedics;  Laterality: N/A; . Direct laryngoscopy  03/03/2015   Procedure: DIRECT LARYNGOSCOPY AND PLACEMENT OF FEEDING TUBE;  Surgeon: [name], MD;  Location: [place];  Service: ENT;; . Repair of esophagus  03/03/2015   Procedure: REPAIR OF ESOPHAGUS;   Surgeon: [name], MD;  Location: [place];  Service: ENT;; HPI: 75 year oldfemale referred for OPMBS. In 2014, she had removal of exostoses over the anterior aspect of the cervical spine at C2-3, C3-4 and C4-5.On 03/03/15, pt had an anterior cervical disectomy and fusion with partial vertebrectomy C3 and C4, cervical plate and screws, and repair of esophagus. There was a 2cm tear near the pyriform sinus. Gastrografin swallow  evaluation 11/13 showed resolved tear. CXR on 03/06/15 showed stable mild basilar atelectasis and small left effusion. MBS 03/10/15 revealed mod pharyngeal phase dysphagia secondary to edema.  There was reduced hyolaryngeal elevation, incomplete epiglottic deflection, and pharyngeal residue across all consistencies. Notable was silent aspiration before and during the swallow.  Pt D/Cd 11/18 on liquid diet, thickened to nectar.  Subjective: pleasant; notes subjective improvement Assessment / Plan / Recommendation CHL IP CLINICAL IMPRESSIONS 03/18/2015 Therapy Diagnosis Mild pharyngeal phase dysphagia Clinical Impression Pt demonstrates significant improvement in swallow function given reduction in edema in posterior pharynx, allowing improved mobility of hyolaryngeal complex and effective epiglottic deflection over larynx.  There remains edema, but pt is able to sufficiently overcome its presence.  There is mild residue in the hypopharynx (across consistencies) which clears with subsequent swallow.  There is transient, high penetration of thins, but no materials reached level of the vocal cords and there was no aspiration.  Pt may begin to liberalize her diet, eating moist solids and using caution/deliberation when eating.  She agrees with results/recs.  No SLP f/u is warranted.    CHL IP TREATMENT RECOMMENDATION 03/18/2015 Treatment Recommendations No treatment recommended at this time   CHL IP DIET RECOMMENDATION 03/18/2015 SLP Diet Recommendations Regular solids;Thin liquid Liquid  Administration via Cup;Straw Medication Administration Crushed with puree Compensations Slow rate;Small sips/bites Postural Changes Seated upright at 90 degrees   CHL IP OTHER RECOMMENDATIONS 03/18/2015 Recommended Consults -- Oral Care Recommendations Oral care BID Other Recommendations --   CHL IP FOLLOW UP RECOMMENDATIONS 03/18/2015 Follow up Recommendations None   CHL IP FREQUENCY AND DURATION 03/10/2015 Speech Therapy Frequency (ACUTE ONLY) min 2x/week Treatment Duration 2 weeks      CHL IP ORAL PHASE 03/18/2015 Oral Phase WFL Oral - Pudding Teaspoon -- Oral - Pudding Cup -- Oral - Honey Teaspoon -- Oral - Honey Cup -- Oral - Nectar Teaspoon -- Oral - Nectar Cup -- Oral - Nectar Straw -- Oral - Thin Teaspoon -- Oral - Thin Cup -- Oral - Thin Straw -- Oral - Puree -- Oral - Mech Soft -- Oral - Regular -- Oral - Multi-Consistency -- Oral - Pill -- Oral Phase - Comment --  CHL IP PHARYNGEAL PHASE 03/18/2015 Pharyngeal Phase Thin;Nectar;Solids Pharyngeal- Pudding Teaspoon -- Pharyngeal -- Pharyngeal- Pudding Cup -- Pharyngeal -- Pharyngeal- Honey Teaspoon -- Pharyngeal -- Pharyngeal- Honey Cup -- Pharyngeal -- Pharyngeal- Nectar Teaspoon Reduced epiglottic inversion;Reduced anterior laryngeal mobility;Pharyngeal residue - valleculae;Pharyngeal residue - pyriform Pharyngeal Material does not enter airway Pharyngeal- Nectar Cup Reduced epiglottic inversion;Reduced anterior laryngeal mobility;Pharyngeal residue - valleculae;Pharyngeal residue - pyriform Pharyngeal Material does not enter airway Pharyngeal- Nectar Straw NT Pharyngeal -- Pharyngeal- Thin Teaspoon Reduced epiglottic inversion;Reduced anterior laryngeal mobility;Pharyngeal residue - valleculae;Pharyngeal residue - pyriform Pharyngeal -- Pharyngeal- Thin Cup Reduced epiglottic inversion;Reduced anterior laryngeal mobility;Pharyngeal residue - valleculae;Pharyngeal residue - pyriform Pharyngeal Material enters airway, remains ABOVE vocal cords then ejected  out Pharyngeal- Thin Straw NT Pharyngeal -- Pharyngeal- Puree Reduced epiglottic inversion;Reduced anterior laryngeal mobility;Pharyngeal residue - valleculae;Pharyngeal residue - pyriform Pharyngeal -- Pharyngeal- Mechanical Soft Reduced epiglottic inversion;Reduced anterior laryngeal mobility;Pharyngeal residue - valleculae;Pharyngeal residue - pyriform Pharyngeal -- Pharyngeal- Regular NT Pharyngeal -- Pharyngeal- Multi-consistency Reduced epiglottic inversion;Reduced anterior laryngeal mobility;Pharyngeal residue - valleculae;Pharyngeal residue - pyriform Pharyngeal -- Pharyngeal- Pill NT Pharyngeal -- Pharyngeal Comment --  CHL IP CERVICAL ESOPHAGEAL PHASE 03/10/2015 Cervical Esophageal Phase WFL Pudding Teaspoon -- Pudding Cup -- Honey Teaspoon -- Honey Cup -- Nectar Teaspoon -- Nectar Cup -- [employer]  Straw -- Thin Teaspoon -- Thin Cup -- Thin Straw -- Puree -- Mechanical Soft -- Regular -- Multi-consistency -- Pill -- Cervical Esophageal Comment -- Amanda L. Tivis Ringer, [place] CCC/SLP Pager [phone] Juan Quam Laurice 03/18/2015, 1:09 PM              Dg Swallowing Func-speech Pathology  03/10/2015  Objective Swallowing Evaluation:   Patient Details Name: KARYN WEHRER MRN: [id number] Date of Birth: [birth year] Today's Date: 03/10/2015 Time: SLP Start Time (ACUTE ONLY): 1350-SLP Stop Time (ACUTE ONLY): 1405 SLP Time Calculation (min) (ACUTE ONLY): 15 min Past Medical History: Past Medical History Diagnosis Date . Hypertension    takes Hyzaar daily . Neck pain    bone spur on esophagus . Headache(784.0)    MRI in 2007 bc of headaches . Arthritis    patient unsure . Urinary frequency  . Heart murmur    New diagnosis . Dry eyes    uses Restasis bid . Depression    but doesn't require any meds . Diffuse idiopathic skeletal hyperostosis  . PONV (postoperative nausea and vomiting)    nausea and vomiting  . Anxiety  . GERD (gastroesophageal reflux disease)  Past Surgical History: Past Surgical History Procedure  Laterality Date . Abdominal hysterectomy  2011   bleeding . Anterior cervical decomp/discectomy fusion N/A 08/21/2012   Procedure: Excision of exostosis C2-3,C3-4, C4-5;  Surgeon: Jessy Oto, MD;  Location: [place];  Service: Orthopedics;  Laterality: N/A; . Anterior cervical decomp/discectomy fusion N/A 03/03/2015   Procedure: ANTERIOR CERVICAL DISCECTOMY AND FUSION WITH PARTIAL VERTEBRECTOMY C3 AND C4, CERVICAL PLATE AND SCREWS, ALLOGRAFT, LOCAL BONE GRAFT, VIVIGEN;  Surgeon: Jessy Oto, MD;  Location: [place];  Service: Orthopedics;  Laterality: N/A; . Direct laryngoscopy  03/03/2015   Procedure: DIRECT LARYNGOSCOPY AND PLACEMENT OF FEEDING TUBE;  Surgeon: [name], MD;  Location: [place];  Service: ENT;; . Repair of esophagus  03/03/2015   Procedure: REPAIR OF ESOPHAGUS;  Surgeon: [name], MD;  Location: [place];  Service: ENT;; HPI: Patient is a 82 year oldfemale. In 2014 she had removal of exostoses over the anterior aspect of the cervical spine at C2-3, 3-4 and C4-5.On 03/03/15 pt had an anterior cervical disectomy and fustion with partial vertebrectomy C3 and C4, cervical plate and screws, and repair of esophagus, there was a 2cm tear near the pyrifrom sinus. CXR on 03/06/15 showed stable mild basilar atelectasis and small left effusion. Pt had a DG esophagus water soluable assessment indicated significant swelling, reduced epiglottic deflection causing moderate aspiration. Pt currently NPO for BSE.  No Data Recorded Assessment / Plan / Recommendation CHL IP CLINICAL IMPRESSIONS 03/10/2015 Therapy Diagnosis Moderate pharyngeal phase dysphagia Clinical Impression Pt demonstrated moderate pharyngeal dysphagia due significant swelling affecting sensation and movement of swallow function. Pt demonstrated reduced hyolaryngeal elevation, incomplete epiglottic deflection, airway protection, and pharyngeal residue across all consistencies. Pt silently aspirated thin liquids before/during the swallow, and  silently aspirated nectar thick liquids and secretions after the swallow due to residue. Residue with puree and solids was also very prevalent. Swallow and residue was unimproved by attempts at chin tuck and head turns. An effortful swallow greatly improved pt's swallow function of nectar, with multiple swallows minimal to no residue was noted. As pt fatigues, effortful swallow is not as forceful so residue increased. Pt was successful in doing an effortful swallow, coughing, and then swallowing again. SLP provided therapeutic exercise during the modified to work on this exercise. SLP recommends nectar thick liquids, pills crushed  with nectar thick liquids. Pt will f/u with exercise and education to pt about thickening drinks prior to d/c.    CHL IP TREATMENT RECOMMENDATION 03/10/2015 Treatment Recommendations F/U MBS in --- days (Comment);Therapy as outlined in treatment plan below   CHL IP DIET RECOMMENDATION 03/10/2015 SLP Diet Recommendations -- Liquid Administration via -- Medication Administration -- Compensations Slow rate;Small sips/bites;Effortful swallow;Clear throat after each swallow;Multiple dry swallows after each bite/sip Postural Changes --   CHL IP OTHER RECOMMENDATIONS 03/10/2015 Recommended Consults -- Oral Care Recommendations Oral care BID Other Recommendations Order thickener from pharmacy;Have oral suction available   No flowsheet data found.  CHL IP FREQUENCY AND DURATION 03/10/2015 Speech Therapy Frequency (ACUTE ONLY) min 2x/week Treatment Duration 2 weeks      CHL IP ORAL PHASE 03/10/2015 Oral Phase WFL Oral - Pudding Teaspoon -- Oral - Pudding Cup -- Oral - Honey Teaspoon -- Oral - Honey Cup -- Oral - Nectar Teaspoon -- Oral - Nectar Cup -- Oral - Nectar Straw -- Oral - Thin Teaspoon -- Oral - Thin Cup -- Oral - Thin Straw -- Oral - Puree -- Oral - Mech Soft -- Oral - Regular -- Oral - Multi-Consistency -- Oral - Pill -- Oral Phase - Comment --  CHL IP PHARYNGEAL PHASE 03/10/2015  Pharyngeal Phase Nectar;Thin;Solids Pharyngeal- Pudding Teaspoon -- Pharyngeal -- Pharyngeal- Pudding Cup -- Pharyngeal -- Pharyngeal- Honey Teaspoon -- Pharyngeal -- Pharyngeal- Honey Cup -- Pharyngeal -- Pharyngeal- Nectar Teaspoon NT Pharyngeal -- Pharyngeal- Nectar Cup Reduced epiglottic inversion;Reduced laryngeal elevation;Reduced airway/laryngeal closure;Penetration/Aspiration during swallow;Penetration/Apiration after swallow;Trace aspiration;Pharyngeal residue - valleculae Pharyngeal Material enters airway, CONTACTS cords and not ejected out Pharyngeal- Nectar Straw NT Pharyngeal -- Pharyngeal- Thin Teaspoon NT Pharyngeal -- Pharyngeal- Thin Cup Reduced epiglottic inversion;Reduced anterior laryngeal mobility;Penetration/Aspiration before swallow;Penetration/Aspiration during swallow;Moderate aspiration;Pharyngeal residue - valleculae Pharyngeal Material enters airway, passes BELOW cords without attempt by patient to eject out (silent aspiration) Pharyngeal- Thin Straw NT Pharyngeal -- Pharyngeal- Puree Delayed swallow initiation-vallecula;Reduced epiglottic inversion;Reduced anterior laryngeal mobility;Reduced laryngeal elevation;Reduced airway/laryngeal closure;Pharyngeal residue - valleculae Pharyngeal -- Pharyngeal- Mechanical Soft NT Pharyngeal -- Pharyngeal- Regular Reduced epiglottic inversion;Reduced anterior laryngeal mobility;Reduced laryngeal elevation;Reduced airway/laryngeal closure;Pharyngeal residue - valleculae Pharyngeal -- Pharyngeal- Multi-consistency NT Pharyngeal -- Pharyngeal- Pill Delayed swallow initiation-vallecula;Pharyngeal residue - valleculae;Penetration/Aspiration before swallow;Penetration/Aspiration during swallow;Moderate aspiration;Reduced laryngeal elevation;Reduced airway/laryngeal closure;Reduced epiglottic inversion Pharyngeal Material enters airway, passes BELOW cords without attempt by patient to eject out (silent aspiration) Pharyngeal Comment --  [facility] IP CERVICAL  ESOPHAGEAL PHASE 03/10/2015 Cervical Esophageal Phase WFL Pudding Teaspoon -- Pudding Cup -- Honey Teaspoon -- Honey Cup -- Nectar Teaspoon -- Nectar Cup -- Nectar Straw -- Thin Teaspoon -- Thin Cup -- Thin Straw -- Puree -- Mechanical Soft -- Regular -- Multi-consistency -- Pill -- Cervical Esophageal Comment -- Completed by Lanier Ensign, SLP Student Supervised and reviewed by Herbie Baltimore MA CCC-SLP [place], Katherene Ponto 03/10/2015, 2:47 PM              Dg Esophagus W/water Sol Cm  03/08/2015  CLINICAL DATA:  49 year old female with esophageal/pharyngeal injury and repair. Evaluate for leak. EXAM: ESOPHOGRAM/BARIUM SWALLOW TECHNIQUE: Single contrast examination was performed using water-soluble Omnipaque. FLUOROSCOPY TIME:  Fluoroscopy Time:  1 minute 0 seconds. Number of Acquired Images:  166 COMPARISON:  None FINDINGS: Water-soluble Omnipaque was administered. Only a limited amount of contrast was ingested due to aspiration/coughing. Narrowing and mild irregularity of the very proximal aspect of the esophagus/distal pharynx noted. No definite leak/perforation noted on this examination although limited secondary  to small amounts of contrast tolerated. There is a moderate amount of aspiration identified. IMPRESSION: Narrowing and mild irregularity of the very proximal esophagus/ distal pharynx which may be related to postoperative changes. No definite leak/ perforation identified on this examination, but limited examination secondary to small amounts of contrast tolerated. Moderate aspiration. Electronically Signed   By: Margarette Canada M.D.   On: 03/08/2015 12:49        Discharge Instructions    Call MD / Call 911    Complete by:  As directed   If you experience chest pain or shortness of breath, CALL 911 and be transported to the hospital emergency room.  If you develope a fever above 101 F, pus (white drainage) or increased drainage or redness at the wound, or calf pain, call your surgeon's  office.     Discharge instructions    Complete by:  As directed   No lifting greater than 10 lbs. No overhead use of arms. Avoid bending,and twisting neck. Walk in house for first week them may start to get out slowly increasing distance up to one mile by 3 weeks post op. May bathe and wet incision using a [place] collar when showering. Call if any fevers >101, chills, or increasing numbness or weakness or increased swelling or drainage.     Driving restrictions    Complete by:  As directed   No driving for 3 weeks     Increase activity slowly as tolerated    Complete by:  As directed      Lifting restrictions    Complete by:  As directed   No lifting for 8 weeks           Follow-up Information    Follow up with NITKA,JAMES E, MD In 5 days.   Specialty:  Orthopedic Surgery   Contact information:   [place] [address] [phone]       Follow up with [name], MD In 4 weeks.   Specialty:  Otolaryngology   Why:  For wound re-check   Contact information:   [address] [place] [address] [phone]       Follow up with [facility] On 03/18/2015.      Discharge Plan:  discharge to home  Disposition:     Signed: Lanae Crumbly  04/02/2015, 3:31 AM

## 2015-04-03 NOTE — Discharge Summary (Signed)
Patient ID: Carolyn [name] MRN: [id number] DOB/AGE: 1965-09-09 49 y.o.  Admit date: 03/03/2015 Discharge date: 03/13/15 Admission Diagnoses:  Active Problems:   HNP (herniated nucleus pulposus) with myelopathy, cervical   Spinal stenosis in cervical region   Esophageal tear   Dysphagia, pharyngoesophageal phase   Hypertension   S/P cervical spinal fusion   Family history of DVT   HCAP (healthcare-associated pneumonia)   Bilateral lower extremity edema   Cough   Discharge Diagnoses:  Active Problems:   HNP (herniated nucleus pulposus) with myelopathy, cervical   Spinal stenosis in cervical region   Esophageal tear   Dysphagia, pharyngoesophageal phase   Hypertension   S/P cervical spinal fusion   Family history of DVT   HCAP (healthcare-associated pneumonia)   Bilateral lower extremity edema   Cough  status post Procedure(s): ANTERIOR CERVICAL DISCECTOMY AND FUSION WITH PARTIAL VERTEBRECTOMY C3 AND C4, CERVICAL PLATE AND SCREWS, ALLOGRAFT, LOCAL BONE GRAFT, VIVIGEN DIRECT LARYNGOSCOPY AND PLACEMENT OF FEEDING TUBE REPAIR OF ESOPHAGUS  Past Medical History  Diagnosis Date  . Hypertension     takes Hyzaar daily  . Neck pain     bone spur on esophagus  . Headache(784.0)     MRI in 2007 bc of headaches  . Arthritis     patient unsure  . Urinary frequency   . Heart murmur     New diagnosis  . Dry eyes     uses Restasis bid  . Depression     but doesn't require any meds  . Diffuse idiopathic skeletal hyperostosis   . PONV (postoperative nausea and vomiting)     nausea and vomiting   . Anxiety   . GERD (gastroesophageal reflux disease)     Surgeries: Procedure(s): ANTERIOR CERVICAL DISCECTOMY AND FUSION WITH PARTIAL VERTEBRECTOMY C3 AND C4, CERVICAL PLATE AND SCREWS, ALLOGRAFT, LOCAL BONE GRAFT, VIVIGEN DIRECT LARYNGOSCOPY AND PLACEMENT OF FEEDING TUBE REPAIR OF ESOPHAGUS on 03/03/2015   Consultants: Treatment Team:  [name], MD Kelvin Cellar,  MD  Discharged Condition: Improved  Hospital Course: Carolyn [name] is an 49 y.o. female who was admitted 03/03/2015 for operative treatment of cervical stenosis/hnp. Patient failed conservative treatments (please see the history and physical for the specifics) and had severe unremitting pain that affects sleep, daily activities and work/hobbies. After pre-op clearance, the patient was taken to the operating room on 03/03/2015 and underwent  Procedure(s): ANTERIOR CERVICAL DISCECTOMY AND FUSION WITH PARTIAL VERTEBRECTOMY C3 AND C4, CERVICAL PLATE AND SCREWS, ALLOGRAFT, LOCAL BONE GRAFT, VIVIGEN DIRECT LARYNGOSCOPY AND PLACEMENT OF FEEDING TUBE REPAIR OF ESOPHAGUS.    Patient was given perioperative antibiotics:      Anti-infectives    Start     Dose/Rate Route Frequency Ordered Stop   03/10/15 1600  vancomycin (VANCOCIN) IVPB 750 mg/150 ml premix  Status:  Discontinued     750 mg 150 mL/hr over 60 Minutes Intravenous Every 12 hours 03/10/15 1426 03/13/15 0727   03/05/15 1400  piperacillin-tazobactam (ZOSYN) IVPB 3.375 g  Status:  Discontinued     3.375 g 100 mL/hr over 30 Minutes Intravenous 3 times per day 03/05/15 1158 03/05/15 1234   03/05/15 1400  piperacillin-tazobactam (ZOSYN) IVPB 3.375 g  Status:  Discontinued     3.375 g 12.5 mL/hr over 240 Minutes Intravenous 3 times per day 03/05/15 1234 03/13/15 0727   03/05/15 1400  vancomycin (VANCOCIN) IVPB 1000 mg/200 mL premix  Status:  Discontinued     1,000 mg 200 mL/hr over 60 Minutes  Intravenous Every 8 hours 03/05/15 1248 03/09/15 2327   03/03/15 2000  ceFAZolin (ANCEF) IVPB 1 g/50 mL premix  Status:  Discontinued     1 g 100 mL/hr over 30 Minutes Intravenous Every 8 hours 03/03/15 1635 03/05/15 1158   03/03/15 1252  polymyxin B 500,000 Units, bacitracin 50,000 Units in sodium chloride irrigation 0.9 % 500 mL irrigation  Status:  Discontinued       As needed 03/03/15 1253 03/03/15 1320   03/03/15 0601  ceFAZolin (ANCEF) IVPB 2  g/50 mL premix     2 g 100 mL/hr over 30 Minutes Intravenous On call to O.R. 03/03/15 0601 03/03/15 1213   03/03/15 0527  ceFAZolin (ANCEF) 2-3 GM-% IVPB SOLR    Comments:  Sharmaine Base   : cabinet override      03/03/15 0527 03/03/15 1744    Patient underwent the aforementioned procedure with anterior discectomy and fusion at the C3-4 level. During the procedure she is noted to have perforation of the esophagus on the left side piriformis sinus. A intraoperative consultation with Dr. Erik Obey and she underwent a primary repair. A Penrose drain was placed for passive drainage of the anterior cervical fusion site. Postoperatively she was placed into a Aspen-type, hard collar due to the nature of the diffuse idiopathic spinal hyperostosis changes in her cervical spine likely increase in stress at the level of her fusion at C3-4. Dr. Delia Chimes recommended for IV antibiotic treatment for at least area and of 5 days along with the NPO status for at least 5 days. Patient recovered in the PACU without difficulty was transferred to [facility]. There she continued her postoperative course she demonstrated the problems with initial of cough and then productive cough over the ensuing to 2 days postoperatively and to x-ray was obtained and the demonstrated concern of the left lower lobe pneumonia. A [facility] hospitalist consult was obtained and patient was placed on IV antibiotic treatment for hospital-acquired pneumonia. A Panda like of feeding tube was placed intraoperatively and after a period of about 24 hours she was begun on gradual feeding tube continuous feeding. She however demonstrated difficulties with periods of cough and oropharyngeal irritation secondary to perioperative swelling of the esophagus and retropharyngeal region. IV fluids were continued our these were decreased as tube feedings were begun. Antibiotics for the treatment of pneumonia were vancomycin and Zosyn. The Ancef she  was on for the esophageal perforation was discontinued. Over the course of 4-5 days postop the Penrose drain the left neck was actually advanced and eventually discontinued on postop day #4. On postoperative day #5 a barium swallow study was obtained which demonstrated some swelling in the retropharyngeal area with concern for aspiration with liquids. Patient was evaluated by speech therapy and showed initial difficulty with swallowing and penetration and with thin liquids so that the thickening of liquids was undertaken. Patient showed gradual resolution of feelings of fullness and swelling of the cervical spine and a postoperative ultrasound was obtained of the cervical spine for consideration of aspiration of the a.m. Esophageal fluid collection is present. Results of the ultrasound indicated that there is no sign of fluid collection that could be aspirated. Patient was gradually progressed to taking by mouth nutrition and observed by speech therapist to improve in her overall point performance. Her antibiotics were gradually discontinued on postoperative day number 9. She demonstrated the ability to stand ambulate in late 2 and from the bathroom in the hallway. She was able to  tolerate by mouth nourishment and by mouth medications that were in a elixir could not fluids performed. She was discharged home on 03/13/2015 Continued follow-up at home with occupational therapy and physical therapy. She had removed the NG tube on postoperative day #3 and a can NG tube was not replaced as shunt was felt to be young and vigorous and likely could withstand nothing by mouth status for a period of 3 or 4 days without substantial problems with malnourishment. The time of her discharge incision left neck was healing without erythremia or drainage. Swelling of the left neck and anterior neck and markedly diminished. Patient's laboratory tests demonstrated a white cell count having returned to normal and patient's vital signs  were stable. She is scheduled to undergo repeat of various swallow study and a period of 2 weeks postop and return appointment with Dr. Delia Chimes EENT the 3-4 weeks postop.  Patient was given sequential compression devices and early ambulation to prevent DVT.   Patient benefited maximally from hospital stay and there were no complications. At the time of discharge, the patient was urinating/moving their bowels without difficulty, tolerating a regular diet, pain is controlled with oral pain medications and they have been cleared by PT/OT.   Recent vital signs: No data found.    Recent laboratory studies: No results for input(s): WBC, HGB, HCT, PLT, NA, K, CL, CO2, BUN, CREATININE, GLUCOSE, INR, CALCIUM in the last 72 hours.  Invalid input(s): PT, 2   Discharge Medications:     Medication List    STOP taking these medications        losartan-hydrochlorothiazide 100-25 MG tablet  Commonly known as:  HYZAAR      TAKE these medications        acetaminophen 500 MG tablet  Commonly known as:  TYLENOL  Take 1,000 mg by mouth every 8 (eight) hours as needed for mild pain or moderate pain.     cycloSPORINE 0.05 % ophthalmic emulsion  Commonly known as:  RESTASIS  Place 1 drop into both eyes 2 (two) times daily.     esomeprazole 20 MG capsule  Commonly known as:  NEXIUM  Take 20 mg by mouth daily as needed.     guaiFENesin 100 MG/5ML Soln  Commonly known as:  ROBITUSSIN  Take 15 mLs (300 mg total) by mouth every 6 (six) hours as needed for cough or to loosen phlegm.     HYDROcodone-acetaminophen 7.5-325 mg/15 ml solution  Commonly known as:  HYCET  Take 15 mLs by mouth every 3 (three) hours as needed for moderate pain.     phenol 1.4 % Liqd  Commonly known as:  CHLORASEPTIC  Use as directed 1 spray in the mouth or throat as needed for throat irritation / pain.     Vigo Powd  Use to thicken fluids to nectar thick        Diagnostic Studies: Dg Chest 2  View  03/05/2015  CLINICAL DATA:  Cough and chest congestion, previous history of esophageal tear EXAM: CHEST  2 VIEW COMPARISON:  PA and lateral chest x-ray of August 20, 2012 FINDINGS: The lungs are adequately inflated. The interstitial markings are mildly increased bilaterally. There is infiltrate in the left lower lobe posterior medially. There is a trace of pleural fluid blunting the left costophrenic angles. The cardiac silhouette is top-normal in size. The pulmonary vascularity is not engorged. The mediastinum is normal in width. A feeding tube is present with the tip lying in the  region of the pylorus. The bony thorax exhibits no acute abnormality. IMPRESSION: Left lower lobe pneumonia and trace left pleural effusion. Mild pulmonary interstitial prominence bilaterally may reflect low-grade interstitial edema or less likely interstitial pneumonia. Electronically Signed   By: David  Martinique M.D.   On: 03/05/2015 08:42   US Soft Tissue Head/neck  03/10/2015  CLINICAL DATA:  History of neck swelling following ACDF. Evaluate for definable/drainable fluid collection. EXAM: ULTRASOUND OF HEAD/NECK SOFT TISSUES TECHNIQUE: Ultrasound examination of the head and neck soft tissues was performed in the area of clinical concern. COMPARISON:  None. FINDINGS: Provided grayscale images of the patient's area of concern involving the anterior neck are negative for discrete solid or cystic mass or fluid collection. This was confirmed with real-time sonographic evaluation performed by the dictating radiologist. IMPRESSION: No definable / drainable fluid collection visualized within the anterior neck. If clinical concern persists, further evaluation could be performed with contrast-enhanced neck CT as indicated. Electronically Signed   By: Sandi Mariscal M.D.   On: 03/10/2015 10:59   Dg Chest Port 1 View  03/06/2015  CLINICAL DATA:  Cough and congestion.  History of esophageal tear EXAM: PORTABLE CHEST 1 VIEW COMPARISON:   05/04/2014 FINDINGS: Stable cardiomegaly and upper mediastinal contours. Mild atelectatic changes at the bases, subsegmental. Small left pleural effusion is stable. Surgical drain in the left neck again noted. No soft tissue gas or pneumothorax. Feeding tube at least reaches the stomach. IMPRESSION: Stable mild basilar atelectasis and small left effusion. Electronically Signed   By: Monte Fantasia M.D.   On: 03/06/2015 06:20   Dg Swallowing Func-speech Pathology  03/18/2015  Objective Swallowing Evaluation:   Patient Details Name: Carolyn [name] MRN: [id number] Date of Birth: 1965-09-27 Today's Date: 03/18/2015 Time: SLP Start Time (ACUTE ONLY): 1140-SLP Stop Time (ACUTE ONLY): 1210 SLP Time Calculation (min) (ACUTE ONLY): 30 min Past Medical History: Past Medical History Diagnosis Date . Hypertension    takes Hyzaar daily . Neck pain    bone spur on esophagus . Headache(784.0)    MRI in 2007 bc of headaches . Arthritis    patient unsure . Urinary frequency  . Heart murmur    New diagnosis . Dry eyes    uses Restasis bid . Depression    but doesn't require any meds . Diffuse idiopathic skeletal hyperostosis  . PONV (postoperative nausea and vomiting)    nausea and vomiting  . Anxiety  . GERD (gastroesophageal reflux disease)  Past Surgical History: Past Surgical History Procedure Laterality Date . Abdominal hysterectomy  2011   bleeding . Anterior cervical decomp/discectomy fusion N/A 08/21/2012   Procedure: Excision of exostosis C2-3,C3-4, C4-5;  Surgeon: Jessy Oto, MD;  Location: [place];  Service: Orthopedics;  Laterality: N/A; . Anterior cervical decomp/discectomy fusion N/A 03/03/2015   Procedure: ANTERIOR CERVICAL DISCECTOMY AND FUSION WITH PARTIAL VERTEBRECTOMY C3 AND C4, CERVICAL PLATE AND SCREWS, ALLOGRAFT, LOCAL BONE GRAFT, VIVIGEN;  Surgeon: Jessy Oto, MD;  Location: [place];  Service: Orthopedics;  Laterality: N/A; . Direct laryngoscopy  03/03/2015   Procedure: DIRECT LARYNGOSCOPY AND PLACEMENT OF  FEEDING TUBE;  Surgeon: [name], MD;  Location: [place];  Service: ENT;; . Repair of esophagus  03/03/2015   Procedure: REPAIR OF ESOPHAGUS;  Surgeon: [name], MD;  Location: [place];  Service: ENT;; HPI: 72 year oldfemale referred for OPMBS. In 2014, she had removal of exostoses over the anterior aspect of the cervical spine at C2-3, C3-4 and C4-5.On 03/03/15, pt had an anterior  cervical disectomy and fusion with partial vertebrectomy C3 and C4, cervical plate and screws, and repair of esophagus. There was a 2cm tear near the pyriform sinus. Gastrografin swallow evaluation 11/13 showed resolved tear. CXR on 03/06/15 showed stable mild basilar atelectasis and small left effusion. MBS 03/10/15 revealed mod pharyngeal phase dysphagia secondary to edema.  There was reduced hyolaryngeal elevation, incomplete epiglottic deflection, and pharyngeal residue across all consistencies. Notable was silent aspiration before and during the swallow.  Pt D/Cd 11/18 on liquid diet, thickened to nectar.  Subjective: pleasant; notes subjective improvement Assessment / Plan / Recommendation CHL IP CLINICAL IMPRESSIONS 03/18/2015 Therapy Diagnosis Mild pharyngeal phase dysphagia Clinical Impression Pt demonstrates significant improvement in swallow function given reduction in edema in posterior pharynx, allowing improved mobility of hyolaryngeal complex and effective epiglottic deflection over larynx.  There remains edema, but pt is able to sufficiently overcome its presence.  There is mild residue in the hypopharynx (across consistencies) which clears with subsequent swallow.  There is transient, high penetration of thins, but no materials reached level of the vocal cords and there was no aspiration.  Pt may begin to liberalize her diet, eating moist solids and using caution/deliberation when eating.  She agrees with results/recs.  No SLP f/u is warranted.    CHL IP TREATMENT RECOMMENDATION 03/18/2015 Treatment Recommendations  No treatment recommended at this time   CHL IP DIET RECOMMENDATION 03/18/2015 SLP Diet Recommendations Regular solids;Thin liquid Liquid Administration via Cup;Straw Medication Administration Crushed with puree Compensations Slow rate;Small sips/bites Postural Changes Seated upright at 90 degrees   CHL IP OTHER RECOMMENDATIONS 03/18/2015 Recommended Consults -- Oral Care Recommendations Oral care BID Other Recommendations --   CHL IP FOLLOW UP RECOMMENDATIONS 03/18/2015 Follow up Recommendations None   CHL IP FREQUENCY AND DURATION 03/10/2015 Speech Therapy Frequency (ACUTE ONLY) min 2x/week Treatment Duration 2 weeks      CHL IP ORAL PHASE 03/18/2015 Oral Phase WFL Oral - Pudding Teaspoon -- Oral - Pudding Cup -- Oral - Honey Teaspoon -- Oral - Honey Cup -- Oral - Nectar Teaspoon -- Oral - Nectar Cup -- Oral - Nectar Straw -- Oral - Thin Teaspoon -- Oral - Thin Cup -- Oral - Thin Straw -- Oral - Puree -- Oral - Mech Soft -- Oral - Regular -- Oral - Multi-Consistency -- Oral - Pill -- Oral Phase - Comment --  CHL IP PHARYNGEAL PHASE 03/18/2015 Pharyngeal Phase Thin;Nectar;Solids Pharyngeal- Pudding Teaspoon -- Pharyngeal -- Pharyngeal- Pudding Cup -- Pharyngeal -- Pharyngeal- Honey Teaspoon -- Pharyngeal -- Pharyngeal- Honey Cup -- Pharyngeal -- Pharyngeal- Nectar Teaspoon Reduced epiglottic inversion;Reduced anterior laryngeal mobility;Pharyngeal residue - valleculae;Pharyngeal residue - pyriform Pharyngeal Material does not enter airway Pharyngeal- Nectar Cup Reduced epiglottic inversion;Reduced anterior laryngeal mobility;Pharyngeal residue - valleculae;Pharyngeal residue - pyriform Pharyngeal Material does not enter airway Pharyngeal- Nectar Straw NT Pharyngeal -- Pharyngeal- Thin Teaspoon Reduced epiglottic inversion;Reduced anterior laryngeal mobility;Pharyngeal residue - valleculae;Pharyngeal residue - pyriform Pharyngeal -- Pharyngeal- Thin Cup Reduced epiglottic inversion;Reduced anterior laryngeal  mobility;Pharyngeal residue - valleculae;Pharyngeal residue - pyriform Pharyngeal Material enters airway, remains ABOVE vocal cords then ejected out Pharyngeal- Thin Straw NT Pharyngeal -- Pharyngeal- Puree Reduced epiglottic inversion;Reduced anterior laryngeal mobility;Pharyngeal residue - valleculae;Pharyngeal residue - pyriform Pharyngeal -- Pharyngeal- Mechanical Soft Reduced epiglottic inversion;Reduced anterior laryngeal mobility;Pharyngeal residue - valleculae;Pharyngeal residue - pyriform Pharyngeal -- Pharyngeal- Regular NT Pharyngeal -- Pharyngeal- Multi-consistency Reduced epiglottic inversion;Reduced anterior laryngeal mobility;Pharyngeal residue - valleculae;Pharyngeal residue - pyriform Pharyngeal -- Pharyngeal- Pill NT Pharyngeal -- Pharyngeal Comment --  CHL IP CERVICAL ESOPHAGEAL PHASE 03/10/2015 Cervical Esophageal Phase WFL Pudding Teaspoon -- Pudding Cup -- Honey Teaspoon -- Honey Cup -- Nectar Teaspoon -- Nectar Cup -- Nectar Straw -- Thin Teaspoon -- Thin Cup -- Thin Straw -- Puree -- Mechanical Soft -- Regular -- Multi-consistency -- Pill -- Cervical Esophageal Comment -- Amanda L. Tivis Ringer, [place] CCC/SLP Pager [phone] Juan Quam Laurice 03/18/2015, 1:09 PM              Dg Swallowing Func-speech Pathology  03/10/2015  Objective Swallowing Evaluation:   Patient Details Name: Carolyn [name] MRN: [id number] Date of Birth: 1966/03/07 Today's Date: 03/10/2015 Time: SLP Start Time (ACUTE ONLY): 1350-SLP Stop Time (ACUTE ONLY): 1405 SLP Time Calculation (min) (ACUTE ONLY): 15 min Past Medical History: Past Medical History Diagnosis Date . Hypertension    takes Hyzaar daily . Neck pain    bone spur on esophagus . Headache(784.0)    MRI in 2007 bc of headaches . Arthritis    patient unsure . Urinary frequency  . Heart murmur    New diagnosis . Dry eyes    uses Restasis bid . Depression    but doesn't require any meds . Diffuse idiopathic skeletal hyperostosis  . PONV (postoperative nausea and  vomiting)    nausea and vomiting  . Anxiety  . GERD (gastroesophageal reflux disease)  Past Surgical History: Past Surgical History Procedure Laterality Date . Abdominal hysterectomy  2011   bleeding . Anterior cervical decomp/discectomy fusion N/A 08/21/2012   Procedure: Excision of exostosis C2-3,C3-4, C4-5;  Surgeon: Jessy Oto, MD;  Location: [place];  Service: Orthopedics;  Laterality: N/A; . Anterior cervical decomp/discectomy fusion N/A 03/03/2015   Procedure: ANTERIOR CERVICAL DISCECTOMY AND FUSION WITH PARTIAL VERTEBRECTOMY C3 AND C4, CERVICAL PLATE AND SCREWS, ALLOGRAFT, LOCAL BONE GRAFT, VIVIGEN;  Surgeon: Jessy Oto, MD;  Location: [place];  Service: Orthopedics;  Laterality: N/A; . Direct laryngoscopy  03/03/2015   Procedure: DIRECT LARYNGOSCOPY AND PLACEMENT OF FEEDING TUBE;  Surgeon: [name], MD;  Location: [place];  Service: ENT;; . Repair of esophagus  03/03/2015   Procedure: REPAIR OF ESOPHAGUS;  Surgeon: [name], MD;  Location: [place];  Service: ENT;; HPI: Patient is a 45 year oldfemale. In 2014 she had removal of exostoses over the anterior aspect of the cervical spine at C2-3, 3-4 and C4-5.On 03/03/15 pt had an anterior cervical disectomy and fustion with partial vertebrectomy C3 and C4, cervical plate and screws, and repair of esophagus, there was a 2cm tear near the pyrifrom sinus. CXR on 03/06/15 showed stable mild basilar atelectasis and small left effusion. Pt had a DG esophagus water soluable assessment indicated significant swelling, reduced epiglottic deflection causing moderate aspiration. Pt currently NPO for BSE.  No Data Recorded Assessment / Plan / Recommendation CHL IP CLINICAL IMPRESSIONS 03/10/2015 Therapy Diagnosis Moderate pharyngeal phase dysphagia Clinical Impression Pt demonstrated moderate pharyngeal dysphagia due significant swelling affecting sensation and movement of swallow function. Pt demonstrated reduced hyolaryngeal elevation, incomplete epiglottic  deflection, airway protection, and pharyngeal residue across all consistencies. Pt silently aspirated thin liquids before/during the swallow, and silently aspirated nectar thick liquids and secretions after the swallow due to residue. Residue with puree and solids was also very prevalent. Swallow and residue was unimproved by attempts at chin tuck and head turns. An effortful swallow greatly improved pt's swallow function of nectar, with multiple swallows minimal to no residue was noted. As pt fatigues, effortful swallow is not as forceful so residue increased. Pt was successful  in doing an effortful swallow, coughing, and then swallowing again. SLP provided therapeutic exercise during the modified to work on this exercise. SLP recommends nectar thick liquids, pills crushed with nectar thick liquids. Pt will f/u with exercise and education to pt about thickening drinks prior to d/c.    CHL IP TREATMENT RECOMMENDATION 03/10/2015 Treatment Recommendations F/U MBS in --- days (Comment);Therapy as outlined in treatment plan below   CHL IP DIET RECOMMENDATION 03/10/2015 SLP Diet Recommendations -- Liquid Administration via -- Medication Administration -- Compensations Slow rate;Small sips/bites;Effortful swallow;Clear throat after each swallow;Multiple dry swallows after each bite/sip Postural Changes --   CHL IP OTHER RECOMMENDATIONS 03/10/2015 Recommended Consults -- Oral Care Recommendations Oral care BID Other Recommendations Order thickener from pharmacy;Have oral suction available   No flowsheet data found.  CHL IP FREQUENCY AND DURATION 03/10/2015 Speech Therapy Frequency (ACUTE ONLY) min 2x/week Treatment Duration 2 weeks      CHL IP ORAL PHASE 03/10/2015 Oral Phase WFL Oral - Pudding Teaspoon -- Oral - Pudding Cup -- Oral - Honey Teaspoon -- Oral - Honey Cup -- Oral - Nectar Teaspoon -- Oral - Nectar Cup -- Oral - Nectar Straw -- Oral - Thin Teaspoon -- Oral - Thin Cup -- Oral - Thin Straw -- Oral - Puree --  Oral - Mech Soft -- Oral - Regular -- Oral - Multi-Consistency -- Oral - Pill -- Oral Phase - Comment --  CHL IP PHARYNGEAL PHASE 03/10/2015 Pharyngeal Phase Nectar;Thin;Solids Pharyngeal- Pudding Teaspoon -- Pharyngeal -- Pharyngeal- Pudding Cup -- Pharyngeal -- Pharyngeal- Honey Teaspoon -- Pharyngeal -- Pharyngeal- Honey Cup -- Pharyngeal -- Pharyngeal- Nectar Teaspoon NT Pharyngeal -- Pharyngeal- Nectar Cup Reduced epiglottic inversion;Reduced laryngeal elevation;Reduced airway/laryngeal closure;Penetration/Aspiration during swallow;Penetration/Apiration after swallow;Trace aspiration;Pharyngeal residue - valleculae Pharyngeal Material enters airway, CONTACTS cords and not ejected out Pharyngeal- Nectar Straw NT Pharyngeal -- Pharyngeal- Thin Teaspoon NT Pharyngeal -- Pharyngeal- Thin Cup Reduced epiglottic inversion;Reduced anterior laryngeal mobility;Penetration/Aspiration before swallow;Penetration/Aspiration during swallow;Moderate aspiration;Pharyngeal residue - valleculae Pharyngeal Material enters airway, passes BELOW cords without attempt by patient to eject out (silent aspiration) Pharyngeal- Thin Straw NT Pharyngeal -- Pharyngeal- Puree Delayed swallow initiation-vallecula;Reduced epiglottic inversion;Reduced anterior laryngeal mobility;Reduced laryngeal elevation;Reduced airway/laryngeal closure;Pharyngeal residue - valleculae Pharyngeal -- Pharyngeal- Mechanical Soft NT Pharyngeal -- Pharyngeal- Regular Reduced epiglottic inversion;Reduced anterior laryngeal mobility;Reduced laryngeal elevation;Reduced airway/laryngeal closure;Pharyngeal residue - valleculae Pharyngeal -- Pharyngeal- Multi-consistency NT Pharyngeal -- Pharyngeal- Pill Delayed swallow initiation-vallecula;Pharyngeal residue - valleculae;Penetration/Aspiration before swallow;Penetration/Aspiration during swallow;Moderate aspiration;Reduced laryngeal elevation;Reduced airway/laryngeal closure;Reduced epiglottic inversion Pharyngeal  Material enters airway, passes BELOW cords without attempt by patient to eject out (silent aspiration) Pharyngeal Comment --  CHL IP CERVICAL ESOPHAGEAL PHASE 03/10/2015 Cervical Esophageal Phase WFL Pudding Teaspoon -- Pudding Cup -- Honey Teaspoon -- Honey Cup -- Nectar Teaspoon -- Nectar Cup -- Nectar Straw -- Thin Teaspoon -- Thin Cup -- Thin Straw -- Puree -- Mechanical Soft -- Regular -- Multi-consistency -- Pill -- Cervical Esophageal Comment -- Completed by Lanier Ensign, SLP Student Supervised and reviewed by Herbie Baltimore MA CCC-SLP [place], Katherene Ponto 03/10/2015, 2:47 PM              Dg Esophagus W/water Sol Cm  03/08/2015  CLINICAL DATA:  49 year old female with esophageal/pharyngeal injury and repair. Evaluate for leak. EXAM: ESOPHOGRAM/BARIUM SWALLOW TECHNIQUE: Single contrast examination was performed using water-soluble Omnipaque. FLUOROSCOPY TIME:  Fluoroscopy Time:  1 minute 0 seconds. Number of Acquired Images:  166 COMPARISON:  None FINDINGS: Water-soluble Omnipaque was administered. Only a limited amount of contrast  was ingested due to aspiration/coughing. Narrowing and mild irregularity of the very proximal aspect of the esophagus/distal pharynx noted. No definite leak/perforation noted on this examination although limited secondary to small amounts of contrast tolerated. There is a moderate amount of aspiration identified. IMPRESSION: Narrowing and mild irregularity of the very proximal esophagus/ distal pharynx which may be related to postoperative changes. No definite leak/ perforation identified on this examination, but limited examination secondary to small amounts of contrast tolerated. Moderate aspiration. Electronically Signed   By: Margarette Canada M.D.   On: 03/08/2015 12:49    Discharge Instructions    Call MD / Call 911    Complete by:  As directed   If you experience chest pain or shortness of breath, CALL 911 and be transported to the hospital emergency room.  If you  develope a fever above 101 F, pus (white drainage) or increased drainage or redness at the wound, or calf pain, call your surgeon's office.     Discharge instructions    Complete by:  As directed   No lifting greater than 10 lbs. No overhead use of arms. Avoid bending,and twisting neck. Walk in house for first week them may start to get out slowly increasing distance up to one mile by 3 weeks post op. May bathe and wet incision using a [place] collar when showering. Call if any fevers >101, chills, or increasing numbness or weakness or increased swelling or drainage.     Driving restrictions    Complete by:  As directed   No driving for 3 weeks     Increase activity slowly as tolerated    Complete by:  As directed      Lifting restrictions    Complete by:  As directed   No lifting for 8 weeks           Follow-up Information    Follow up with Valrie Jia E, MD In 5 days.   Specialty:  Orthopedic Surgery   Contact information:   [place] [address] [phone]       Follow up with [name], MD In 4 weeks.   Specialty:  Otolaryngology   Why:  For wound re-check   Contact information:   [address] [place] [address] [phone]       Follow up with [facility] On 03/18/2015.      Discharge Plan:  discharge to home  Disposition:     Signed: Reinhardt Licausi E  04/03/2015, 3:36 PM

## 2015-04-07 ENCOUNTER — Encounter (HOSPITAL_COMMUNITY): Payer: Self-pay | Admitting: Specialist

## 2016-01-28 ENCOUNTER — Ambulatory Visit (INDEPENDENT_AMBULATORY_CARE_PROVIDER_SITE_OTHER): Payer: BC Managed Care – PPO | Admitting: Specialist

## 2016-01-28 DIAGNOSIS — M4802 Spinal stenosis, cervical region: Secondary | ICD-10-CM | POA: Diagnosis not present

## 2016-01-28 DIAGNOSIS — M545 Low back pain: Secondary | ICD-10-CM | POA: Diagnosis not present

## 2016-02-08 ENCOUNTER — Other Ambulatory Visit: Payer: Self-pay | Admitting: *Deleted

## 2016-02-08 DIAGNOSIS — I83893 Varicose veins of bilateral lower extremities with other complications: Secondary | ICD-10-CM

## 2016-02-09 ENCOUNTER — Ambulatory Visit (HOSPITAL_COMMUNITY)
Admission: RE | Admit: 2016-02-09 | Discharge: 2016-02-09 | Disposition: A | Discharge status: Home / Self Care | Payer: BC Managed Care – PPO | Source: Ambulatory Visit | Attending: Vascular Surgery | Admitting: Vascular Surgery

## 2016-02-09 DIAGNOSIS — I83893 Varicose veins of bilateral lower extremities with other complications: Secondary | ICD-10-CM | POA: Insufficient documentation

## 2016-02-16 ENCOUNTER — Ambulatory Visit: Payer: BC Managed Care – PPO | Admitting: Vascular Surgery

## 2016-02-26 ENCOUNTER — Encounter: Payer: Self-pay | Admitting: Vascular Surgery

## 2016-03-01 ENCOUNTER — Encounter: Payer: Self-pay | Admitting: Vascular Surgery

## 2016-03-01 ENCOUNTER — Ambulatory Visit (INDEPENDENT_AMBULATORY_CARE_PROVIDER_SITE_OTHER): Payer: BC Managed Care – PPO | Admitting: Vascular Surgery

## 2016-03-01 VITALS — BP 142/87 | HR 72 | Temp 97.6°F | Resp 16 | Ht 67.0 in | Wt 214.5 lb

## 2016-03-01 DIAGNOSIS — M79606 Pain in leg, unspecified: Secondary | ICD-10-CM | POA: Diagnosis not present

## 2016-03-01 NOTE — Progress Notes (Signed)
[facility] [place]  Patient name: Carolyn [name] MRN: [id number] DOB: Aug 23, 1965 Sex: female  REASON FOR VISIT: Discussion of bilateral lower extremity discomfort  HPI: Carolyn [name] is a 50 y.o. female here today for discussion of bilateral lower extremity discomfort. She has multiple components of this. She did see Dr. Bridgett Larsson in our office 2 years ago. At that time she was prescribed thigh-high 20-30 mm graduated compression garments. She has been extremely compliant with this and ports that she initially had very dramatic relief from her discomfort in wearing the compression. She reports that she has aching sensation in both legs. She reports that this is more related to dependency and that if she is lying on her left side she has pain in her left lateral leg and more on the right side if she is lying on her right side. She does have some swelling in her feet and ankles with prolonged standing. She has no history of DVT and has no varicosities. She does have cervical disc disease and has undergone surgery and is been recommended to have additional surgery which she was not willing to proceed with. Did have some complications with esophageal injury on one of her surgical treatments. Certainly some of her symptoms could be related to neurologic difficulties.  Past Medical History:  Diagnosis Date  . Anxiety   . Arthritis    patient unsure  . Depression    but doesn't require any meds  . Diffuse idiopathic skeletal hyperostosis   . Dry eyes    uses Restasis bid  . GERD (gastroesophageal reflux disease)   . Headache(784.0)    MRI in 2007 bc of headaches  . Heart murmur    New diagnosis  . Hypertension    takes Hyzaar daily  . Neck pain    bone spur on esophagus  . PONV (postoperative nausea and vomiting)    nausea and vomiting   . Urinary frequency     Family History  Problem Relation Age of Onset  . Heart murmur  Father   . Hypertension Father   . Hypertension Mother   . Cancer Paternal Aunt     uterine  . Cancer Maternal Grandmother     lung  . Cancer Maternal Grandfather     stomach  . Cancer Paternal Grandmother     lung  . Hypertension      SOCIAL HISTORY: Social History  Substance Use Topics  . Smoking status: Never Smoker  . Smokeless tobacco: Never Used  . Alcohol use Yes     Comment: rarely    No Known Allergies  Current Outpatient Prescriptions  Medication Sig Dispense Refill  . cycloSPORINE (RESTASIS) 0.05 % ophthalmic emulsion Place 1 drop into both eyes 2 (two) times daily.    [name] esomeprazole (NEXIUM) 20 MG capsule Take 20 mg by mouth daily as needed.     [name] HYDROcodone-acetaminophen (HYCET) 7.5-325 mg/15 ml solution Take 15 mLs by mouth every 3 (three) hours as needed for moderate pain. 236 mL 0  . LOSARTAN POTASSIUM-HCTZ PO Take by mouth daily.    . meloxicam (MOBIC) 15 MG tablet Take 15 mg by mouth as needed for pain.    [name] acetaminophen (TYLENOL) 500 MG tablet Take 1,000 mg by mouth every 8 (eight) hours as needed for mild pain or moderate pain.    [name] guaiFENesin (ROBITUSSIN) 100 MG/5ML SOLN Take 15 mLs (300 mg total) by mouth every 6 (six) hours as needed  for cough or to loosen phlegm. (Patient not taking: Reported on 03/01/2016) 236 mL 2  . Maltodextrin-Xanthan Gum (RESOURCE THICKENUP CLEAR) POWD Use to thicken fluids to nectar thick (Patient not taking: Reported on 03/01/2016) 3 Can 0  . phenol (CHLORASEPTIC) 1.4 % LIQD Use as directed 1 spray in the mouth or throat as needed for throat irritation / pain. (Patient not taking: Reported on 03/01/2016) 118 mL 2   No current facility-administered medications for this visit.     REVIEW OF SYSTEMS:  [X]  denotes positive finding, [ ]  denotes negative finding Cardiac  Comments:  Chest pain or chest pressure:    Shortness of breath upon exertion:    Short of breath when lying flat:    Irregular heart rhythm:        Vascular     Pain in calf, thigh, or hip brought on by ambulation:    Pain in feet at night that wakes you up from your sleep:     Blood clot in your veins:    Leg swelling:  x         PHYSICAL EXAM: Vitals:   03/01/16 1525  BP: (!) 142/87  Pulse: 72  Resp: 16  Temp: 97.6 F (36.4 C)  TempSrc: Oral  SpO2: 99%  Weight: 214 lb 8 oz (97.3 kg)  Height: 5\' 7"  (1.702 m)    GENERAL: The patient is a well-nourished female, in no acute distress. The vital signs are documented above. CARDIOVASCULAR: 2+ dorsalis pedis pulses bilaterally PULMONARY: There is good air exchange  MUSCULOSKELETAL: There are no major deformities or cyanosis. NEUROLOGIC: No focal weakness or paresthesias are detected. SKIN: There are no ulcers or rashes noted. PSYCHIATRIC: The patient has a normal affect. He does have mild swelling in her feet bilaterally. There are no skin changes to suggest hemosiderin deposit or other chronic stasis disease.  DATA:  She underwent repeat duplex. This shows mild reflux in her great saphenous vein with a small vein diameter. Mild reflux in her deep system as well.  MEDICAL ISSUES: Had long discussion with the patient. Unclear as to the etiology of her symptoms but it does not seem typical for a venous hypertension related the total leg discomfort and more dependent related. She does not have any correctable hypertension under surface veins and mild reflux in her deep veins. I feel that this very well may be neurologic versus orthopedic issues. She is questioning whether higher grade of compression may give relief. Explained that these are very difficult to tolerate but she has tolerated 2030. She was given a prescription for 30 40 mmHg thigh and knee-high compression to see whether these would give her improvement. She will see [place] again on an as-needed basis. We have referred her back to neurology at her request for ongoing evaluation    Rosetta Posner, MD [facility]  [facility] Tel [phone] Pager [phone]

## 2016-03-02 ENCOUNTER — Ambulatory Visit (INDEPENDENT_AMBULATORY_CARE_PROVIDER_SITE_OTHER): Payer: BC Managed Care – PPO | Admitting: Specialist

## 2016-03-29 ENCOUNTER — Ambulatory Visit (INDEPENDENT_AMBULATORY_CARE_PROVIDER_SITE_OTHER): Payer: BC Managed Care – PPO | Admitting: Diagnostic Neuroimaging

## 2016-03-29 ENCOUNTER — Encounter: Payer: Self-pay | Admitting: Diagnostic Neuroimaging

## 2016-03-29 VITALS — BP 139/82 | HR 73 | Wt 217.6 lb

## 2016-03-29 DIAGNOSIS — R29898 Other symptoms and signs involving the musculoskeletal system: Secondary | ICD-10-CM

## 2016-03-29 DIAGNOSIS — M5442 Lumbago with sciatica, left side: Secondary | ICD-10-CM

## 2016-03-29 DIAGNOSIS — M2141 Flat foot [pes planus] (acquired), right foot: Secondary | ICD-10-CM

## 2016-03-29 DIAGNOSIS — G8929 Other chronic pain: Secondary | ICD-10-CM

## 2016-03-29 DIAGNOSIS — M4802 Spinal stenosis, cervical region: Secondary | ICD-10-CM

## 2016-03-29 DIAGNOSIS — M5441 Lumbago with sciatica, right side: Secondary | ICD-10-CM

## 2016-03-29 NOTE — Progress Notes (Addendum)
[facility]  PATIENT: Carolyn [name] DOB: 1965/11/28  REFERRING CLINICIAN: Nitka  HISTORY FROM: patient  REASON FOR VISIT: follow up    HISTORICAL  CHIEF COMPLAINT:  Chief Complaint  Patient presents with  . Hyperreflexia    rm 6, "numbness in both feet, esp R; pain on outside of legs at night"  . Follow-up    HISTORY OF PRESENT ILLNESS:   UPDATE 03/29/16: Since last visit, had MRI c-spine and then went to 2nd neck surgery in Nov [id number], complicated by esophageal tear, pneumonia, and 10 day hospital stay. Now here at request by Dr. Donnetta Hutching for evaluation of bilateral ankle pain, weakness and numbness (right worse than left; worse with driving or sitting). Symptoms date back to sometime between 2014 and 2016, but worse in last few months. Also with ankle stiffness since 2003 (during eclampsia).   PRIOR HPI (01/13/15): 49 year old right-handed female here for evaluation of hyperreflexia and cervical spinal stenosis. Patient reports history of swallowing difficulties and neck stiffness problems for several years. She went to ENT, had x-ray of the cervical spine was found to have significant anterior flowing ossifications consistent with diffuse idiopathic skeletal hyperostosis. She was referred to orthopedic surgery, Dr. Louanne Skye, who performed surgical remover of anterior bone bridging and bone spurs. Immediately following surgery patient had significant improvement in range of motion of her neck as well as resolution of her swallowing difficulty. However immediately following surgery, she noticed progressively worsening numbness and tingling in her hands, weakness in her shoulders, stiffness and cramps in her ankles and lower extremities. Patient had follow-up evaluations with orthopedic surgery, with repeat MRI studies showing cervical spinal stenosis at C3-4 level. Patient was also found to have hyper reflexia in the upper and lower extremities. Over the past 3 months patient  has had increasing problems with bladder dysfunction, now having to wear pads every day. Also patient having significant depression and anxiety symptoms with associated sleep disturbance and insomnia. Patient referred to me for evaluation and neurologic opinion on whether patient's upper and lower extremity symptoms and hyperreflexia are related to cervical spinal stenosis or an alternate brain/upper motor neuron process.   REVIEW OF SYSTEMS: Full 14 system review of systems performed and negative except: memory loss numbness fatigue hearing loss trouble swallowing freq waking incont bladder freq urination.   ALLERGIES: No Known Allergies  HOME MEDICATIONS: Outpatient Medications Prior to Visit  Medication Sig Dispense Refill  . cycloSPORINE (RESTASIS) 0.05 % ophthalmic emulsion Place 1 drop into both eyes 2 (two) times daily.    [name] LOSARTAN POTASSIUM-HCTZ PO Take by mouth daily.    . meloxicam (MOBIC) 15 MG tablet Take 15 mg by mouth as needed for pain.    [name] acetaminophen (TYLENOL) 500 MG tablet Take 1,000 mg by mouth every 8 (eight) hours as needed for mild pain or moderate pain.    [name] esomeprazole (NEXIUM) 20 MG capsule Take 20 mg by mouth daily as needed.     [name] guaiFENesin (ROBITUSSIN) 100 MG/5ML SOLN Take 15 mLs (300 mg total) by mouth every 6 (six) hours as needed for cough or to loosen phlegm. (Patient not taking: Reported on 03/01/2016) 236 mL 2  . HYDROcodone-acetaminophen (HYCET) 7.5-325 mg/15 ml solution Take 15 mLs by mouth every 3 (three) hours as needed for moderate pain. 236 mL 0  . Maltodextrin-Xanthan Gum (RESOURCE THICKENUP CLEAR) POWD Use to thicken fluids to nectar thick (Patient not taking: Reported on 03/01/2016) 3 Can 0  . phenol (CHLORASEPTIC) 1.4 %  LIQD Use as directed 1 spray in the mouth or throat as needed for throat irritation / pain. (Patient not taking: Reported on 03/01/2016) 118 mL 2   No facility-administered medications prior to visit.     PAST MEDICAL  HISTORY: Past Medical History:  Diagnosis Date  . Anxiety   . Arthritis    patient unsure  . Depression    but doesn't require any meds  . Diffuse idiopathic skeletal hyperostosis   . Dry eyes    uses Restasis bid  . GERD (gastroesophageal reflux disease)   . Headache(784.0)    MRI in 2007 bc of headaches  . Heart murmur    New diagnosis  . Hypertension    takes Hyzaar daily  . Neck pain    bone spur on esophagus  . PONV (postoperative nausea and vomiting)    nausea and vomiting   . Urinary frequency     PAST SURGICAL HISTORY: Past Surgical History:  Procedure Laterality Date  . ABDOMINAL HYSTERECTOMY  2011   bleeding  . ANTERIOR CERVICAL DECOMP/DISCECTOMY FUSION N/A 08/21/2012   Procedure: Excision of exostosis C2-3,C3-4, C4-5;  Surgeon: Jessy Oto, MD;  Location: [facility];  Service: Orthopedics;  Laterality: N/A;  . ANTERIOR CERVICAL DECOMP/DISCECTOMY FUSION N/A 03/03/2015   Procedure: ANTERIOR CERVICAL DISCECTOMY AND FUSION WITH PARTIAL VERTEBRECTOMY C3 AND C4, CERVICAL PLATE AND SCREWS, ALLOGRAFT, LOCAL BONE GRAFT, VIVIGEN;  Surgeon: Jessy Oto, MD;  Location: [facility];  Service: Orthopedics;  Laterality: N/A;  . DIRECT LARYNGOSCOPY  03/03/2015   Procedure: DIRECT LARYNGOSCOPY AND PLACEMENT OF FEEDING TUBE;  Surgeon: [name], MD;  Location: [facility];  Service: ENT;;  . REPAIR OF ESOPHAGUS  03/03/2015   Procedure: REPAIR OF ESOPHAGUS;  Surgeon: [name], MD;  Location: [facility];  Service: ENT;;    FAMILY HISTORY: Family History  Problem Relation Age of Onset  . Heart murmur Father   . Hypertension Father   . Hypertension Mother   . Cancer Paternal Aunt     uterine  . Cancer Maternal Grandmother     lung  . Cancer Maternal Grandfather     stomach  . Cancer Paternal Grandmother     lung  . Hypertension      SOCIAL HISTORY:  Social History   Social History  . Marital status: Married    Spouse name: N/A  . Number of children: 3  . Years of education:  N/A   Occupational History  .      Extension Agent, [employer]   Social History Main Topics  . Smoking status: Never Smoker  . Smokeless tobacco: Never Used  . Alcohol use Yes     Comment: rarely  . Drug use: No  . Sexual activity: Yes    Birth control/ protection: Surgical   Other Topics Concern  . Not on file   Social History Narrative   Married, lives with husband, daughter   Caffeine use - coffee 5 cups daily     PHYSICAL EXAM  GENERAL EXAM/CONSTITUTIONAL: Vitals:  Vitals:   03/29/16 1339  BP: 139/82  Pulse: 73  Weight: 217 lb 9.6 oz (98.7 kg)   Body mass index is 34.08 kg/m. No exam data present  Patient is in no distress; well developed, nourished and groomed; neck is supple  CARDIOVASCULAR:  Examination of carotid arteries is normal; no carotid bruits  Regular rate and rhythm, no murmurs  Examination of peripheral vascular system by observation and palpation is normal  EYES:  Ophthalmoscopic exam of optic discs and posterior segments is normal; no papilledema or hemorrhages  MUSCULOSKELETAL:  Gait, strength, tone, movements noted in Neurologic exam below  NEUROLOGIC: MENTAL STATUS:  No flowsheet data found.  awake, alert, oriented to person, place and time  recent and remote memory intact  normal attention and concentration  language fluent, comprehension intact, naming intact,   fund of knowledge appropriate  CRANIAL NERVE:   2nd - no papilledema on fundoscopic exam  2nd, 3rd, 4th, 6th - pupils equal and reactive to light, visual fields full to confrontation, extraocular muscles intact, no nystagmus  5th - facial sensation symmetric  7th - facial strength symmetric  8th - hearing intact  9th - palate elevates symmetrically, uvula midline  11th - shoulder shrug symmetric  12th - tongue protrusion midline  MOTOR:   normal bulk; NORMAL TONE IN BUE; SLIGHT INCR TONE IN BLE; DECR DELTOIDS (4+; DIFF RAISING HANDS OVER HEAD);  OTHER BUE MUSCLES 5; BLE (R HF 4, L HF 5; KE 4, KF 4, DF 5)  SENSORY:   normal and symmetric to light touch, temperature, vibration; DECR PP IN UPPER AND LOWER EXT  COORDINATION:   finger-nose-finger, fine finger movements normal  REFLEXES:   deep tendon reflexes : BUE 2; KNEES 3; ANKLES 2; TOES DOWN GOING; BILATERAL HOFFMANS POSITIVE  GAIT/STATION:   SLIGHTLY CAUTIOUS, STIFF GAIT; narrow based gait; able to walk on toes, heels and tandem; romberg is negative    DIAGNOSTIC DATA (LABS, IMAGING, TESTING) - I reviewed patient records, labs, notes, testing and imaging myself where available.  Lab Results  Component Value Date   WBC 9.1 03/13/2015   HGB 9.6 (L) 03/13/2015   HCT 28.3 (L) 03/13/2015   MCV 85.8 03/13/2015   PLT 174 03/13/2015      Component Value Date/Time   NA 140 03/13/2015 0546   K 3.3 (L) 03/13/2015 0546   CL 106 03/13/2015 0546   CO2 25 03/13/2015 0546   GLUCOSE 111 (H) 03/13/2015 0546   BUN 9 03/13/2015 0546   CREATININE 1.30 (H) 03/13/2015 0546   CALCIUM 9.0 03/13/2015 0546   PROT 7.1 02/23/2015 0939   ALBUMIN 3.9 02/23/2015 0939   AST 19 02/23/2015 0939   ALT 21 02/23/2015 0939   ALKPHOS 58 02/23/2015 0939   BILITOT 0.8 02/23/2015 0939   GFRNONAA 47 (L) 03/13/2015 0546   GFRAA 55 (L) 03/13/2015 0546   No results found for: CHOL, HDL, LDLCALC, LDLDIRECT, TRIG, CHOLHDL No results found for: HGBA1C No results found for: VITAMINB12 No results found for: TSH   12/08/14 MRI cervical spine [I reviewed images myself and agree with interpretation. I would grade spinal stenosis at C3-4 as moderate. No cord signal abnormalities. -VRP]  1. At C4-5 there is a mild broad-based disc bulge with a focal left foraminal disc osteophyte complex resulting in severe left foraminal stenosis. 2. At C3-4 there is a broad central disc protrusion abutting the ventral cervical spinal cord. Bilateral uncovertebral degenerative changes.  02/04/15 MRI brain  Equivocal  MRI brain (without) demonstrating: 1. Subtle left parietal subcortical focus of non-specific gliosis. 2. No acute findings.  02/04/15 MRI cervical spine 1. At C3-4: disc bulging and facet hypertrophy with moderate spinal stenosis and moderate-severe biforaminal stenosis; no cord signal changes. 2. At C4-5: disc bulging and facet hypertrophy with mild spinal stenosis and mild right and moderate left foraminal stenosis; no cord signal changes. 3. Compared to MRI on 12/08/14, spinal stenosis at C3-4 has  slightly increased.     ASSESSMENT AND PLAN  50 y.o. year old female here with history of DISH (dx'd after dysphagia evaluation), with improvement in dyphagia after spur removal in 2014, but development and worsening of bilateral hand numbness, leg/foot stiffness post-operatively, with increasing bladder dysfunction in last 3 months. Exam notable for weakness in BUE and BLE; slight hyperreflexia in knees.   Overall I think patient's symptoms are likely related to her moderate cervical spinal stenosis at C3-4, as well as severe left foraminal stenosis at C4-5 and mild biforaminal stenosis at C5-6 especially given the timing of onset of symptoms following her surgery. This may have created some dynamic intermittent compression as queried by Dr. Louanne Skye himself. Now s/p cervical decompression and fusion.   Now also with worsening right foot ankle stiffness / numbness / weakness (dating back to 2014), also with low back pain. More pain with flexion or extension of lumbar spine.   Ddx: cervical myelopathy sequelae +/- lumbar radiculopathy / spinal stenosis   Spinal stenosis in cervical region  Weakness of foot, right  Chronic bilateral low back pain with bilateral sciatica    PLAN: I spent 40 minutes of face to face time with patient. Greater than 50% of time was spent in counseling and coordination of care with patient. In summary we discussed:  - foot/ankle problem may be related to cervical  spine or lumbar spine issues - check MRI lumbar spine - check labs - consider physical therapy for right foot weakness issues  Orders Placed This Encounter  Procedures  . MR LUMBAR SPINE WO CONTRAST  . Vitamin B12  . Hemoglobin A1c   Return in about 3 months (around 06/27/2016).    Penni Bombard, MD [id number], [id number] PM Certified in Neurology, Neurophysiology and Neuroimaging  [facility] [address], [place],  [address] [phone]

## 2016-03-30 LAB — HEMOGLOBIN A1C
Est. average glucose Bld gHb Est-mCnc: 108 mg/dL
HEMOGLOBIN A1C: 5.4 % (ref 4.8–5.6)

## 2016-03-30 LAB — VITAMIN B12: Vitamin B-12: 763 pg/mL (ref 232–1245)

## 2016-03-31 ENCOUNTER — Telehealth: Payer: Self-pay | Admitting: *Deleted

## 2016-03-31 ENCOUNTER — Encounter: Payer: Self-pay | Admitting: *Deleted

## 2016-03-31 NOTE — Telephone Encounter (Signed)
Patient called. She spoke to Anderson Malta, Dr. Jannifer Franklin' nurse who told patient unremarkable labs and to continue current plan.

## 2016-03-31 NOTE — Telephone Encounter (Signed)
-----   Message from Penni Bombard, MD sent at 03/30/2016  5:11 PM EST ----- Unremarkable labs. Continue current plan. Please call patient. -VRP

## 2016-03-31 NOTE — Telephone Encounter (Signed)
LVM for pt to call about results. Gave [facility] phone number.  

## 2016-03-31 NOTE — Telephone Encounter (Signed)
Noted, thank you

## 2016-04-05 ENCOUNTER — Telehealth: Payer: Self-pay | Admitting: Diagnostic Neuroimaging

## 2016-04-05 NOTE — Telephone Encounter (Signed)
I called the patient and informed her how much it will be and she understands

## 2016-04-05 NOTE — Telephone Encounter (Signed)
Pt called to advise the insurance has made a mistake and the ded has been met but the oop has not and she will need to pay 15%. She wants to know what will 15% be.

## 2016-04-06 ENCOUNTER — Ambulatory Visit (INDEPENDENT_AMBULATORY_CARE_PROVIDER_SITE_OTHER): Payer: BC Managed Care – PPO

## 2016-04-06 DIAGNOSIS — M5441 Lumbago with sciatica, right side: Secondary | ICD-10-CM

## 2016-04-06 DIAGNOSIS — M2141 Flat foot [pes planus] (acquired), right foot: Secondary | ICD-10-CM

## 2016-04-06 DIAGNOSIS — M4802 Spinal stenosis, cervical region: Secondary | ICD-10-CM

## 2016-04-06 DIAGNOSIS — M5442 Lumbago with sciatica, left side: Secondary | ICD-10-CM

## 2016-04-06 DIAGNOSIS — G8929 Other chronic pain: Secondary | ICD-10-CM | POA: Diagnosis not present

## 2016-04-06 DIAGNOSIS — R29898 Other symptoms and signs involving the musculoskeletal system: Secondary | ICD-10-CM

## 2016-04-11 ENCOUNTER — Encounter (INDEPENDENT_AMBULATORY_CARE_PROVIDER_SITE_OTHER): Payer: Self-pay | Admitting: Specialist

## 2016-04-11 ENCOUNTER — Ambulatory Visit (INDEPENDENT_AMBULATORY_CARE_PROVIDER_SITE_OTHER): Payer: BC Managed Care – PPO | Admitting: Specialist

## 2016-04-11 ENCOUNTER — Telehealth: Payer: Self-pay | Admitting: *Deleted

## 2016-04-11 VITALS — BP 170/107 | HR 73 | Ht 68.0 in | Wt 217.0 lb

## 2016-04-11 DIAGNOSIS — R200 Anesthesia of skin: Secondary | ICD-10-CM

## 2016-04-11 DIAGNOSIS — M4712 Other spondylosis with myelopathy, cervical region: Secondary | ICD-10-CM | POA: Diagnosis not present

## 2016-04-11 DIAGNOSIS — M4322 Fusion of spine, cervical region: Secondary | ICD-10-CM

## 2016-04-11 MED ORDER — IBUPROFEN-FAMOTIDINE 800-26.6 MG PO TABS: 1.0000 | ORAL_TABLET | Freq: Two times a day (BID) | ORAL | 3 refills | Status: DC

## 2016-04-11 NOTE — Telephone Encounter (Signed)
LVM requesting patient call back re: MRI lumbar spine results.

## 2016-04-11 NOTE — Progress Notes (Deleted)
5

## 2016-04-11 NOTE — Patient Instructions (Signed)
Avoid overhead lifting and overhead use of the arms. Do not lift greater than 5 lbs. Adjust head rest in vehicle to prevent hyperextension if rear ended.   

## 2016-04-11 NOTE — Progress Notes (Signed)
Office Visit Note   Patient: Carolyn [name]           Date of Birth: 1965-10-28           MRN: [id number] Visit Date: 04/11/2016              Requested by: Tawni Carnes, PA-C [address], [place] [address] PCP: Tawni Carnes, PA-C   Assessment & Plan: Visit Diagnoses:  1. Lower extremity numbness   2. Cervical vertebral fusion   3. Other spondylosis with myelopathy, cervical region     Plan:Avoid overhead lifting and overhead use of the arms. Do not lift greater than 5 lbs. Adjust head rest in vehicle to prevent hyperextension if rear ended.    Follow-Up Instructions: Return in about 3 weeks (around 05/02/2016).   Orders:  Orders Placed This Encounter  Procedures  . MR Cervical Spine w/o contrast   Meds ordered this encounter  Medications  . Ibuprofen-Famotidine 800-26.6 MG TABS    Sig: Take 1 tablet by mouth 2 (two) times daily after a meal.    Dispense:  60 tablet    Refill:  3      Procedures: No procedures performed   Clinical Data: No additional findings.   Subjective: Chief Complaint  Patient presents with  . Lower Back - Pain  . Neck - Pain    Patient returns to follow up neck pain. She is status post ACDF with partial vertebreotomy C3 and C4 plates and screws and esophageal repair on 03/13/2015.  She states that she has been going to physical therapy which has helped a lot with her range of motion. She does hear a grinding which has gotten her very worried. She has also recently had a MRI of her lumbar spine ordered by her neurologist due to the continued pain in her feet. She states that the physical therapy has not helped that very much. She has not received those results yet.Feels grating over the posterior aspect of the cervical spine. Make noise and feeling of grinding sensation. Plain radiographs from her last visit c-spine show motion at the C2-3 level with vacuum sign but C3-4 appears solid. Previously had tried indocin but now is  intolerant of this due to stomach upset.     Review of Systems  Constitutional: Negative.   HENT: Negative.   Eyes: Positive for visual disturbance.  Respiratory: Negative.   Cardiovascular: Negative.   Gastrointestinal: Negative.   Genitourinary: Negative.   Musculoskeletal: Positive for neck pain and neck stiffness.  Allergic/Immunologic: Negative.   Neurological: Negative.   Hematological: Negative.   Psychiatric/Behavioral: Negative.      Objective: Vital Signs: BP (!) 170/107   Pulse 73   Ht 5\' 8"  (1.727 m)   Wt 217 lb (98.4 kg)   BMI 32.99 kg/m   Physical Exam  Constitutional: She is oriented to person, place, and time. She appears well-developed and well-nourished.  HENT:  Head: Normocephalic and atraumatic.  Eyes: EOM are normal. Pupils are equal, round, and reactive to light.  Neck: Normal range of motion. Neck supple.  Pulmonary/Chest: Effort normal and breath sounds normal.  Abdominal: Soft.  Neurological: She is oriented to person, place, and time.  Skin: Skin is warm and dry.  Psychiatric: She has a normal mood and affect. Her behavior is normal. Judgment and thought content normal.    Back Exam   Tenderness  The patient is experiencing tenderness in the cervical.  Range of Motion  Extension: abnormal  Flexion: abnormal  Lateral Bend Right: abnormal  Lateral Bend Left: abnormal  Rotation Right: abnormal  Rotation Left: abnormal   Muscle Strength  Right Quadriceps:  5/5  Left Quadriceps:  5/5  Right Hamstrings:  5/5  Left Hamstrings:  5/5   Tests  Straight leg raise right: negative Straight leg raise left: negative  Reflexes  Patellar: Hyperreflexic Achilles: Hyperreflexic Biceps: Hyperreflexic Babinski's sign: abnormal   Other  Toe Walk: normal Heel Walk: normal Sensation: normal Gait: normal  Erythema: no back redness Scars: absent      Specialty Comments:  No specialty comments available.  Imaging: No results  found.   PMFS History: Patient Active Problem List   Diagnosis Date Noted  . Dysphagia, pharyngoesophageal phase 03/12/2015    Priority: High    Class: Acute  . Esophageal tear 03/04/2015    Priority: High    Class: Acute  . HNP (herniated nucleus pulposus) with myelopathy, cervical 03/03/2015    Priority: High    Class: Chronic  . Spinal stenosis in cervical region 03/03/2015    Priority: High    Class: Chronic  . Family history of DVT 03/05/2015  . HCAP (healthcare-associated pneumonia) 03/05/2015  . Bilateral lower extremity edema 03/05/2015  . Cough   . S/P cervical spinal fusion 03/03/2015  . Chest pain 12/18/2012  . Hypertension   . Arthritis   . Urinary frequency   . Heart murmur   . Dry eyes   . Depression   . 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 Other dysphagia 08/31/2012    Class: Diagnosis of   Past Medical History:  Diagnosis Date  . Anxiety   . Arthritis    patient unsure  . Depression    but doesn't require any meds  . Diffuse idiopathic skeletal hyperostosis   . Dry eyes    uses Restasis bid  . GERD (gastroesophageal reflux disease)   . Headache(784.0)    MRI in 2007 bc of headaches  . Heart murmur    New diagnosis  . Hypertension    takes Hyzaar daily  . Neck pain    bone spur on esophagus  . PONV (postoperative nausea and vomiting)    nausea and vomiting   . Urinary frequency     Family History  Problem Relation Age of Onset  . Heart murmur Father   . Hypertension Father   . Hypertension Mother   . Cancer Paternal Aunt     uterine  . Cancer Maternal Grandmother     lung  . Cancer Maternal Grandfather     stomach  . Cancer Paternal Grandmother     lung  . Hypertension      Past Surgical History:  Procedure Laterality Date  . ABDOMINAL HYSTERECTOMY  2011   bleeding  . ANTERIOR CERVICAL DECOMP/DISCECTOMY FUSION N/A 08/21/2012   Procedure: Excision of exostosis C2-3,C3-4, C4-5;  Surgeon:  Jessy Oto, MD;  Location: [facility];  Service: Orthopedics;  Laterality: N/A;  . ANTERIOR CERVICAL DECOMP/DISCECTOMY FUSION N/A 03/03/2015   Procedure: ANTERIOR CERVICAL DISCECTOMY AND FUSION WITH PARTIAL VERTEBRECTOMY C3 AND C4, CERVICAL PLATE AND SCREWS, ALLOGRAFT, LOCAL BONE GRAFT, VIVIGEN;  Surgeon: Jessy Oto, MD;  Location: [facility];  Service: Orthopedics;  Laterality: N/A;  . DIRECT LARYNGOSCOPY  03/03/2015   Procedure: DIRECT LARYNGOSCOPY AND PLACEMENT OF FEEDING TUBE;  Surgeon: [name], MD;  Location: [facility];  Service: ENT;;  . REPAIR OF ESOPHAGUS  03/03/2015  Procedure: REPAIR OF ESOPHAGUS;  Surgeon: [name], MD;  Location: [facility];  Service: ENT;;   Social History   Occupational History  .      Extension Agent, [employer]   Social History Main Topics  . Smoking status: Never Smoker  . Smokeless tobacco: Never Used  . Alcohol use Yes     Comment: rarely  . Drug use: No  . Sexual activity: Yes    Birth control/ protection: Surgical

## 2016-04-12 NOTE — Telephone Encounter (Signed)
Just got off the phone with the patient stated she hasnt heard the results with her MRI she said to call her work number.

## 2016-04-12 NOTE — Telephone Encounter (Signed)
LVM #2 requesting a call back for MRI results.

## 2016-04-13 ENCOUNTER — Telehealth (INDEPENDENT_AMBULATORY_CARE_PROVIDER_SITE_OTHER): Payer: Self-pay | Admitting: *Deleted

## 2016-04-13 NOTE — Telephone Encounter (Signed)
Pt has appt for MRI at [facility] on Dec 26th at 10:30am with arrival at 10:00am, tried calling pt no vm set up, will try again later

## 2016-04-13 NOTE — Telephone Encounter (Signed)
Left VM informing patient that per Dr Leta Baptist, her MRI brain showed unremarkable imaging results. There is mild disc bulging but no major findings. He recommends conservative management and will continue with her current treatment plan. Left number for any questions.

## 2016-04-14 NOTE — Telephone Encounter (Signed)
Pt aware of appt.

## 2016-04-15 ENCOUNTER — Telehealth (INDEPENDENT_AMBULATORY_CARE_PROVIDER_SITE_OTHER): Payer: Self-pay | Admitting: Specialist

## 2016-04-15 NOTE — Telephone Encounter (Signed)
Please advise look like only rx that was suppose to go to pharmacy was ibuprofen, please advise.

## 2016-04-15 NOTE — Telephone Encounter (Signed)
Patient called advised Dr Louanne Skye was going to write a Rx for her 04/11/16. Patient said the Rx was not called into the pharmacy. Patient uses the [facility] in [place]. The number to contact her is [phone]

## 2016-04-20 ENCOUNTER — Telehealth (INDEPENDENT_AMBULATORY_CARE_PROVIDER_SITE_OTHER): Payer: Self-pay | Admitting: Specialist

## 2016-04-20 NOTE — Telephone Encounter (Signed)
[facility] called and asked me to remind you of the rx request on the 22nd for pt, I do see that Dr. Louanne Skye hasn't responded yet. Thanks

## 2016-04-21 ENCOUNTER — Other Ambulatory Visit (INDEPENDENT_AMBULATORY_CARE_PROVIDER_SITE_OTHER): Payer: Self-pay | Admitting: Specialist

## 2016-04-21 MED ORDER — IBUPROFEN-FAMOTIDINE 800-26.6 MG PO TABS: 1.0000 | ORAL_TABLET | Freq: Two times a day (BID) | ORAL | 3 refills | Status: DC

## 2016-04-21 NOTE — Telephone Encounter (Signed)
Rx printed and signed for patient to pick up. jen This Rx probably should be faxed to her pharmacy since I can not fax it. jen

## 2016-04-21 NOTE — Telephone Encounter (Signed)
Called into pharmacy

## 2016-04-22 NOTE — Telephone Encounter (Signed)
Called into pharmacy

## 2016-05-10 ENCOUNTER — Telehealth (INDEPENDENT_AMBULATORY_CARE_PROVIDER_SITE_OTHER): Payer: Self-pay | Admitting: Specialist

## 2016-05-10 ENCOUNTER — Other Ambulatory Visit (INDEPENDENT_AMBULATORY_CARE_PROVIDER_SITE_OTHER): Payer: Self-pay | Admitting: Radiology

## 2016-05-10 MED ORDER — IBUPROFEN-FAMOTIDINE 800-26.6 MG PO TABS: 1.0000 | ORAL_TABLET | Freq: Two times a day (BID) | ORAL | 3 refills | Status: DC

## 2016-05-10 NOTE — Telephone Encounter (Signed)
Patient was prescribed duexis but the rx is over $600, the pharmacist told her if she received the two drugs separately that they were only $10 a piece. Pharmacy is [facility] in [place]. Cb#: [phone]

## 2016-05-10 NOTE — Telephone Encounter (Signed)
I refaxed rx to pharm-[facility], [place]-- I called and left message on machine for patient about this.  I advised that this will be a lot cheaper for her, and that they will be call her for additional info. To look for and out of state number to be calling her.  If she has questions she can call me back.

## 2016-05-16 ENCOUNTER — Telehealth (INDEPENDENT_AMBULATORY_CARE_PROVIDER_SITE_OTHER): Payer: Self-pay | Admitting: Specialist

## 2016-05-16 ENCOUNTER — Other Ambulatory Visit (INDEPENDENT_AMBULATORY_CARE_PROVIDER_SITE_OTHER): Payer: Self-pay | Admitting: Radiology

## 2016-05-16 MED ORDER — IBUPROFEN-FAMOTIDINE 800-26.6 MG PO TABS: 1.0000 | ORAL_TABLET | Freq: Two times a day (BID) | ORAL | 3 refills | Status: DC

## 2016-05-16 NOTE — Telephone Encounter (Signed)
Pt still has not heard from [facility]  (PHARMACY) re the Rx that was $600 at other pharmacies.  Can you give her an idea of what the hold up might be?

## 2016-05-16 NOTE — Telephone Encounter (Signed)
I re-faxed rx to [facility].---I called and left message on machine for patient to advise that I had done this and I gave her their number to call with questions.

## 2016-06-24 ENCOUNTER — Ambulatory Visit (INDEPENDENT_AMBULATORY_CARE_PROVIDER_SITE_OTHER): Payer: BC Managed Care – PPO | Admitting: Specialist

## 2016-06-24 ENCOUNTER — Encounter (INDEPENDENT_AMBULATORY_CARE_PROVIDER_SITE_OTHER): Payer: Self-pay | Admitting: Specialist

## 2016-06-24 VITALS — BP 145/77 | HR 59 | Ht 68.0 in | Wt 217.0 lb

## 2016-06-24 DIAGNOSIS — Z981 Arthrodesis status: Secondary | ICD-10-CM

## 2016-06-24 DIAGNOSIS — M4810 Ankylosing hyperostosis [Forestier], site unspecified: Secondary | ICD-10-CM | POA: Diagnosis not present

## 2016-06-24 NOTE — Progress Notes (Signed)
Office Visit Note   Patient: Carolyn [name]           Date of Birth: 1965/10/09           MRN: [id number] Visit Date: 06/24/2016              Requested by: Tawni Carnes, PA-C [place], [place] [address] PCP: Tawni Carnes, PA-C   Assessment & Plan: Visit Diagnoses:  1. S/P cervical spinal fusion   2. DISH (disseminated idiopathic skeletal hyperostosis)     Plan:Avoid overhead lifting and overhead use of the arms. Do not lift greater than 5 lbs. Adjust head rest in vehicle to prevent hyperextension if rear ended.   Follow-Up Instructions: Return in about 1 year (around 06/24/2017).   Orders:  No orders of the defined types were placed in this encounter.  No orders of the defined types were placed in this encounter.     Procedures: No procedures performed   Clinical Data: No additional findings.   Subjective: Chief Complaint  Patient presents with  . Lower Back - Follow-up    [name] is here to follo up on he back and lower extremity numbness.  She states that is doing ok, that she is getting use to it. MRI done 03/2016, at [facility], report is available on  Care everywhere but not a visible study. Numbness and tingling right foot bottom greater than top of foot. Toes are worse with cramping and shooting pain. MRI of the lumbar spine with mild foramenal stenosis  At the L3-4 and L4-5 levels but L5-S1 not showing significant.    Review of Systems  Constitutional: Negative.   HENT: Negative.   Eyes: Negative.   Respiratory: Negative.   Cardiovascular: Negative.   Gastrointestinal: Negative.   Endocrine: Negative.   Genitourinary: Negative.   Musculoskeletal: Negative.   Skin: Negative.   Allergic/Immunologic: Negative.   Neurological: Negative.   Hematological: Negative.   Psychiatric/Behavioral: Negative.      Objective: Vital Signs: BP (!) 145/77 (BP Location: Left Arm, Patient Position: Sitting)   Pulse (!) 59   Ht 5\' 8"  (1.727 m)    Wt 217 lb (98.4 kg)   BMI 32.99 kg/m   Physical Exam  Constitutional: She is oriented to person, place, and time. She appears well-developed and well-nourished.  HENT:  Head: Normocephalic and atraumatic.  Eyes: EOM are normal. Pupils are equal, round, and reactive to light.  Neck: Normal range of motion. Neck supple.  Pulmonary/Chest: Effort normal and breath sounds normal.  Abdominal: Soft. Bowel sounds are normal.  Neurological: She is alert and oriented to person, place, and time.  Skin: Skin is warm and dry.  Psychiatric: She has a normal mood and affect. Her behavior is normal. Judgment and thought content normal.    Back Exam   Tenderness  The patient is experiencing tenderness in the cervical and lumbar.  Range of Motion  Extension: abnormal  Flexion: abnormal  Lateral Bend Right: abnormal  Lateral Bend Left: abnormal  Rotation Right: abnormal  Rotation Left: abnormal   Muscle Strength  Right Quadriceps:  5/5  Left Quadriceps:  5/5  Right Hamstrings:  5/5  Left Hamstrings:  5/5   Tests  Straight leg raise left: negative  Reflexes  Patellar: normal Achilles: normal Biceps:  Hyperreflexic normal Babinski's sign: normal   Other  Toe Walk: normal Heel Walk: normal Sensation: normal Erythema: no back redness Scars: absent  Comments:  Hoffman's sign both hands. Tone in the  ankles and legs is increased.      Specialty Comments:  No specialty comments available.  Imaging: No results found.   PMFS History: Patient Active Problem List   Diagnosis Date Noted  . Dysphagia, pharyngoesophageal phase 03/12/2015    Priority: High    Class: Acute  . Esophageal tear 03/04/2015    Priority: High    Class: Acute  . HNP (herniated nucleus pulposus) with myelopathy, cervical 03/03/2015    Priority: High    Class: Chronic  . Spinal stenosis in cervical region 03/03/2015    Priority: High    Class: Chronic  . Family history of DVT 03/05/2015  . HCAP  (healthcare-associated pneumonia) 03/05/2015  . Bilateral lower extremity edema 03/05/2015  . Cough   . S/P cervical spinal fusion 03/03/2015  . Chest pain 12/18/2012  . Hypertension   . Arthritis   . Urinary frequency   . Heart murmur   . Dry eyes   . Depression   . 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 Other dysphagia 08/31/2012    Class: Diagnosis of   Past Medical History:  Diagnosis Date  . Anxiety   . Arthritis    patient unsure  . Depression    but doesn't require any meds  . Diffuse idiopathic skeletal hyperostosis   . Dry eyes    uses Restasis bid  . GERD (gastroesophageal reflux disease)   . Headache(784.0)    MRI in 2007 bc of headaches  . Heart murmur    New diagnosis  . Hypertension    takes Hyzaar daily  . Neck pain    bone spur on esophagus  . PONV (postoperative nausea and vomiting)    nausea and vomiting   . Urinary frequency     Family History  Problem Relation Age of Onset  . Heart murmur Father   . Hypertension Father   . Hypertension Mother   . Cancer Paternal Aunt     uterine  . Cancer Maternal Grandmother     lung  . Cancer Maternal Grandfather     stomach  . Cancer Paternal Grandmother     lung  . Hypertension      Past Surgical History:  Procedure Laterality Date  . ABDOMINAL HYSTERECTOMY  2011   bleeding  . ANTERIOR CERVICAL DECOMP/DISCECTOMY FUSION N/A 08/21/2012   Procedure: Excision of exostosis C2-3,C3-4, C4-5;  Surgeon: Jessy Oto, MD;  Location: [facility];  Service: Orthopedics;  Laterality: N/A;  . ANTERIOR CERVICAL DECOMP/DISCECTOMY FUSION N/A 03/03/2015   Procedure: ANTERIOR CERVICAL DISCECTOMY AND FUSION WITH PARTIAL VERTEBRECTOMY C3 AND C4, CERVICAL PLATE AND SCREWS, ALLOGRAFT, LOCAL BONE GRAFT, VIVIGEN;  Surgeon: Jessy Oto, MD;  Location: [facility];  Service: Orthopedics;  Laterality: N/A;  . DIRECT LARYNGOSCOPY  03/03/2015   Procedure: DIRECT LARYNGOSCOPY AND PLACEMENT OF FEEDING  TUBE;  Surgeon: [name], MD;  Location: [facility];  Service: ENT;;  . REPAIR OF ESOPHAGUS  03/03/2015   Procedure: REPAIR OF ESOPHAGUS;  Surgeon: [name], MD;  Location: [facility];  Service: ENT;;   Social History   Occupational History  .      Extension Agent, [employer]   Social History Main Topics  . Smoking status: Never Smoker  . Smokeless tobacco: Never Used  . Alcohol use Yes     Comment: rarely  . Drug use: No  . Sexual activity: Yes    Birth control/ protection: Surgical

## 2016-06-24 NOTE — Patient Instructions (Signed)
Avoid overhead lifting and overhead use of the arms. Do not lift greater than 5 lbs. Adjust head rest in vehicle to prevent hyperextension if rear ended.   

## 2016-06-28 ENCOUNTER — Encounter: Payer: Self-pay | Admitting: Diagnostic Neuroimaging

## 2016-06-28 ENCOUNTER — Ambulatory Visit (INDEPENDENT_AMBULATORY_CARE_PROVIDER_SITE_OTHER): Payer: BC Managed Care – PPO | Admitting: Diagnostic Neuroimaging

## 2016-06-28 VITALS — BP 144/88 | HR 80 | Wt 218.0 lb

## 2016-06-28 DIAGNOSIS — R292 Abnormal reflex: Secondary | ICD-10-CM | POA: Diagnosis not present

## 2016-06-28 DIAGNOSIS — M5441 Lumbago with sciatica, right side: Secondary | ICD-10-CM | POA: Diagnosis not present

## 2016-06-28 DIAGNOSIS — G8929 Other chronic pain: Secondary | ICD-10-CM | POA: Diagnosis not present

## 2016-06-28 DIAGNOSIS — M5442 Lumbago with sciatica, left side: Secondary | ICD-10-CM

## 2016-06-28 DIAGNOSIS — M4802 Spinal stenosis, cervical region: Secondary | ICD-10-CM | POA: Diagnosis not present

## 2016-06-28 NOTE — Progress Notes (Signed)
[facility]  PATIENT: Carolyn [name] DOB: 22-Jun-1965  REFERRING CLINICIAN:  HISTORY FROM: patient  REASON FOR VISIT: follow up    HISTORICAL  CHIEF COMPLAINT:  Chief Complaint  Patient presents with  . Spinal stenosis in cervical region    rm 7, "I think I'm stable. I know how to limit my activities"  . Follow-up    3 month    HISTORY OF PRESENT ILLNESS:   UPDATE 06/28/16: Since last visit, doing about the same. Some minor falls, not seeing objects around her due to limited neck mobility. Ankle and foot symptoms are stable. Arms are stronger with PT and exercises.  UPDATE 03/29/16: Since last visit, had MRI c-spine and then went to 2nd neck surgery in Nov [id number], complicated by esophageal tear, pneumonia, and 10 day hospital stay. Now here at request by Dr. Donnetta Hutching for evaluation of bilateral ankle pain, weakness and numbness (right worse than left; worse with driving or sitting). Symptoms date back to sometime between 2014 and 2016, but worse in last few months. Also with ankle stiffness since 2003 (during eclampsia).   PRIOR HPI (01/13/15): 51 year old right-handed female here for evaluation of hyperreflexia and cervical spinal stenosis. Patient reports history of swallowing difficulties and neck stiffness problems for several years. She went to ENT, had x-ray of the cervical spine was found to have significant anterior flowing ossifications consistent with diffuse idiopathic skeletal hyperostosis. She was referred to orthopedic surgery, Dr. Louanne Skye, who performed surgical remover of anterior bone bridging and bone spurs. Immediately following surgery patient had significant improvement in range of motion of her neck as well as resolution of her swallowing difficulty. However immediately following surgery, she noticed progressively worsening numbness and tingling in her hands, weakness in her shoulders, stiffness and cramps in her ankles and lower extremities. Patient had  follow-up evaluations with orthopedic surgery, with repeat MRI studies showing cervical spinal stenosis at C3-4 level. Patient was also found to have hyper reflexia in the upper and lower extremities. Over the past 3 months patient has had increasing problems with bladder dysfunction, now having to wear pads every day. Also patient having significant depression and anxiety symptoms with associated sleep disturbance and insomnia. Patient referred to me for evaluation and neurologic opinion on whether patient's upper and lower extremity symptoms and hyperreflexia are related to cervical spinal stenosis or an alternate brain/upper motor neuron process.   REVIEW OF SYSTEMS: Full 14 system review of systems performed and negative except: as per HPI.    ALLERGIES: No Known Allergies  HOME MEDICATIONS: Outpatient Medications Prior to Visit  Medication Sig Dispense Refill  . cycloSPORINE (RESTASIS) 0.05 % ophthalmic emulsion Place 1 drop into both eyes 2 (two) times daily.    . Ibuprofen-Famotidine 800-26.6 MG TABS Take 1 tablet by mouth 2 (two) times daily after a meal. 60 tablet 3  . meloxicam (MOBIC) 15 MG tablet Take 15 mg by mouth as needed for pain.    [name] acetaminophen (TYLENOL) 500 MG tablet Take 1,000 mg by mouth every 8 (eight) hours as needed for mild pain or moderate pain.    [name] esomeprazole (NEXIUM) 20 MG capsule Take 20 mg by mouth daily as needed.     [name] LOSARTAN POTASSIUM-HCTZ PO Take by mouth daily.     No facility-administered medications prior to visit.     PAST MEDICAL HISTORY: Past Medical History:  Diagnosis Date  . Anxiety   . Arthritis    patient unsure  . Depression  but doesn't require any meds  . Diffuse idiopathic skeletal hyperostosis   . Dry eyes    uses Restasis bid  . GERD (gastroesophageal reflux disease)   . Headache(784.0)    MRI in 2007 bc of headaches  . Heart murmur    New diagnosis  . Hypertension    takes Hyzaar daily  . Neck pain    bone spur on  esophagus  . PONV (postoperative nausea and vomiting)    nausea and vomiting   . Urinary frequency     PAST SURGICAL HISTORY: Past Surgical History:  Procedure Laterality Date  . ABDOMINAL HYSTERECTOMY  2011   bleeding  . ANTERIOR CERVICAL DECOMP/DISCECTOMY FUSION N/A 08/21/2012   Procedure: Excision of exostosis C2-3,C3-4, C4-5;  Surgeon: Jessy Oto, MD;  Location: [place];  Service: Orthopedics;  Laterality: N/A;  . ANTERIOR CERVICAL DECOMP/DISCECTOMY FUSION N/A 03/03/2015   Procedure: ANTERIOR CERVICAL DISCECTOMY AND FUSION WITH PARTIAL VERTEBRECTOMY C3 AND C4, CERVICAL PLATE AND SCREWS, ALLOGRAFT, LOCAL BONE GRAFT, VIVIGEN;  Surgeon: Jessy Oto, MD;  Location: [place];  Service: Orthopedics;  Laterality: N/A;  . DIRECT LARYNGOSCOPY  03/03/2015   Procedure: DIRECT LARYNGOSCOPY AND PLACEMENT OF FEEDING TUBE;  Surgeon: [name], MD;  Location: [place];  Service: ENT;;  . REPAIR OF ESOPHAGUS  03/03/2015   Procedure: REPAIR OF ESOPHAGUS;  Surgeon: [name], MD;  Location: [place];  Service: ENT;;    FAMILY HISTORY: Family History  Problem Relation Age of Onset  . Heart murmur Father   . Hypertension Father   . Hypertension Mother   . Cancer Paternal Aunt     uterine  . Cancer Maternal Grandmother     lung  . Cancer Maternal Grandfather     stomach  . Cancer Paternal Grandmother     lung  . Hypertension      SOCIAL HISTORY:  Social History   Social History  . Marital status: Married    Spouse name: N/A  . Number of children: 3  . Years of education: N/A   Occupational History  .      Extension Agent, [employer]   Social History Main Topics  . Smoking status: Never Smoker  . Smokeless tobacco: Never Used  . Alcohol use Yes     Comment: rarely  . Drug use: No  . Sexual activity: Yes    Birth control/ protection: Surgical   Other Topics Concern  . Not on file   Social History Narrative   Married, lives with husband, daughter   Caffeine use - coffee 5  cups daily     PHYSICAL EXAM  GENERAL EXAM/CONSTITUTIONAL: Vitals:  Vitals:   06/28/16 1304  BP: (!) 144/88  Pulse: 80  Weight: 218 lb (98.9 kg)   Body mass index is 33.15 kg/m. No exam data present  Patient is in no distress; well developed, nourished and groomed; neck is supple  CARDIOVASCULAR:  Examination of carotid arteries is normal; no carotid bruits  Regular rate and rhythm, no murmurs  Examination of peripheral vascular system by observation and palpation is normal  EYES:  Ophthalmoscopic exam of optic discs and posterior segments is normal; no papilledema or hemorrhages  MUSCULOSKELETAL:  Gait, strength, tone, movements noted in Neurologic exam below  NEUROLOGIC: MENTAL STATUS:  No flowsheet data found.  awake, alert, oriented to person, place and time  recent and remote memory intact  normal attention and concentration  language fluent, comprehension intact, naming intact,   fund  of knowledge appropriate  CRANIAL NERVE:   2nd - no papilledema on fundoscopic exam  2nd, 3rd, 4th, 6th - pupils equal and reactive to light, visual fields full to confrontation, extraocular muscles intact, no nystagmus  5th - facial sensation symmetric  7th - facial strength symmetric  8th - hearing intact  9th - palate elevates symmetrically, uvula midline  11th - shoulder shrug symmetric  12th - tongue protrusion midline  MOTOR:   normal bulk; INCREASED TONE IN BUE; SLIGHT INCR TONE IN BLE  BUE 5; ABLE TO LIFT ARMS OVERHEAD  BLE (R HF 4, L HF 5; KE 4, KF 4, DF 5)  SENSORY:   normal and symmetric to light touch, temperature, vibration  COORDINATION:   finger-nose-finger, fine finger movements normal  REFLEXES:   deep tendon reflexes : BUE 2; KNEES 3; ANKLES 2; BILATERAL HOFFMANS POSITIVE  GAIT/STATION:   SLIGHTLY CAUTIOUS, STIFF GAIT; narrow based gait; able to walk on toes, heels and tandem; romberg is negative    DIAGNOSTIC DATA  (LABS, IMAGING, TESTING) - I reviewed patient records, labs, notes, testing and imaging myself where available.  Lab Results  Component Value Date   WBC 9.1 03/13/2015   HGB 9.6 (L) 03/13/2015   HCT 28.3 (L) 03/13/2015   MCV 85.8 03/13/2015   PLT 174 03/13/2015      Component Value Date/Time   NA 140 03/13/2015 0546   K 3.3 (L) 03/13/2015 0546   CL 106 03/13/2015 0546   CO2 25 03/13/2015 0546   GLUCOSE 111 (H) 03/13/2015 0546   BUN 9 03/13/2015 0546   CREATININE 1.30 (H) 03/13/2015 0546   CALCIUM 9.0 03/13/2015 0546   PROT 7.1 02/23/2015 0939   ALBUMIN 3.9 02/23/2015 0939   AST 19 02/23/2015 0939   ALT 21 02/23/2015 0939   ALKPHOS 58 02/23/2015 0939   BILITOT 0.8 02/23/2015 0939   GFRNONAA 47 (L) 03/13/2015 0546   GFRAA 55 (L) 03/13/2015 0546   No results found for: CHOL, HDL, LDLCALC, LDLDIRECT, TRIG, CHOLHDL Lab Results  Component Value Date   HGBA1C 5.4 03/29/2016   Lab Results  Component Value Date   [id number] 03/29/2016   No results found for: TSH   12/08/14 MRI cervical spine [I reviewed images myself and agree with interpretation. I would grade spinal stenosis at C3-4 as moderate. No cord signal abnormalities. -VRP]  1. At C4-5 there is a mild broad-based disc bulge with a focal left foraminal disc osteophyte complex resulting in severe left foraminal stenosis. 2. At C3-4 there is a broad central disc protrusion abutting the ventral cervical spinal cord. Bilateral uncovertebral degenerative changes.  02/04/15 MRI brain  Equivocal MRI brain (without) demonstrating: 1. Subtle left parietal subcortical focus of non-specific gliosis. 2. No acute findings.  02/04/15 MRI cervical spine 1. At C3-4: disc bulging and facet hypertrophy with moderate spinal stenosis and moderate-severe biforaminal stenosis; no cord signal changes. 2. At C4-5: disc bulging and facet hypertrophy with mild spinal stenosis and mild right and moderate left foraminal stenosis; no cord  signal changes. 3. Compared to MRI on 12/08/14, spinal stenosis at C3-4 has slightly increased.  04/06/16 MRI lumbar spine (without) [I reviewed images myself and agree with interpretation. -VRP] - At L3-4, L4-5: disc bulging and facet hypertrophy with mild biforaminal stenosis.  04/19/16 MRI cervical spine [I reviewed images myself and agree with interpretation. -VRP]  - Prior anterior plate and screw fixation and interbody fusion at C3-4. T2 hyperintense signal in the C3-4 disc  space raise possibility of incomplete fusion although there does appear to be osseous bridging between C3 and C4 vertebral bodies. - Moderate spinal canal stenosis at C2-3, C3-4 and C4-5 - Diffuse idiopathic skeletal hyperostosis     ASSESSMENT AND PLAN  51 y.o. year old female here with history of DISH (dx'd after dysphagia evaluation), with improvement in dyphagia after spur removal in 2014, but development and worsening of bilateral hand numbness, leg/foot stiffness post-operatively, with increasing bladder dysfunction in last 3 months. Exam notable for weakness in BUE and BLE; slight hyperreflexia in knees.   Overall I think patient's symptoms are likely related to her moderate cervical spinal stenosis at C3-4, as well as severe left foraminal stenosis at C4-5 and mild biforaminal stenosis at C5-6 especially given the timing of onset of symptoms following her surgery. This may have created some dynamic intermittent compression as queried by Dr. Louanne Skye himself. Now s/p cervical decompression and fusion.   Also with worsening right foot ankle stiffness / numbness / weakness (dating back to 2014), also with low back pain. More pain with flexion or extension of lumbar spine.   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  Greater than 50% of time was spent in counseling and coordination of care with patient. In summary we discussed:  - foot/ankle problem are related to cervical spine issues, and less likely lumbar spine issues - continue gentle exercises and fall precautions  Return if symptoms worsen or fail to improve, for return to PCP.    Penni Bombard, MD [id number], [id number] PM Certified in Neurology, Neurophysiology and Neuroimaging  [facility] [address] [place], [place] [address] [phone]

## 2016-09-08 ENCOUNTER — Ambulatory Visit (INDEPENDENT_AMBULATORY_CARE_PROVIDER_SITE_OTHER): Payer: BC Managed Care – PPO | Admitting: Family Medicine

## 2016-09-08 ENCOUNTER — Encounter: Payer: Self-pay | Admitting: Family Medicine

## 2016-09-08 VITALS — BP 150/80 | HR 62 | Temp 98.8°F | Ht 67.5 in | Wt 215.2 lb

## 2016-09-08 DIAGNOSIS — R5382 Chronic fatigue, unspecified: Secondary | ICD-10-CM

## 2016-09-08 DIAGNOSIS — N3941 Urge incontinence: Secondary | ICD-10-CM

## 2016-09-08 DIAGNOSIS — M4810 Ankylosing hyperostosis [Forestier], site unspecified: Secondary | ICD-10-CM

## 2016-09-08 DIAGNOSIS — I872 Venous insufficiency (chronic) (peripheral): Secondary | ICD-10-CM

## 2016-09-08 DIAGNOSIS — F3289 Other specified depressive episodes: Secondary | ICD-10-CM

## 2016-09-08 DIAGNOSIS — R011 Cardiac murmur, unspecified: Secondary | ICD-10-CM | POA: Diagnosis not present

## 2016-09-08 DIAGNOSIS — R5383 Other fatigue: Secondary | ICD-10-CM | POA: Insufficient documentation

## 2016-09-08 DIAGNOSIS — K219 Gastro-esophageal reflux disease without esophagitis: Secondary | ICD-10-CM

## 2016-09-08 DIAGNOSIS — I10 Essential (primary) hypertension: Secondary | ICD-10-CM | POA: Diagnosis not present

## 2016-09-08 DIAGNOSIS — R1319 Other dysphagia: Secondary | ICD-10-CM

## 2016-09-08 DIAGNOSIS — M4802 Spinal stenosis, cervical region: Secondary | ICD-10-CM | POA: Diagnosis not present

## 2016-09-08 MED ORDER — LOSARTAN POTASSIUM-HCTZ 100-25 MG PO TABS: 1.0000 | ORAL_TABLET | Freq: Every day | ORAL | 3 refills | Status: DC

## 2016-09-08 NOTE — Assessment & Plan Note (Signed)
This is followed by [facility] Dr Donnetta Hutching.

## 2016-09-08 NOTE — Progress Notes (Signed)
BP (!) 150/80 (BP Location: Right Arm, Cuff Size: Large)   Pulse 62   Temp 98.8 F (37.1 C) (Oral)   Ht 5' 7.5" (1.715 m)   Wt 215 lb 4 oz (97.6 kg)   SpO2 98%   BMI 33.22 kg/m    CC: new pt to establish care Subjective:    Patient ID: Carolyn Carolyn [name], female    DOB: 1965-07-23, 51 y.o.   MRN: [id number]  HPI: Carolyn Carolyn [name] is a 51 y.o. female presenting on 09/08/2016 for Establish Care   Prior saw Carolyn Covert PA - [facility] in [place]. Records requested today.   Known cervical spinal stenosis and DISH with esophageal impingement followed by neurology and orthopedics s/p 2 neck surgeries, second complicated by esophageal tear, PNA, 10d hospitalization. She completed physical therapy.   HTN - well controlled with ARB/HCTZ.   Chronic venous insufficiency followed by Carolyn Donnetta Hutching.   GERD - on famotidine and ibuprofen - states she gets this through palliative care set up by Carolyn Carolyn Carolyn [name].   Ongoing long standing chronic fatigue. + snores, some PNDyspnea. No witnessed apneic episodes. No daytime somnolence - more worn out.   Has seen Carolyn Carolyn [name] with EGD.   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 Interim medical history since our last visit reviewed. Allergies and medications reviewed and updated. Outpatient Medications Prior to Visit  Medication Sig Dispense Refill  . cycloSPORINE (RESTASIS) 0.05 % ophthalmic emulsion Place 1 drop into both eyes 2 (two) times daily.    . Ibuprofen-Famotidine 800-26.6 MG TABS Take 1 tablet by mouth 2 (two) times daily after a meal. 60 tablet 3  . losartan-hydrochlorothiazide (HYZAAR) 100-25 MG tablet Take 1 tablet by mouth daily. 100-25 mg    . meloxicam (MOBIC) 15 MG  tablet Take 15 mg by mouth as needed for pain.     No facility-administered medications prior to visit.      Per HPI unless specifically indicated in ROS section below Review of Systems     Objective:    BP (!) 150/80 (BP Location: Right Arm, Cuff Size: Large)   Pulse 62   Temp 98.8 F (37.1 C) (Oral)   Ht 5' 7.5" (1.715 m)   Wt 215 lb 4 oz (97.6 kg)   SpO2 98%   BMI 33.22 kg/m   Wt Readings from Last 3 Encounters:  09/08/16 215 lb 4 oz (97.6 kg)  06/28/16 218 lb (98.9 kg)  06/24/16 217 lb (98.4 kg)    Physical Exam  Constitutional: She appears well-developed and well-nourished. No distress.  HENT:  Head: Normocephalic and atraumatic.  Mouth/Throat: Oropharynx is clear and moist. No oropharyngeal exudate.  Eyes: Conjunctivae are normal. Pupils are equal, round, and reactive to light.  Neck: No thyromegaly present.  Cardiovascular: Normal rate, regular rhythm and intact distal pulses.   Murmur (faint systolic) heard. Pulmonary/Chest: Effort normal and breath sounds normal. No respiratory distress. She has no wheezes. She has no rales.  Musculoskeletal: She exhibits no edema.  Lymphadenopathy:    She has no cervical adenopathy.  Skin: Skin is warm and dry. No rash noted.  Psychiatric: She has a normal mood and affect.  Nursing note and vitals reviewed.  Results  for orders placed or performed in visit on 03/29/16  Vitamin B12  Result Value Ref Range   Vitamin B-12 763 232 - 1,245 pg/mL  Hemoglobin A1c  Result Value Ref Range   Hgb A1c MFr Bld 5.4 4.8 - 5.6 %   Est. average glucose Bld gHb Est-mCnc 108 mg/dL      Assessment & Plan:   Problem List Items Addressed This Visit      Chronic   Spinal stenosis in cervical region    Chronic issue, followed by neurology and orthopedics.         Diagnosis of   DISH (diffuse idiopathic skeletal hyperostosis)    Chronic condition s/p 2 spinal surgeries. Currently managed with duexis by ortho - pt states she gets this  through hospice/palliative care.        Other dysphagia (Chronic)     Other   Chronic fatigue    Ongoing over months. Will request records for latest Carolyn [name], then check labwork at CPE in 2-3 mo.  Symptoms not consistent with OSA.      Chronic venous insufficiency    This is followed by [facility] Carolyn Donnetta Hutching.       Relevant Medications   losartan-hydrochlorothiazide (HYZAAR) 100-25 MG tablet   Depression    Mild, off meds.      GERD (gastroesophageal reflux disease)    On duexis.       Heart murmur    Very mild on exam - will continue to monitor.       Hypertension - Primary    Chronic, mildly elevated today. Advised pt monitor at home, will reassess at CPE in 2-3 mo. Continue current regimen.       Relevant Medications   losartan-hydrochlorothiazide (HYZAAR) 100-25 MG tablet   Urge incontinence       Follow up plan: Return in about 3 months (around 12/09/2016) for annual exam, prior fasting for blood work.  Ria Bush, MD

## 2016-09-08 NOTE — Assessment & Plan Note (Signed)
Very mild on exam - will continue to monitor.

## 2016-09-08 NOTE — Assessment & Plan Note (Signed)
On duexis.

## 2016-09-08 NOTE — Assessment & Plan Note (Signed)
Mild, off meds.

## 2016-09-08 NOTE — Patient Instructions (Addendum)
Good to meet you today, call us with quesitons.  Return in 2-3 months for physical, fasting prior for labs.  I will request records from [place] your prior PCP.  Continue current medicines.  Blood pressure staying a bit elevated - watch at home and let me know if persistently >140/90. Decrease salt/sodium in diet. Increase water.

## 2016-09-08 NOTE — Assessment & Plan Note (Addendum)
Ongoing over months. Will request records for latest labs, then check labwork at CPE in 2-3 mo.  Symptoms not consistent with OSA.

## 2016-09-08 NOTE — Assessment & Plan Note (Signed)
Chronic issue, followed by neurology and orthopedics.

## 2016-09-08 NOTE — Assessment & Plan Note (Addendum)
Chronic condition s/p 2 spinal surgeries. Currently managed with duexis by ortho - pt states she gets this through hospice/palliative care.

## 2016-09-08 NOTE — Assessment & Plan Note (Addendum)
Chronic, mildly elevated today. Advised pt monitor at home, will reassess at CPE in 2-3 mo. Continue current regimen.

## 2016-09-22 IMAGING — CR DG CHEST 2V
2 series · 2 of 2 positions shown · non-contrast
Comparison: PA and lateral chest x-ray August 20, 2012

CLINICAL DATA: Cough and chest congestion, previous history of
esophageal tear

EXAM:
CHEST  2 VIEW

[chest pa]
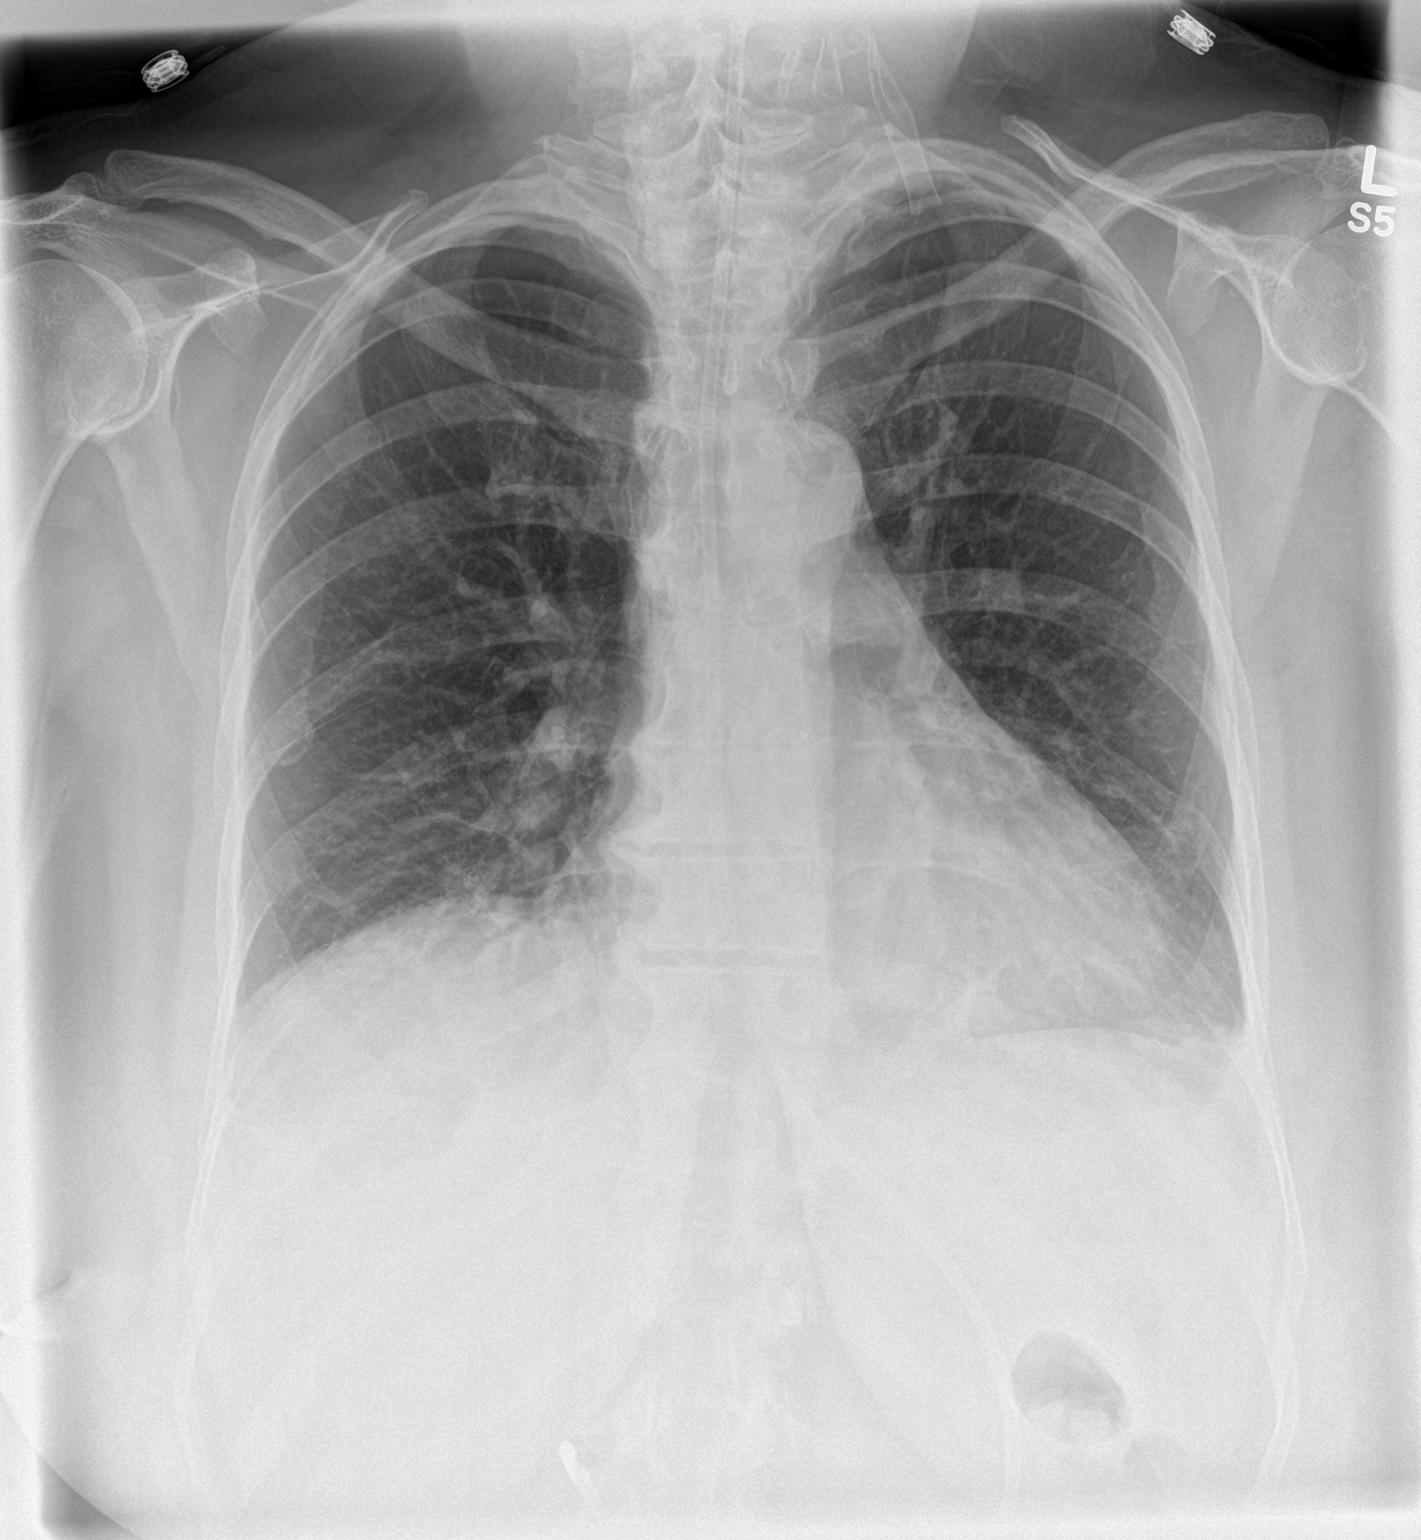

[chest lat]
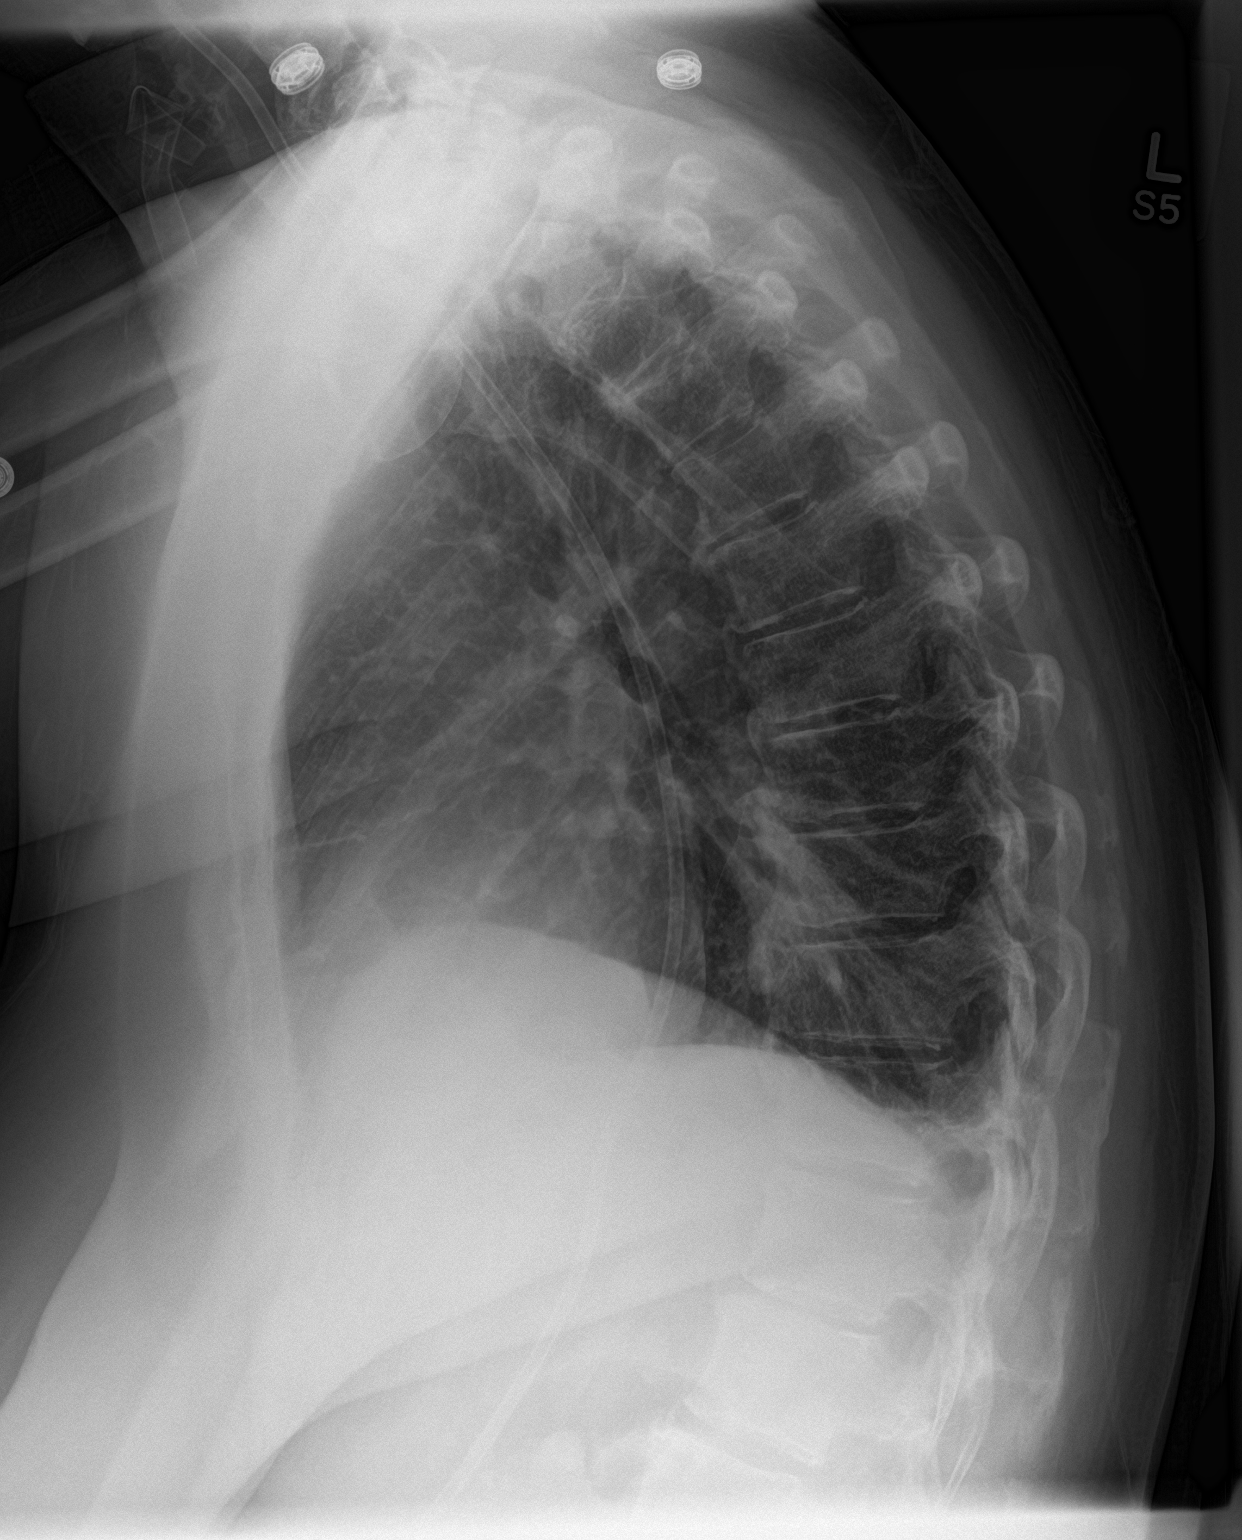

[2 of 2 positions shown; findings below may reference images not displayed]

FINDINGS: The lungs are adequately inflated. The interstitial markings are
mildly increased bilaterally. There is infiltrate in the left lower
lobe posterior medially. There is a trace of pleural fluid blunting
the left costophrenic angles. The cardiac silhouette is top-normal
in size. The pulmonary vascularity is not engorged. The mediastinum
is normal in width. A feeding tube is present with the tip lying in
the region of the pylorus. The bony thorax exhibits no acute
abnormality.
IMPRESSION: Left lower lobe pneumonia and trace left pleural effusion. Mild
pulmonary interstitial prominence bilaterally may reflect low-grade
interstitial edema or less likely interstitial pneumonia.

## 2016-09-23 IMAGING — DX DG CHEST 1V PORT
1 series · 1 of 1 positions shown · non-contrast
Comparison: 05/04/2014

CLINICAL DATA: Cough and congestion.  History of esophageal tear

EXAM:
PORTABLE CHEST 1 VIEW

[chest ap]
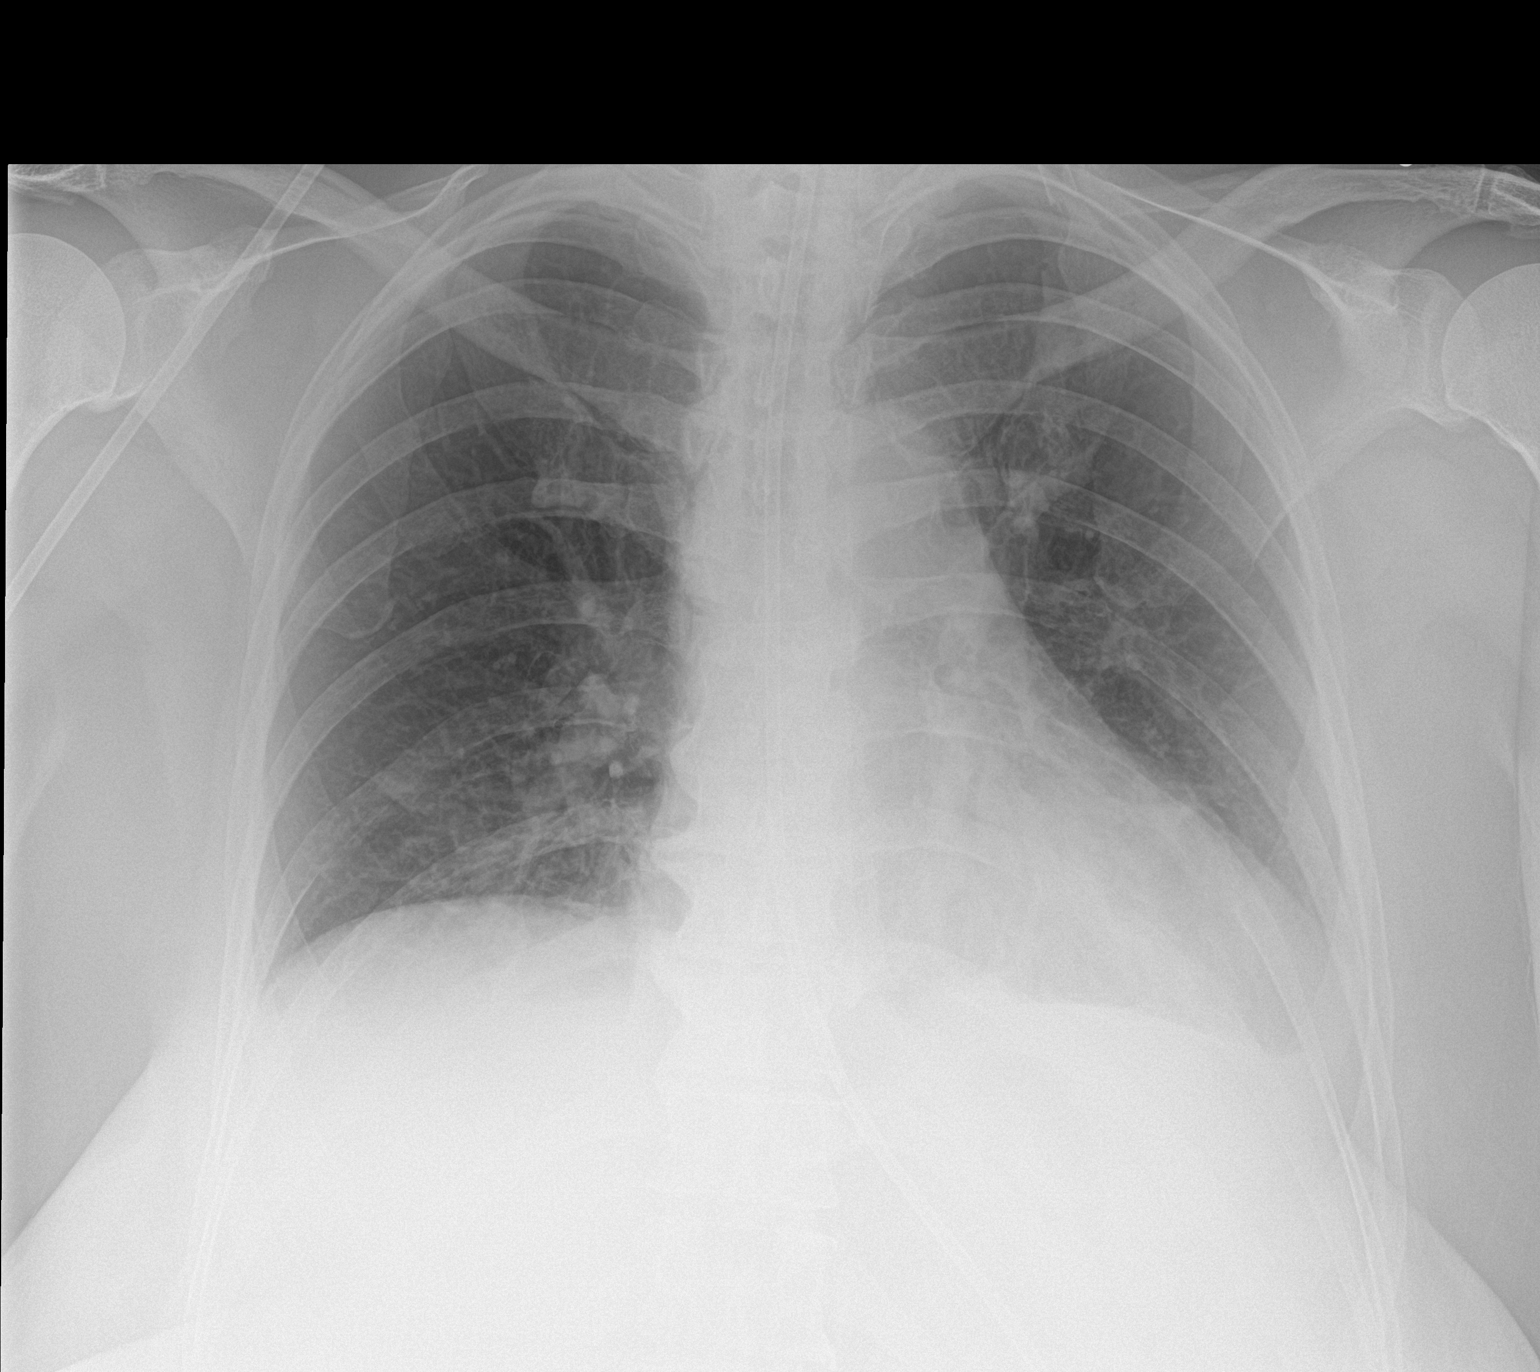

[1 of 1 positions shown; findings below may reference images not displayed]

FINDINGS: Stable cardiomegaly and upper mediastinal contours. Mild atelectatic
changes at the bases, subsegmental. Small left pleural effusion is
stable. Surgical drain in the left neck again noted. No soft tissue
gas or pneumothorax. Feeding tube at least reaches the stomach.
IMPRESSION: Stable mild basilar atelectasis and small left effusion.

## 2016-11-16 ENCOUNTER — Other Ambulatory Visit: Payer: Self-pay | Admitting: Family Medicine

## 2016-11-16 ENCOUNTER — Other Ambulatory Visit (INDEPENDENT_AMBULATORY_CARE_PROVIDER_SITE_OTHER): Payer: BC Managed Care – PPO

## 2016-11-16 DIAGNOSIS — I10 Essential (primary) hypertension: Secondary | ICD-10-CM

## 2016-11-16 DIAGNOSIS — D649 Anemia, unspecified: Secondary | ICD-10-CM

## 2016-11-16 DIAGNOSIS — N289 Disorder of kidney and ureter, unspecified: Secondary | ICD-10-CM

## 2016-11-16 LAB — CBC WITH DIFFERENTIAL/PLATELET
Basophils Absolute: 0 10*3/uL (ref 0.0–0.1)
Basophils Relative: 0.7 % (ref 0.0–3.0)
EOS PCT: 4.2 % (ref 0.0–5.0)
Eosinophils Absolute: 0.3 10*3/uL (ref 0.0–0.7)
HCT: 37.7 % (ref 36.0–46.0)
Hemoglobin: 12.8 g/dL (ref 12.0–15.0)
LYMPHS ABS: 1.8 10*3/uL (ref 0.7–4.0)
Lymphocytes Relative: 24.8 % (ref 12.0–46.0)
MCHC: 33.9 g/dL (ref 30.0–36.0)
MCV: 87.2 fl (ref 78.0–100.0)
MONOS PCT: 7.6 % (ref 3.0–12.0)
Monocytes Absolute: 0.5 10*3/uL (ref 0.1–1.0)
NEUTROS PCT: 62.7 % (ref 43.0–77.0)
Neutro Abs: 4.5 10*3/uL (ref 1.4–7.7)
Platelets: 227 10*3/uL (ref 150.0–400.0)
RBC: 4.32 Mil/uL (ref 3.87–5.11)
RDW: 13.6 % (ref 11.5–15.5)
WBC: 7.2 10*3/uL (ref 4.0–10.5)

## 2016-11-16 LAB — LIPID PANEL
Cholesterol: 111 mg/dL (ref 0–200)
HDL: 40.2 mg/dL (ref >39.00)
LDL Cholesterol: 55 mg/dL (ref 0–99)
NONHDL: 70.49
TRIGLYCERIDES: 75 mg/dL (ref 0.0–149.0)
Total CHOL/HDL Ratio: 3
VLDL: 15 mg/dL (ref 0.0–40.0)

## 2016-11-16 LAB — RENAL FUNCTION PANEL
Albumin: 4.1 g/dL (ref 3.5–5.2)
BUN: 19 mg/dL (ref 6–23)
CALCIUM: 9.5 mg/dL (ref 8.4–10.5)
CO2: 33 mEq/L — ABNORMAL HIGH (ref 19–32)
Chloride: 103 mEq/L (ref 96–112)
Creatinine, Ser: 0.76 mg/dL (ref 0.40–1.20)
GFR: 85.33 mL/min (ref >60.00)
GLUCOSE: 111 mg/dL — AB (ref 70–99)
POTASSIUM: 3.9 meq/L (ref 3.5–5.1)
Phosphorus: 2.7 mg/dL (ref 2.3–4.6)
Sodium: 139 mEq/L (ref 135–145)

## 2016-11-22 ENCOUNTER — Ambulatory Visit (INDEPENDENT_AMBULATORY_CARE_PROVIDER_SITE_OTHER): Payer: BC Managed Care – PPO | Admitting: Family Medicine

## 2016-11-22 ENCOUNTER — Encounter: Payer: Self-pay | Admitting: Family Medicine

## 2016-11-22 VITALS — BP 152/88 | HR 64 | Temp 98.8°F | Ht 67.5 in | Wt 212.4 lb

## 2016-11-22 DIAGNOSIS — Z23 Encounter for immunization: Secondary | ICD-10-CM | POA: Diagnosis not present

## 2016-11-22 DIAGNOSIS — Z0001 Encounter for general adult medical examination with abnormal findings: Secondary | ICD-10-CM | POA: Diagnosis not present

## 2016-11-22 DIAGNOSIS — M4810 Ankylosing hyperostosis [Forestier], site unspecified: Secondary | ICD-10-CM | POA: Diagnosis not present

## 2016-11-22 DIAGNOSIS — I10 Essential (primary) hypertension: Secondary | ICD-10-CM

## 2016-11-22 DIAGNOSIS — Z1211 Encounter for screening for malignant neoplasm of colon: Secondary | ICD-10-CM

## 2016-11-22 DIAGNOSIS — E669 Obesity, unspecified: Secondary | ICD-10-CM | POA: Diagnosis not present

## 2016-11-22 DIAGNOSIS — R011 Cardiac murmur, unspecified: Secondary | ICD-10-CM

## 2016-11-22 DIAGNOSIS — Z0000 Encounter for general adult medical examination without abnormal findings: Secondary | ICD-10-CM | POA: Insufficient documentation

## 2016-11-22 MED ORDER — AMLODIPINE BESYLATE 5 MG PO TABS: 5.0000 mg | ORAL_TABLET | Freq: Every day | ORAL | 1 refills | Status: DC

## 2016-11-22 NOTE — Assessment & Plan Note (Signed)
Chronic, again elevated today. Pt endorses elevated readings at home as well as at other doctor's offices - will add amlodipine 5mg  daily to current regimen, reviewed monitoring for pedal edema.

## 2016-11-22 NOTE — Assessment & Plan Note (Signed)
Chronic, stable. Continues ibuprofen/famotidine.

## 2016-11-22 NOTE — Assessment & Plan Note (Signed)
Preventative protocols reviewed and updated unless pt declined. Discussed healthy diet and lifestyle.  

## 2016-11-22 NOTE — Assessment & Plan Note (Signed)
Discussed healthy diet and lifestyle changes to affect sustainable weight loss  

## 2016-11-22 NOTE — Assessment & Plan Note (Signed)
Per patient, had echo done last year. No records available.

## 2016-11-22 NOTE — Progress Notes (Signed)
BP (!) 152/88 (BP Location: Right Arm, Cuff Size: Large)   Pulse 64   Temp 98.8 F (37.1 C) (Oral)   Ht 5' 7.5" (1.715 m)   Wt 212 lb 6.4 oz (96.3 kg)   SpO2 97%   BMI 32.78 kg/m    CC: CPE Subjective:    Patient ID: Carolyn [name], female    DOB: November 29, 1965, 51 y.o.   MRN: [id number]  HPI: Carolyn [name] is a 51 y.o. female presenting on 11/22/2016 for Annual Exam   HTN - bp at home 170/80.   Preventative: Colon cancer screening - discussed - would like referral for colonoscopy.  Well woman with OBGYN Dr Derenda Mis in [place] last 2016 - released from care. S/p hysterectomy, ovaries remain. Flu shot - doesn't receive Tdap - today zostavax 2017 shingrix - pt interested Seat belt use discussed Sunscreen use discussed. No changing moles on skin. Sees Dr Nevada Crane. 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 Interim medical history since our last visit reviewed. Allergies and medications reviewed and updated. Outpatient Medications Prior to Visit  Medication Sig Dispense Refill  . Ibuprofen-Famotidine 800-26.6 MG TABS Take 1 tablet by mouth 2 (two) times daily after a meal. 60 tablet 3  . losartan-hydrochlorothiazide (HYZAAR) 100-25 MG tablet Take 1 tablet by mouth daily. 100-25 mg 90 tablet 3  . cycloSPORINE (RESTASIS) 0.05 % ophthalmic emulsion Place 1 drop into both eyes 2 (two) times daily.     No facility-administered medications prior to visit.      Per HPI unless specifically indicated in ROS section below Review of Systems  Constitutional: Negative for activity change, appetite change, chills, fatigue, fever and  unexpected weight change.  HENT: Negative for hearing loss.   Eyes: Negative for visual disturbance.  Respiratory: Negative for cough, chest tightness, shortness of breath and wheezing.   Cardiovascular: Negative for chest pain, palpitations and leg swelling.  Gastrointestinal: Positive for constipation. Negative for abdominal distention, abdominal pain, blood in stool, diarrhea, nausea and vomiting.  Genitourinary: Negative for difficulty urinating and hematuria.  Musculoskeletal: Negative for arthralgias, myalgias and neck pain.  Skin: Negative for rash.  Neurological: Negative for dizziness, seizures, syncope and headaches.  Hematological: Negative for adenopathy. Does not bruise/bleed easily.  Psychiatric/Behavioral: Negative for dysphoric mood. The patient is not nervous/anxious.        Objective:    BP (!) 152/88 (BP Location: Right Arm, Cuff Size: Large)   Pulse 64   Temp 98.8 F (37.1 C) (Oral)   Ht 5' 7.5" (1.715 m)   Wt 212 lb 6.4 oz (96.3 kg)   SpO2 97%   BMI 32.78 kg/m   Wt Readings from Last 3 Encounters:  11/22/16 212 lb 6.4 oz (96.3 kg)  09/08/16 215 lb 4 oz (97.6 kg)  06/28/16 218 lb (98.9 kg)    Physical Exam  Constitutional: She is oriented to person, place, and time. She appears well-developed and well-nourished. No distress.  HENT:  Head: Normocephalic and atraumatic.  Right Ear: Hearing, tympanic membrane, external ear and ear canal normal.  Left Ear: Hearing, tympanic membrane, external ear and ear canal normal.  Nose: Nose normal.  Mouth/Throat:  Uvula is midline, oropharynx is clear and moist and mucous membranes are normal. No oropharyngeal exudate, posterior oropharyngeal edema or posterior oropharyngeal erythema.  Eyes: Pupils are equal, round, and reactive to light. Conjunctivae and EOM are normal. No scleral icterus.  Neck: Normal range of motion. Neck supple. No thyromegaly present.  Cardiovascular: Normal rate, regular rhythm and intact distal  pulses.   Murmur (3/6 SEM) heard. Pulses:      Radial pulses are 2+ on the right side, and 2+ on the left side.  Pulmonary/Chest: Effort normal and breath sounds normal. No respiratory distress. She has no wheezes. She has no rales.  Abdominal: Soft. Bowel sounds are normal. She exhibits no distension and no mass. There is no tenderness. There is no rebound and no guarding.  Musculoskeletal: Normal range of motion. She exhibits no edema.  Lymphadenopathy:    She has no cervical adenopathy.  Neurological: She is alert and oriented to person, place, and time.  CN grossly intact, station and gait intact  Skin: Skin is warm and dry. No rash noted.  Psychiatric: She has a normal mood and affect. Her behavior is normal. Judgment and thought content normal.  Nursing note and vitals reviewed.  Results for orders placed or performed in visit on 11/16/16  Lipid panel  Result Value Ref Range   Cholesterol 111 0 - 200 mg/dL   Triglycerides 75.0 0.0 - 149.0 mg/dL   HDL 40.20 >39.00 mg/dL   VLDL 15.0 0.0 - 40.0 mg/dL   LDL Cholesterol 55 0 - 99 mg/dL   Total CHOL/HDL Ratio 3    NonHDL 70.49   Renal function panel  Result Value Ref Range   Sodium 139 135 - 145 mEq/L   Potassium 3.9 3.5 - 5.1 mEq/L   Chloride 103 96 - 112 mEq/L   CO2 33 (H) 19 - 32 mEq/L   Calcium 9.5 8.4 - 10.5 mg/dL   Albumin 4.1 3.5 - 5.2 g/dL   BUN 19 6 - 23 mg/dL   Creatinine, Ser 0.76 0.40 - 1.20 mg/dL   Glucose, Bld 111 (H) 70 - 99 mg/dL   Phosphorus 2.7 2.3 - 4.6 mg/dL   GFR 85.33 >60.00 mL/min  CBC with Differential/Platelet  Result Value Ref Range   WBC 7.2 4.0 - 10.5 K/uL   RBC 4.32 3.87 - 5.11 Mil/uL   Hemoglobin 12.8 12.0 - 15.0 g/dL   HCT 37.7 36.0 - 46.0 %   MCV 87.2 78.0 - 100.0 fl   MCHC 33.9 30.0 - 36.0 g/dL   RDW 13.6 11.5 - 15.5 %   Platelets 227.0 150.0 - 400.0 K/uL   Neutrophils Relative % 62.7 43.0 - 77.0 %   Lymphocytes Relative 24.8 12.0 - 46.0 %   Monocytes Relative 7.6 3.0 - 12.0 %    Eosinophils Relative 4.2 0.0 - 5.0 %   Basophils Relative 0.7 0.0 - 3.0 %   Neutro Abs 4.5 1.4 - 7.7 K/uL   Lymphs Abs 1.8 0.7 - 4.0 K/uL   Monocytes Absolute 0.5 0.1 - 1.0 K/uL   Eosinophils Absolute 0.3 0.0 - 0.7 K/uL   Basophils Absolute 0.0 0.0 - 0.1 K/uL  No results found for: TSH     Assessment & Plan:   Problem List Items Addressed This Visit      Diagnosis of   DISH (diffuse idiopathic skeletal hyperostosis)    Chronic, stable. Continues ibuprofen/famotidine.        Other   Health maintenance examination - Primary  Preventative protocols reviewed and updated unless pt declined. Discussed healthy diet and lifestyle.       Relevant Orders   Tdap vaccine greater than or equal to 7yo IM (Completed)   Heart murmur    Per patient, had echo done last year. No records available.       Hypertension    Chronic, again elevated today. Pt endorses elevated readings at home as well as at other doctor's offices - will add amlodipine 5mg  daily to current regimen, reviewed monitoring for pedal edema.       Relevant Medications   amLODipine (NORVASC) 5 MG tablet   Obesity, Class I, BMI 30-34.9    Discussed healthy diet and lifestyle changes to affect sustainable weight loss.        Other Visit Diagnoses    Special screening for malignant neoplasms, colon       Relevant Orders   Ambulatory referral to Gastroenterology       Follow up plan: Return in about 4 months (around 03/24/2017) for follow up visit.  Ria Bush, MD

## 2016-11-22 NOTE — Patient Instructions (Addendum)
Tdap today.  You are doing well today but blood pressure is staying elevated. Add amlodipine 58m daily. Watch for ankle swelling on this medicine.  Return in 4-6 months for follow up visit.   Health Maintenance, Female Adopting a healthy lifestyle and getting preventive care can go a long way to promote health and wellness. Talk with your health care provider about what schedule of regular examinations is right for you. This is a good chance for you to check in with your provider about disease prevention and staying healthy. In between checkups, there are plenty of things you can do on your own. Experts have done a lot of research about which lifestyle changes and preventive measures are most likely to keep you healthy. Ask your health care provider for more information. Weight and diet Eat a healthy diet  Be sure to include plenty of vegetables, fruits, low-fat dairy products, and lean protein.  Do not eat a lot of foods high in solid fats, added sugars, or salt.  Get regular exercise. This is one of the most important things you can do for your health. ? Most adults should exercise for at least 150 minutes each week. The exercise should increase your heart rate and make you sweat (moderate-intensity exercise). ? Most adults should also do strengthening exercises at least twice a week. This is in addition to the moderate-intensity exercise.  Maintain a healthy weight  Body mass index (BMI) is a measurement that can be used to identify possible weight problems. It estimates body fat based on height and weight. Your health care provider can help determine your BMI and help you achieve or maintain a healthy weight.  For females 293years of age and older: ? A BMI below 18.5 is considered underweight. ? A BMI of 18.5 to 24.9 is normal. ? A BMI of 25 to 29.9 is considered overweight. ? A BMI of 30 and above is considered obese.  Watch levels of cholesterol and blood lipids  You should start  having your blood tested for lipids and cholesterol at 51years of age, then have this test every 5 years.  You may need to have your cholesterol levels checked more often if: ? Your lipid or cholesterol levels are high. ? You are older than 51years of age. ? You are at high risk for heart disease.  Cancer screening Lung Cancer  Lung cancer screening is recommended for adults [id number] old who are at high risk for lung cancer because of a history of smoking.  A yearly low-dose CT scan of the lungs is recommended for people who: ? Currently smoke. ? Have quit within the past 15 years. ? Have at least a 30-pack-year history of smoking. A pack year is smoking an average of one pack of cigarettes a day for 1 year.  Yearly screening should continue until it has been 15 years since you quit.  Yearly screening should stop if you develop a health problem that would prevent you from having lung cancer treatment.  Breast Cancer  Practice breast self-awareness. This means understanding how your breasts normally appear and feel.  It also means doing regular breast self-exams. Let your health care provider know about any changes, no matter how small.  If you are in your 20s or 30s, you should have a clinical breast exam (CBE) by a health care provider every 1-3 years as part of a regular health exam.  If you are 468or older, have a CBE every  year. Also consider having a breast X-ray (mammogram) every year.  If you have a family history of breast cancer, talk to your health care provider about genetic screening.  If you are at high risk for breast cancer, talk to your health care provider about having an MRI and a mammogram every year.  Breast cancer gene (BRCA) assessment is recommended for women who have family members with BRCA-related cancers. BRCA-related cancers include: ? Breast. ? Ovarian. ? Tubal. ? Peritoneal cancers.  Results of the assessment will determine the need for  genetic counseling and BRCA1 and BRCA2 testing.  Cervical Cancer Your health care provider may recommend that you be screened regularly for cancer of the pelvic organs (ovaries, uterus, and vagina). This screening involves a pelvic examination, including checking for microscopic changes to the surface of your cervix (Pap test). You may be encouraged to have this screening done every 3 years, beginning at age 65.  For women ages 23-65, health care providers may recommend pelvic exams and Pap testing every 3 years, or they may recommend the Pap and pelvic exam, combined with testing for human papilloma virus (HPV), every 5 years. Some types of HPV increase your risk of cervical cancer. Testing for HPV may also be done on women of any age with unclear Pap test results.  Other health care providers may not recommend any screening for nonpregnant women who are considered low risk for pelvic cancer and who do not have symptoms. Ask your health care provider if a screening pelvic exam is right for you.  If you have had past treatment for cervical cancer or a condition that could lead to cancer, you need Pap tests and screening for cancer for at least 20 years after your treatment. If Pap tests have been discontinued, your risk factors (such as having a new sexual partner) need to be reassessed to determine if screening should resume. Some women have medical problems that increase the chance of getting cervical cancer. In these cases, your health care provider may recommend more frequent screening and Pap tests.  Colorectal Cancer  This type of cancer can be detected and often prevented.  Routine colorectal cancer screening usually begins at 51 years of age and continues through 51 years of age.  Your health care provider may recommend screening at an earlier age if you have risk factors for colon cancer.  Your health care provider may also recommend using home test kits to check for hidden blood in the  stool.  A small camera at the end of a tube can be used to examine your colon directly (sigmoidoscopy or colonoscopy). This is done to check for the earliest forms of colorectal cancer.  Routine screening usually begins at age 42.  Direct examination of the colon should be repeated every 5-10 years through 51 years of age. However, you may need to be screened more often if early forms of precancerous polyps or small growths are found.  Skin Cancer  Check your skin from head to toe regularly.  Tell your health care provider about any new moles or changes in moles, especially if there is a change in a mole's shape or color.  Also tell your health care provider if you have a mole that is larger than the size of a pencil eraser.  Always use sunscreen. Apply sunscreen liberally and repeatedly throughout the day.  Protect yourself by wearing long sleeves, pants, a wide-brimmed hat, and sunglasses whenever you are outside.  Heart disease, diabetes, and  high blood pressure  High blood pressure causes heart disease and increases the risk of stroke. High blood pressure is more likely to develop in: ? People who have blood pressure in the high end of the normal range (130-139/85-89 mm Hg). ? People who are overweight or obese. ? People who are African American.  If you are 42-20 years of age, have your blood pressure checked every 3-5 years. If you are 77 years of age or older, have your blood pressure checked every year. You should have your blood pressure measured twice-once when you are at a hospital or clinic, and once when you are not at a hospital or clinic. Record the average of the two measurements. To check your blood pressure when you are not at a hospital or clinic, you can use: ? An automated blood pressure machine at a pharmacy. ? A home blood pressure monitor.  If you are between 19 years and 65 years old, ask your health care provider if you should take aspirin to prevent  strokes.  Have regular diabetes screenings. This involves taking a blood sample to check your fasting blood sugar level. ? If you are at a normal weight and have a low risk for diabetes, have this test once every three years after 51 years of age. ? If you are overweight and have a high risk for diabetes, consider being tested at a younger age or more often. Preventing infection Hepatitis B  If you have a higher risk for hepatitis B, you should be screened for this virus. You are considered at high risk for hepatitis B if: ? You were born in a country where hepatitis B is common. Ask your health care provider which countries are considered high risk. ? Your parents were born in a high-risk country, and you have not been immunized against hepatitis B (hepatitis B vaccine). ? You have HIV or AIDS. ? You use needles to inject street drugs. ? You live with someone who has hepatitis B. ? You have had sex with someone who has hepatitis B. ? You get hemodialysis treatment. ? You take certain medicines for conditions, including cancer, organ transplantation, and autoimmune conditions.  Hepatitis C  Blood testing is recommended for: ? Everyone born from [birth year] through 1965. ? Anyone with known risk factors for hepatitis C.  Sexually transmitted infections (STIs)  You should be screened for sexually transmitted infections (STIs) including gonorrhea and chlamydia if: ? You are sexually active and are younger than 51 years of age. ? You are older than 51 years of age and your health care provider tells you that you are at risk for this type of infection. ? Your sexual activity has changed since you were last screened and you are at an increased risk for chlamydia or gonorrhea. Ask your health care provider if you are at risk.  If you do not have HIV, but are at risk, it may be recommended that you take a prescription medicine daily to prevent HIV infection. This is called pre-exposure prophylaxis  (PrEP). You are considered at risk if: ? You are sexually active and do not regularly use condoms or know the HIV status of your partner(s). ? You take drugs by injection. ? You are sexually active with a partner who has HIV.  Talk with your health care provider about whether you are at high risk of being infected with HIV. If you choose to begin PrEP, you should first be tested for HIV. You should then be  tested every 3 months for as long as you are taking PrEP. Pregnancy  If you are premenopausal and you may become pregnant, ask your health care provider about preconception counseling.  If you may become pregnant, take 400 to 800 micrograms (mcg) of folic acid every day.  If you want to prevent pregnancy, talk to your health care provider about birth control (contraception). Osteoporosis and menopause  Osteoporosis is a disease in which the bones lose minerals and strength with aging. This can result in serious bone fractures. Your risk for osteoporosis can be identified using a bone density scan.  If you are 65 years of age or older, or if you are at risk for osteoporosis and fractures, ask your health care provider if you should be screened.  Ask your health care provider whether you should take a calcium or vitamin D supplement to lower your risk for osteoporosis.  Menopause may have certain physical symptoms and risks.  Hormone replacement therapy may reduce some of these symptoms and risks. Talk to your health care provider about whether hormone replacement therapy is right for you. Follow these instructions at home:  Schedule regular health, dental, and eye exams.  Stay current with your immunizations.  Do not use any tobacco products including cigarettes, chewing tobacco, or electronic cigarettes.  If you are pregnant, do not drink alcohol.  If you are breastfeeding, limit how much and how often you drink alcohol.  Limit alcohol intake to no more than 1 drink per day for  nonpregnant women. One drink equals 12 ounces of beer, 5 ounces of wine, or 1 ounces of hard liquor.  Do not use street drugs.  Do not share needles.  Ask your health care provider for help if you need support or information about quitting drugs.  Tell your health care provider if you often feel depressed.  Tell your health care provider if you have ever been abused or do not feel safe at home. This information is not intended to replace advice given to you by your health care provider. Make sure you discuss any questions you have with your health care provider. Document Released: 10/25/2010 Document Revised: 09/17/2015 Document Reviewed: 01/13/2015 Elsevier Interactive Patient Education  2018 [employer]  

## 2016-11-25 ENCOUNTER — Encounter: Payer: Self-pay | Admitting: Gastroenterology

## 2016-12-03 ENCOUNTER — Encounter: Payer: Self-pay | Admitting: Family Medicine

## 2016-12-24 HISTORY — PX: COLONOSCOPY: SHX174

## 2017-01-04 ENCOUNTER — Ambulatory Visit (AMBULATORY_SURGERY_CENTER): Payer: Self-pay

## 2017-01-04 VITALS — Ht 68.0 in | Wt 212.2 lb

## 2017-01-04 DIAGNOSIS — Z8371 Family history of colonic polyps: Secondary | ICD-10-CM

## 2017-01-04 MED ORDER — NA SULFATE-K SULFATE-MG SULF 17.5-3.13-1.6 GM/177ML PO SOLN: ORAL | 0 refills | Status: DC

## 2017-01-04 NOTE — Progress Notes (Signed)
Per pt, no allergies to soy or egg products.Pt not taking any weight loss meds or using  O2 at home.   Pt refused Emmi video. 

## 2017-01-05 ENCOUNTER — Encounter: Payer: Self-pay | Admitting: Gastroenterology

## 2017-01-18 ENCOUNTER — Encounter: Payer: Self-pay | Admitting: Gastroenterology

## 2017-01-18 ENCOUNTER — Ambulatory Visit (AMBULATORY_SURGERY_CENTER): Payer: BC Managed Care – PPO | Admitting: Gastroenterology

## 2017-01-18 VITALS — BP 121/77 | HR 66 | Temp 97.8°F | Resp 19 | Ht 68.0 in | Wt 212.0 lb

## 2017-01-18 DIAGNOSIS — D122 Benign neoplasm of ascending colon: Secondary | ICD-10-CM

## 2017-01-18 DIAGNOSIS — Z1211 Encounter for screening for malignant neoplasm of colon: Secondary | ICD-10-CM

## 2017-01-18 MED ORDER — SODIUM CHLORIDE 0.9 % IV SOLN: 500.0000 mL | INTRAVENOUS | Status: DC

## 2017-01-18 NOTE — Progress Notes (Signed)
Pt's states no medical or surgical changes since previsit or office visit. 

## 2017-01-18 NOTE — Op Note (Signed)
[facility] Patient Name: Carolyn [name] Procedure Date: 01/18/2017 9:51 AM MRN: [id number] Endoscopist: Mauri Pole , MD Age: 51 Referring MD:  Date of Birth: [birth year] Gender: Female Account #: [id number] Procedure:                Colonoscopy Indications:              Screening for colorectal malignant neoplasm, This                            is the patient's first colonoscopy Medicines:                Monitored Anesthesia Care Procedure:                Pre-Anesthesia Assessment:                           - Prior to the procedure, a History and Physical                            was performed, and patient medications and                            allergies were reviewed. The patient's tolerance of                            previous anesthesia was also reviewed. The risks                            and benefits of the procedure and the sedation                            options and risks were discussed with the patient.                            All questions were answered, and informed consent                            was obtained. Prior Anticoagulants: The patient has                            taken no previous anticoagulant or antiplatelet                            agents. ASA Grade Assessment: II - A patient with                            mild systemic disease. After reviewing the risks                            and benefits, the patient was deemed in                            satisfactory condition to undergo the procedure.  After obtaining informed consent, the colonoscope                            was passed under direct vision. Throughout the                            procedure, the patient's blood pressure, pulse, and                            oxygen saturations were monitored continuously. The                            Model CF-HQ190L [id number]) scope was introduced                            through the anus  and advanced to the the cecum,                            identified by appendiceal orifice and ileocecal                            valve. The colonoscopy was performed without                            difficulty. The patient tolerated the procedure                            well. The quality of the bowel preparation was                            good. The terminal ileum, ileocecal valve,                            appendiceal orifice, and rectum were photographed. Scope In: 9:53:01 AM Scope Out: 10:05:46 AM Scope Withdrawal Time: 0 hours 7 minutes 48 seconds  Total Procedure Duration: 0 hours 12 minutes 45 seconds  Findings:                 The perianal and digital rectal examinations were                            normal.                           A 8 mm polyp was found in the ascending colon. The                            polyp was sessile. The polyp was removed with a                            cold snare. Resection and retrieval were complete.                           Non-bleeding internal hemorrhoids were found during  retroflexion. The hemorrhoids were small.                           The exam was otherwise without abnormality. Complications:            No immediate complications. Estimated Blood Loss:     Estimated blood loss was minimal. Impression:               - One 8 mm polyp in the ascending colon, removed                            with a cold snare. Resected and retrieved.                           - Non-bleeding internal hemorrhoids.                           - The examination was otherwise normal. Recommendation:           - Patient has a contact number available for                            emergencies. The signs and symptoms of potential                            delayed complications were discussed with the                            patient. Return to normal activities tomorrow.                            Written discharge  instructions were provided to the                            patient.                           - Resume previous diet.                           - Continue present medications.                           - Await pathology results.                           - Repeat colonoscopy in 5-10 years for surveillance                            based on pathology results. Mauri Pole, MD 01/18/2017 10:13:35 AM This report has been signed electronically.

## 2017-01-18 NOTE — Patient Instructions (Signed)
Discharge instructions given. Handouts on polyps and hemorrhoids. Resume previous medications. YOU HAD AN ENDOSCOPIC PROCEDURE TODAY AT THE [facility]:   Refer to the procedure report that was given to you for any specific questions about what was found during the examination.  If the procedure report does not answer your questions, please call your gastroenterologist to clarify.  If you requested that your care partner not be given the details of your procedure findings, then the procedure report has been included in a sealed envelope for you to review at your convenience later.  YOU SHOULD EXPECT: Some feelings of bloating in the abdomen. Passage of more gas than usual.  Walking can help get rid of the air that was put into your GI tract during the procedure and reduce the bloating. If you had a lower endoscopy (such as a colonoscopy or flexible sigmoidoscopy) you may notice spotting of blood in your stool or on the toilet paper. If you underwent a bowel prep for your procedure, you may not have a normal bowel movement for a few days.  Please Note:  You might notice some irritation and congestion in your nose or some drainage.  This is from the oxygen used during your procedure.  There is no need for concern and it should clear up in a day or so.  SYMPTOMS TO REPORT IMMEDIATELY:   Following lower endoscopy (colonoscopy or flexible sigmoidoscopy):  Excessive amounts of blood in the stool  Significant tenderness or worsening of abdominal pains  Swelling of the abdomen that is new, acute  Fever of 100F or higher   For urgent or emergent issues, a gastroenterologist can be reached at any hour by calling [phone].   DIET:  We do recommend a small meal at first, but then you may proceed to your regular diet.  Drink plenty of fluids but you should avoid alcoholic beverages for 24 hours.  ACTIVITY:  You should plan to take it easy for the rest of today and you should NOT DRIVE  or use heavy machinery until tomorrow (because of the sedation medicines used during the test).    FOLLOW UP: Our staff will call the number listed on your records the next business day following your procedure to check on you and address any questions or concerns that you may have regarding the information given to you following your procedure. If we do not reach you, we will leave a message.  However, if you are feeling well and you are not experiencing any problems, there is no need to return our call.  We will assume that you have returned to your regular daily activities without incident.  If any biopsies were taken you will be contacted by phone or by letter within the next 1-3 weeks.  Please call us at [phone] if you have not heard about the biopsies in 3 weeks.    SIGNATURES/CONFIDENTIALITY: You and/or your care partner have signed paperwork which will be entered into your electronic medical record.  These signatures attest to the fact that that the information above on your After Visit Summary has been reviewed and is understood.  Full responsibility of the confidentiality of this discharge information lies with you and/or your care-partner. 

## 2017-01-18 NOTE — Progress Notes (Signed)
Report to PACU, RN, vss, BBS= Clear.  

## 2017-01-18 NOTE — Progress Notes (Signed)
Called to room to assist during endoscopic procedure.  Patient ID and intended procedure confirmed with present staff. Received instructions for my participation in the procedure from the performing physician.  

## 2017-01-19 ENCOUNTER — Telehealth: Payer: Self-pay | Admitting: *Deleted

## 2017-01-19 NOTE — Telephone Encounter (Signed)
Unable to reach pt. At home.  No voice mail set up.  Unable to reach pt. At work phone

## 2017-01-23 ENCOUNTER — Encounter: Payer: Self-pay | Admitting: Gastroenterology

## 2017-01-29 ENCOUNTER — Encounter: Payer: Self-pay | Admitting: Family Medicine

## 2017-03-24 ENCOUNTER — Encounter: Payer: Self-pay | Admitting: Family Medicine

## 2017-03-24 ENCOUNTER — Ambulatory Visit: Payer: BC Managed Care – PPO | Admitting: Family Medicine

## 2017-03-24 VITALS — BP 120/80 | HR 76 | Temp 97.8°F | Wt 210.0 lb

## 2017-03-24 DIAGNOSIS — I10 Essential (primary) hypertension: Secondary | ICD-10-CM | POA: Diagnosis not present

## 2017-03-24 DIAGNOSIS — R1319 Other dysphagia: Secondary | ICD-10-CM | POA: Diagnosis not present

## 2017-03-24 MED ORDER — AMLODIPINE BESYLATE 5 MG PO TABS: 5.0000 mg | ORAL_TABLET | Freq: Every day | ORAL | 3 refills | Status: DC

## 2017-03-24 NOTE — Patient Instructions (Addendum)
Shop around different pharmacies for different amlodipine formulation.  Blood pressures are doing great!  Return as needed or in 8 months for next physical.

## 2017-03-24 NOTE — Assessment & Plan Note (Signed)
Chronic, improved on amlodipine. Continue. Trouble swallowing - reviewed formulations.  I asked she check at [facility] aid to see if they can switch formulations. If not, provided with printed Rx to take to different pharmacies searching for round tablet formulation.

## 2017-03-24 NOTE — Assessment & Plan Note (Signed)
Chronic since cervical surgeries, h/o esophageal tear.

## 2017-03-24 NOTE — Progress Notes (Addendum)
BP 120/80 (BP Location: Left Arm, Patient Position: Sitting, Cuff Size: Normal)   Pulse 76   Temp 97.8 F (36.6 C) (Oral)   Wt 210 lb (95.3 kg)   SpO2 98%   BMI 31.93 kg/m    CC: 4 mo f/u visit Subjective:    Patient ID: Carolyn [name], female    DOB: Sep 24, 1965, 51 y.o.   MRN: [id number]  HPI: Carolyn [name] is a 51 y.o. female presenting on 03/24/2017 for 4 mo follow-up (Has questions about amlodipine)   HTN - Compliant with current antihypertensive regimen of amlodipine 5mg  daily and hyzaar 100/25mg  daily. Does check blood pressures at home: well controlled.  No low blood pressure readings or symptoms of dizziness/syncope. No pedal edema, no flushing.  Denies HA, vision changes, CP/tightness, SOB, leg swelling.    She is having trouble swallowing amlodipine as it is a very light pill. Even trouble when taken with food or liquids.  Chronic trouble swallowing soft foods, and now amlodipine pill, after complication of esophageal tear during second cervical neck surgery.   Relevant past medical, surgical, family and social history reviewed and updated as indicated. Interim medical history since our last visit reviewed. Allergies and medications reviewed and updated. Outpatient Medications Prior to Visit  Medication Sig Dispense Refill  . cycloSPORINE (RESTASIS) 0.05 % ophthalmic emulsion Place 1 drop into both eyes 2 (two) times daily.    . Ibuprofen-Famotidine 800-26.6 MG TABS Take 1 tablet by mouth 2 (two) times daily after a meal. 60 tablet 3  . losartan-hydrochlorothiazide (HYZAAR) 100-25 MG tablet Take 1 tablet by mouth daily. 100-25 mg 90 tablet 3  . Psyllium (METAMUCIL FIBER PO) Take by mouth as needed.    [name] amLODipine (NORVASC) 5 MG tablet Take 1 tablet (5 mg total) by mouth daily. 90 tablet 1   Facility-Administered Medications Prior to Visit  Medication Dose Route Frequency Provider Last Rate Last Dose  . 0.9 %  sodium chloride infusion  500 mL Intravenous Continuous  Nandigam, Kavitha V, MD         Per HPI unless specifically indicated in ROS section below Review of Systems     Objective:    BP 120/80 (BP Location: Left Arm, Patient Position: Sitting, Cuff Size: Normal)   Pulse 76   Temp 97.8 F (36.6 C) (Oral)   Wt 210 lb (95.3 kg)   SpO2 98%   BMI 31.93 kg/m   Wt Readings from Last 3 Encounters:  03/24/17 210 lb (95.3 kg)  01/18/17 212 lb (96.2 kg)  01/04/17 212 lb 3.2 oz (96.3 kg)    Physical Exam  Constitutional: She appears well-developed and well-nourished. No distress.  HENT:  Mouth/Throat: Oropharynx is clear and moist. No oropharyngeal exudate.  Cardiovascular: Normal rate, regular rhythm and intact distal pulses.  Murmur (2/6 systolic) heard. Pulmonary/Chest: Effort normal and breath sounds normal. No respiratory distress. She has no wheezes. She has no rales.  Musculoskeletal: She exhibits no edema.  Psychiatric: She has a normal mood and affect.  Nursing note and vitals reviewed.     Assessment & Plan:   Problem List Items Addressed This Visit      Diagnosis of   Other dysphagia (Chronic)    Chronic since cervical surgeries, h/o esophageal tear.         Other   Hypertension - Primary    Chronic, improved on amlodipine. Continue. Trouble swallowing - reviewed formulations.  I asked she check at [facility] aid to see if  they can switch formulations. If not, provided with printed Rx to take to different pharmacies searching for round tablet formulation.       Relevant Medications   amLODipine (NORVASC) 5 MG tablet       Follow up plan: No Follow-up on file.  Ria Bush, MD

## 2017-05-01 ENCOUNTER — Other Ambulatory Visit (INDEPENDENT_AMBULATORY_CARE_PROVIDER_SITE_OTHER): Payer: Self-pay | Admitting: Specialist

## 2017-05-01 MED ORDER — IBUPROFEN-FAMOTIDINE 800-26.6 MG PO TABS: 1.0000 | ORAL_TABLET | Freq: Two times a day (BID) | ORAL | 6 refills | Status: DC

## 2017-05-03 ENCOUNTER — Other Ambulatory Visit (INDEPENDENT_AMBULATORY_CARE_PROVIDER_SITE_OTHER): Payer: Self-pay | Admitting: Radiology

## 2017-05-03 MED ORDER — IBUPROFEN-FAMOTIDINE 800-26.6 MG PO TABS: 1.0000 | ORAL_TABLET | Freq: Two times a day (BID) | ORAL | 6 refills | Status: DC

## 2017-06-28 ENCOUNTER — Ambulatory Visit (INDEPENDENT_AMBULATORY_CARE_PROVIDER_SITE_OTHER): Payer: BC Managed Care – PPO | Admitting: Specialist

## 2017-06-28 ENCOUNTER — Encounter (INDEPENDENT_AMBULATORY_CARE_PROVIDER_SITE_OTHER): Payer: Self-pay | Admitting: Specialist

## 2017-06-28 ENCOUNTER — Ambulatory Visit (INDEPENDENT_AMBULATORY_CARE_PROVIDER_SITE_OTHER): Payer: BC Managed Care – PPO

## 2017-06-28 VITALS — BP 132/77 | HR 61 | Ht 68.0 in | Wt 210.0 lb

## 2017-06-28 DIAGNOSIS — M4813 Ankylosing hyperostosis [Forestier], cervicothoracic region: Secondary | ICD-10-CM | POA: Diagnosis not present

## 2017-06-28 DIAGNOSIS — M542 Cervicalgia: Secondary | ICD-10-CM | POA: Diagnosis not present

## 2017-06-28 MED ORDER — IBUPROFEN-FAMOTIDINE 800-26.6 MG PO TABS: 1.0000 | ORAL_TABLET | Freq: Two times a day (BID) | ORAL | 3 refills | Status: DC

## 2017-06-28 NOTE — Progress Notes (Signed)
Office Visit Note   Patient: Carolyn [name]           Date of Birth: 1965/11/03           MRN: [id number] Visit Date: 06/28/2017              Requested by: Rosalee Kaufman, PA-C [place], [place] [address] PCP: Ria Bush, MD   Assessment & Plan: Visit Diagnoses:  1. Cervicalgia   2. Forestier's disease of cervicothoracic region     Plan:  Fall Prevention and Home Safety Falls cause injuries and can affect all age groups. It is possible to use preventive measures to significantly decrease the likelihood of falls. There are many simple measures which can make your home safer and prevent falls. OUTDOORS  Repair cracks and edges of walkways and driveways.  Remove high doorway thresholds.  Trim shrubbery on the main path into your home.  Have good outside lighting.  Clear walkways of tools, rocks, debris, and clutter.  Check that handrails are not broken and are securely fastened. Both sides of steps should have handrails.  Have leaves, snow, and ice cleared regularly.  Use sand or salt on walkways during winter months.  In the garage, clean up grease or oil spills. BATHROOM  Install night lights.  Install grab bars by the toilet and in the tub and shower.  Use non-skid mats or decals in the tub or shower.  Place a plastic non-slip stool in the shower to sit on, if needed.  Keep floors dry and clean up all water on the floor immediately.  Remove soap buildup in the tub or shower on a regular basis.  Secure bath mats with non-slip, double-sided rug tape.  Remove throw rugs and tripping hazards from the floors. BEDROOMS  Install night lights.  Make sure a bedside light is easy to reach.  Do not use oversized bedding.  Keep a telephone by your bedside.  Have a firm chair with side arms to use for getting dressed.  Remove throw rugs and tripping hazards from the floor. KITCHEN  Keep handles on pots and pans turned toward the  center of the stove. Use back burners when possible.  Clean up spills quickly and allow time for drying.  Avoid walking on wet floors.  Avoid hot utensils and knives.  Position shelves so they are not too high or low.  Place commonly used objects within easy reach.  If necessary, use a sturdy step stool with a grab bar when reaching.  Keep electrical cables out of the way.  Do not use floor polish or wax that makes floors slippery. If you must use wax, use non-skid floor wax.  Remove throw rugs and tripping hazards from the floor. STAIRWAYS  Never leave objects on stairs.  Place handrails on both sides of stairways and use them. Fix any loose handrails. Make sure handrails on both sides of the stairways are as long as the stairs.  Check carpeting to make sure it is firmly attached along stairs. Make repairs to worn or loose carpet promptly.  Avoid placing throw rugs at the top or bottom of stairways, or properly secure the rug with carpet tape to prevent slippage. Get rid of throw rugs, if possible.  Have an electrician put in a light switch at the top and bottom of the stairs. OTHER FALL PREVENTION TIPS  Wear low-heel or rubber-soled shoes that are supportive and fit well. Wear closed toe shoes.  When  using a stepladder, make sure it is fully opened and both spreaders are firmly locked. Do not climb a closed stepladder.  Add color or contrast paint or tape to grab bars and handrails in your home. Place contrasting color strips on first and last steps.  Learn and use mobility aids as needed. Install an electrical emergency response system.  Turn on lights to avoid dark areas. Replace light bulbs that burn out immediately. Get light switches that glow.  Arrange furniture to create clear pathways. Keep furniture in the same place.  Firmly attach carpet with non-skid or double-sided tape.  Eliminate uneven floor surfaces.  Select a carpet pattern that does not visually  hide the edge of steps.  Be aware of all pets. OTHER HOME SAFETY TIPS  Set the water temperature for 120 F (48.8 C).  Keep emergency numbers on or near the telephone.  Keep smoke detectors on every level of the home and near sleeping areas. Document Released: 04/01/2002 Document Revised: 10/11/2011 Document Reviewed: 07/01/2011 [facility] Patient Information 2014 [place]. Avoid overhead lifting and overhead use of the arms. Do not lift greater than 5 lbs. Adjust head rest in vehicle to prevent hyperextension if rear ended. Take extra precautions to avoid falling. Yoga or Tichi for balance and coordination. Follow-Up Instructions: Return in about 1 year (around 06/29/2018).   Orders:  Orders Placed This Encounter  Procedures  . XR Cervical Spine 2 or 3 views   Meds ordered this encounter  Medications  . Ibuprofen-Famotidine 800-26.6 MG TABS    Sig: Take 1 tablet by mouth 2 (two) times daily after a meal.    Dispense:  180 tablet    Refill:  3      Procedures: No procedures performed   Clinical Data: No additional findings.   Subjective: Chief Complaint  Patient presents with  . Neck - Follow-up    52 year old female with history of anterior removal spurs from C2-3 to C5. She is having dysphagia, heavy food and thicker is better. Small pills do not go done well. She is using a straw and forcing it down. She has tingling and numbness in the hands and feels clumbsy with fine manipulation, going through papers. Youngest daughter is protective. No bowel or bladder difficulty. She uses deuxisis for antiinflamatory response. Has aching in the in legs. Lateral thighs and has to change sides of the legs and greater trochanters.    Review of Systems  Constitutional: Negative.   HENT: Negative.   Eyes: Negative.   Respiratory: Negative.   Cardiovascular: Negative.   Gastrointestinal: Negative.   Endocrine: Negative.   Genitourinary: Negative.   Musculoskeletal:  Negative.   Skin: Negative.   Allergic/Immunologic: Negative.   Neurological: Negative.   Hematological: Negative.   Psychiatric/Behavioral: Negative.      Objective: Vital Signs: BP 132/77   Pulse 61   Ht 5\' 8"  (1.727 m)   Wt 210 lb (95.3 kg)   BMI 31.93 kg/m   Physical Exam  Constitutional: She is oriented to person, place, and time. She appears well-developed and well-nourished.  HENT:  Head: Normocephalic and atraumatic.  Eyes: EOM are normal. Pupils are equal, round, and reactive to light.  Neck: Normal range of motion. Neck supple.  Pulmonary/Chest: Effort normal and breath sounds normal.  Abdominal: Soft. Bowel sounds are normal.  Neurological: She is alert and oriented to person, place, and time.  Skin: Skin is warm and dry.  Psychiatric: She has a normal mood and affect.  Her behavior is normal. Judgment and thought content normal.    Back Exam   Tenderness  The patient is experiencing tenderness in the cervical and lumbar.  Range of Motion  Extension: abnormal  Flexion: abnormal  Lateral bend right: abnormal  Lateral bend left: abnormal  Rotation right: abnormal  Rotation left: abnormal   Muscle Strength  Right Quadriceps:  5/5  Left Quadriceps:  5/5  Right Hamstrings:  5/5  Left Hamstrings:  5/5   Tests  Straight leg raise right: negative Straight leg raise left: negative  Reflexes  Patellar: normal Achilles: normal  Other  Toe walk: normal Heel walk: normal Sensation: normal Gait: normal  Erythema: no back redness Scars: absent  Comments:  Severe limitation of ROM of the cervical spine with rotation and lateral bending decreased by 80% and flexion and extension by 70%, Hoffman's sign is mild bilateral. Motor shows no focal deficit. Ambulates normally. No upper or lower extremity spasticity or clonus      Specialty Comments:  No specialty comments available.  Imaging: No results found.   PMFS History: Patient Active Problem  List   Diagnosis Date Noted  . Esophageal tear 03/04/2015    Priority: High    Class: Acute  . HNP (herniated nucleus pulposus) with myelopathy, cervical 03/03/2015    Priority: High    Class: Chronic  . Spinal stenosis in cervical region 03/03/2015    Priority: High    Class: Chronic  . Health maintenance examination 11/22/2016  . Obesity, Class I, BMI 30-34.9 11/22/2016  . GERD (gastroesophageal reflux disease) 09/08/2016  . Chronic fatigue 09/08/2016  . Urge incontinence   . Family history of DVT 03/05/2015  . Chronic venous insufficiency 03/05/2015  . S/P cervical spinal fusion 03/03/2015  . Chest pain 12/18/2012  . Hypertension   . Heart murmur   . Dry eyes   . Depression   . DISH (diffuse idiopathic skeletal hyperostosis) 08/31/2012    Class: Diagnosis of  . Other dysphagia 08/31/2012    Class: Diagnosis of   Past Medical History:  Diagnosis Date  . Anxiety   . Arthritis    patient unsure  . Depression    but doesn't require any meds  . Diffuse idiopathic skeletal hyperostosis   . Dry eyes    uses Restasis bid  . Esophageal tear 03/04/2015   Left pyriformis sinus.   [name] GERD (gastroesophageal reflux disease)   . HCAP (healthcare-associated pneumonia) 03/05/2015  . Headache(784.0)    MRI in 2007 bc of headaches  . Heart murmur    New diagnosis  . History of chicken pox   . Hypertension    takes Hyzaar daily  . Neck pain    bone spur on esophagus  . PONV (postoperative nausea and vomiting) 2014   nausea and vomiting   . Spinal stenosis in cervical region 03/03/2015  . Urge incontinence     Family History  Problem Relation Age of Onset  . Heart murmur Father   . Hypertension Father   . Cancer Father        skin  . Colon polyps Father   . Hypertension Mother   . Cancer Paternal Aunt        uterine  . Cancer Maternal Grandmother        lung (non smoker)  . Cancer Maternal Grandfather 80       stomach  . Cancer Paternal Grandmother        lung  (smoker)  . Diabetes  Neg Hx   . CAD Neg Hx   . Stroke Neg Hx     Past Surgical History:  Procedure Laterality Date  . ABDOMINAL HYSTERECTOMY  2011   bleeding - ovaries remain  . ANTERIOR CERVICAL DECOMP/DISCECTOMY FUSION N/A 08/21/2012   Procedure: Excision of exostosis C2-3,C3-4, C4-5;  Surgeon: Jessy Oto, MD;  Location: [place];  Service: Orthopedics;  Laterality: N/A;  . ANTERIOR CERVICAL DECOMP/DISCECTOMY FUSION N/A 03/03/2015   Procedure: ANTERIOR CERVICAL DISCECTOMY AND FUSION WITH PARTIAL VERTEBRECTOMY C3 AND C4, CERVICAL PLATE AND SCREWS, ALLOGRAFT, LOCAL BONE GRAFT, VIVIGEN;  Surgeon: Jessy Oto, MD;  Location: [place];  Service: Orthopedics;  Laterality: N/A;  . COLONOSCOPY  12/2016   1 SSP, rpt 5 yrs - trouble tolerating prep (Nandigam)  . DIRECT LARYNGOSCOPY  03/03/2015   Procedure: DIRECT LARYNGOSCOPY AND PLACEMENT OF FEEDING TUBE;  Surgeon: [name], MD;  Location: [place];  Service: ENT;;  . ESOPHAGOGASTRODUODENOSCOPY  12/2013   WNL ([place])  . REPAIR OF ESOPHAGUS  03/03/2015   Procedure: REPAIR OF ESOPHAGUS;  Surgeon: [name], MD;  Location: [place];  Service: ENT;;   Social History   Occupational History    Comment: Extension Agent, [employer]  Tobacco Use  . Smoking status: Never Smoker  . Smokeless tobacco: Never Used  Substance and Sexual Activity  . Alcohol use: Yes    Comment: rarely  . Drug use: No  . Sexual activity: Yes    Birth control/protection: Surgical

## 2017-06-28 NOTE — Patient Instructions (Signed)
Fall Prevention and Home Safety Falls cause injuries and can affect all age groups. It is possible to use preventive measures to significantly decrease the likelihood of falls. There are many simple measures which can make your home safer and prevent falls. OUTDOORS  Repair cracks and edges of walkways and driveways.  Remove high doorway thresholds.  Trim shrubbery on the main path into your home.  Have good outside lighting.  Clear walkways of tools, rocks, debris, and clutter.  Check that handrails are not broken and are securely fastened. Both sides of steps should have handrails.  Have leaves, snow, and ice cleared regularly.  Use sand or salt on walkways during winter months.  In the garage, clean up grease or oil spills. BATHROOM  Install night lights.  Install grab bars by the toilet and in the tub and shower.  Use non-skid mats or decals in the tub or shower.  Place a plastic non-slip stool in the shower to sit on, if needed.  Keep floors dry and clean up all water on the floor immediately.  Remove soap buildup in the tub or shower on a regular basis.  Secure bath mats with non-slip, double-sided rug tape.  Remove throw rugs and tripping hazards from the floors. BEDROOMS  Install night lights.  Make sure a bedside light is easy to reach.  Do not use oversized bedding.  Keep a telephone by your bedside.  Have a firm chair with side arms to use for getting dressed.  Remove throw rugs and tripping hazards from the floor. KITCHEN  Keep handles on pots and pans turned toward the center of the stove. Use back burners when possible.  Clean up spills quickly and allow time for drying.  Avoid walking on wet floors.  Avoid hot utensils and knives.  Position shelves so they are not too high or low.  Place commonly used objects within easy reach.  If necessary, use a sturdy step stool with a grab bar when reaching.  Keep electrical cables out of the  way.  Do not use floor polish or wax that makes floors slippery. If you must use wax, use non-skid floor wax.  Remove throw rugs and tripping hazards from the floor. STAIRWAYS  Never leave objects on stairs.  Place handrails on both sides of stairways and use them. Fix any loose handrails. Make sure handrails on both sides of the stairways are as long as the stairs.  Check carpeting to make sure it is firmly attached along stairs. Make repairs to worn or loose carpet promptly.  Avoid placing throw rugs at the top or bottom of stairways, or properly secure the rug with carpet tape to prevent slippage. Get rid of throw rugs, if possible.  Have an electrician put in a light switch at the top and bottom of the stairs. OTHER FALL PREVENTION TIPS  Wear low-heel or rubber-soled shoes that are supportive and fit well. Wear closed toe shoes.  When using a stepladder, make sure it is fully opened and both spreaders are firmly locked. Do not climb a closed stepladder.  Add color or contrast paint or tape to grab bars and handrails in your home. Place contrasting color strips on first and last steps.  Learn and use mobility aids as needed. Install an electrical emergency response system.  Turn on lights to avoid dark areas. Replace light bulbs that burn out immediately. Get light switches that glow.  Arrange furniture to create clear pathways. Keep furniture in the same place.    Firmly attach carpet with non-skid or double-sided tape.  Eliminate uneven floor surfaces.  Select a carpet pattern that does not visually hide the edge of steps.  Be aware of all pets. OTHER HOME SAFETY TIPS  Set the water temperature for 120 F (48.8 C).  Keep emergency numbers on or near the telephone.  Keep smoke detectors on every level of the home and near sleeping areas. Document Released: 04/01/2002 Document Revised: 10/11/2011 Document Reviewed: 07/01/2011 [facility] Patient Information 2014  [place]. Avoid overhead lifting and overhead use of the arms. Do not lift greater than 5 lbs. Adjust head rest in vehicle to prevent hyperextension if rear ended. Take extra precautions to avoid falling. Yoga or Tichi for balance and coordination.

## 2017-11-17 ENCOUNTER — Other Ambulatory Visit: Payer: Self-pay | Admitting: Family Medicine

## 2017-12-10 ENCOUNTER — Other Ambulatory Visit: Payer: Self-pay | Admitting: Family Medicine

## 2017-12-10 DIAGNOSIS — R5382 Chronic fatigue, unspecified: Secondary | ICD-10-CM

## 2017-12-10 DIAGNOSIS — I10 Essential (primary) hypertension: Secondary | ICD-10-CM

## 2017-12-12 ENCOUNTER — Other Ambulatory Visit (INDEPENDENT_AMBULATORY_CARE_PROVIDER_SITE_OTHER): Payer: BC Managed Care – PPO

## 2017-12-12 ENCOUNTER — Encounter: Payer: BC Managed Care – PPO | Admitting: Family Medicine

## 2017-12-12 DIAGNOSIS — I10 Essential (primary) hypertension: Secondary | ICD-10-CM

## 2017-12-12 LAB — COMPREHENSIVE METABOLIC PANEL
ALK PHOS: 76 U/L (ref 39–117)
ALT: 15 U/L (ref 0–35)
AST: 13 U/L (ref 0–37)
Albumin: 4.4 g/dL (ref 3.5–5.2)
BILIRUBIN TOTAL: 0.6 mg/dL (ref 0.2–1.2)
BUN: 20 mg/dL (ref 6–23)
CO2: 32 mEq/L (ref 19–32)
Calcium: 10.1 mg/dL (ref 8.4–10.5)
Chloride: 102 mEq/L (ref 96–112)
Creatinine, Ser: 0.87 mg/dL (ref 0.40–1.20)
GFR: 72.69 mL/min (ref >60.00)
GLUCOSE: 112 mg/dL — AB (ref 70–99)
POTASSIUM: 3.8 meq/L (ref 3.5–5.1)
Sodium: 141 mEq/L (ref 135–145)
TOTAL PROTEIN: 7.5 g/dL (ref 6.0–8.3)

## 2017-12-12 LAB — LIPID PANEL
Cholesterol: 115 mg/dL (ref 0–200)
HDL: 39.7 mg/dL (ref >39.00)
LDL Cholesterol: 62 mg/dL (ref 0–99)
NONHDL: 75.6
Total CHOL/HDL Ratio: 3
Triglycerides: 68 mg/dL (ref 0.0–149.0)
VLDL: 13.6 mg/dL (ref 0.0–40.0)

## 2017-12-19 ENCOUNTER — Ambulatory Visit (INDEPENDENT_AMBULATORY_CARE_PROVIDER_SITE_OTHER)
Admission: RE | Admit: 2017-12-19 | Discharge: 2017-12-19 | Disposition: A | Discharge status: Home / Self Care | Payer: BC Managed Care – PPO | Source: Ambulatory Visit | Attending: Family Medicine | Admitting: Family Medicine

## 2017-12-19 ENCOUNTER — Encounter: Payer: Self-pay | Admitting: Family Medicine

## 2017-12-19 ENCOUNTER — Ambulatory Visit (INDEPENDENT_AMBULATORY_CARE_PROVIDER_SITE_OTHER): Payer: BC Managed Care – PPO | Admitting: Family Medicine

## 2017-12-19 VITALS — BP 130/78 | HR 69 | Temp 98.3°F | Ht 67.25 in | Wt 197.8 lb

## 2017-12-19 DIAGNOSIS — M4802 Spinal stenosis, cervical region: Secondary | ICD-10-CM

## 2017-12-19 DIAGNOSIS — G8929 Other chronic pain: Secondary | ICD-10-CM | POA: Diagnosis not present

## 2017-12-19 DIAGNOSIS — E669 Obesity, unspecified: Secondary | ICD-10-CM

## 2017-12-19 DIAGNOSIS — I10 Essential (primary) hypertension: Secondary | ICD-10-CM

## 2017-12-19 DIAGNOSIS — M4810 Ankylosing hyperostosis [Forestier], site unspecified: Secondary | ICD-10-CM

## 2017-12-19 DIAGNOSIS — M545 Low back pain, unspecified: Secondary | ICD-10-CM

## 2017-12-19 DIAGNOSIS — R1319 Other dysphagia: Secondary | ICD-10-CM

## 2017-12-19 DIAGNOSIS — Z0000 Encounter for general adult medical examination without abnormal findings: Secondary | ICD-10-CM | POA: Diagnosis not present

## 2017-12-19 DIAGNOSIS — E66811 Obesity, class 1: Secondary | ICD-10-CM

## 2017-12-19 DIAGNOSIS — M25562 Pain in left knee: Secondary | ICD-10-CM

## 2017-12-19 MED ORDER — LOSARTAN POTASSIUM-HCTZ 100-12.5 MG PO TABS: 1.0000 | ORAL_TABLET | Freq: Every day | ORAL | 3 refills | Status: DC

## 2017-12-19 MED ORDER — CYCLOBENZAPRINE HCL 10 MG PO TABS: 5.0000 mg | ORAL_TABLET | Freq: Two times a day (BID) | ORAL | 0 refills | Status: DC | PRN

## 2017-12-19 MED ORDER — AMLODIPINE BESYLATE 10 MG PO TABS: 10.0000 mg | ORAL_TABLET | Freq: Every day | ORAL | 3 refills | Status: DC

## 2017-12-19 NOTE — Patient Instructions (Addendum)
Blood pressure medicine changes as below (amlodipine 3m and hyzaar 100/12.51mdaily). Monitor blood pressures and let me know how they are running.  If interested, check with pharmacy about new 2 shot shingles series (shingrix).  Lumbar xray today. We may refer you to physical therapy.  We will be in touch regarding difficulty swallowing plan.  Return in 3 months for follow up visit.  Health Maintenance, Female Adopting a healthy lifestyle and getting preventive care can go a long way to promote health and wellness. Talk with your health care provider about what schedule of regular examinations is right for you. This is a good chance for you to check in with your provider about disease prevention and staying healthy. In between checkups, there are plenty of things you can do on your own. Experts have done a lot of research about which lifestyle changes and preventive measures are most likely to keep you healthy. Ask your health care provider for more information. Weight and diet Eat a healthy diet  Be sure to include plenty of vegetables, fruits, low-fat dairy products, and lean protein.  Do not eat a lot of foods high in solid fats, added sugars, or salt.  Get regular exercise. This is one of the most important things you can do for your health. ? Most adults should exercise for at least 150 minutes each week. The exercise should increase your heart rate and make you sweat (moderate-intensity exercise). ? Most adults should also do strengthening exercises at least twice a week. This is in addition to the moderate-intensity exercise.  Maintain a healthy weight  Body mass index (BMI) is a measurement that can be used to identify possible weight problems. It estimates body fat based on height and weight. Your health care provider can help determine your BMI and help you achieve or maintain a healthy weight.  For females 2063ears of age and older: ? A BMI below 18.5 is considered  underweight. ? A BMI of 18.5 to 24.9 is normal. ? A BMI of 25 to 29.9 is considered overweight. ? A BMI of 30 and above is considered obese.  Watch levels of cholesterol and blood lipids  You should start having your blood tested for lipids and cholesterol at 2049ears of age, then have this test every 5 years.  You may need to have your cholesterol levels checked more often if: ? Your lipid or cholesterol levels are high. ? You are older than 5064ears of age. ? You are at high risk for heart disease.  Cancer screening Lung Cancer  Lung cancer screening is recommended for adults 5572083ears old who are at high risk for lung cancer because of a history of smoking.  A yearly low-dose CT scan of the lungs is recommended for people who: ? Currently smoke. ? Have quit within the past 15 years. ? Have at least a 30-pack-year history of smoking. A pack year is smoking an average of one pack of cigarettes a day for 1 year.  Yearly screening should continue until it has been 15 years since you quit.  Yearly screening should stop if you develop a health problem that would prevent you from having lung cancer treatment.  Breast Cancer  Practice breast self-awareness. This means understanding how your breasts normally appear and feel.  It also means doing regular breast self-exams. Let your health care provider know about any changes, no matter how small.  If you are in your 20s or 30s, you should have  a clinical breast exam (CBE) by a health care provider every 1-3 years as part of a regular health exam.  If you are 109 or older, have a CBE every year. Also consider having a breast X-ray (mammogram) every year.  If you have a family history of breast cancer, talk to your health care provider about genetic screening.  If you are at high risk for breast cancer, talk to your health care provider about having an MRI and a mammogram every year.  Breast cancer gene (BRCA) assessment is  recommended for women who have family members with BRCA-related cancers. BRCA-related cancers include: ? Breast. ? Ovarian. ? Tubal. ? Peritoneal cancers.  Results of the assessment will determine the need for genetic counseling and BRCA1 and BRCA2 testing.  Cervical Cancer Your health care provider may recommend that you be screened regularly for cancer of the pelvic organs (ovaries, uterus, and vagina). This screening involves a pelvic examination, including checking for microscopic changes to the surface of your cervix (Pap test). You may be encouraged to have this screening done every 3 years, beginning at age 48.  For women ages 61-65, health care providers may recommend pelvic exams and Pap testing every 3 years, or they may recommend the Pap and pelvic exam, combined with testing for human papilloma virus (HPV), every 5 years. Some types of HPV increase your risk of cervical cancer. Testing for HPV may also be done on women of any age with unclear Pap test results.  Other health care providers may not recommend any screening for nonpregnant women who are considered low risk for pelvic cancer and who do not have symptoms. Ask your health care provider if a screening pelvic exam is right for you.  If you have had past treatment for cervical cancer or a condition that could lead to cancer, you need Pap tests and screening for cancer for at least 20 years after your treatment. If Pap tests have been discontinued, your risk factors (such as having a new sexual partner) need to be reassessed to determine if screening should resume. Some women have medical problems that increase the chance of getting cervical cancer. In these cases, your health care provider may recommend more frequent screening and Pap tests.  Colorectal Cancer  This type of cancer can be detected and often prevented.  Routine colorectal cancer screening usually begins at 52 years of age and continues through 52 years of  age.  Your health care provider may recommend screening at an earlier age if you have risk factors for colon cancer.  Your health care provider may also recommend using home test kits to check for hidden blood in the stool.  A small camera at the end of a tube can be used to examine your colon directly (sigmoidoscopy or colonoscopy). This is done to check for the earliest forms of colorectal cancer.  Routine screening usually begins at age 68.  Direct examination of the colon should be repeated every 5-10 years through 52 years of age. However, you may need to be screened more often if early forms of precancerous polyps or small growths are found.  Skin Cancer  Check your skin from head to toe regularly.  Tell your health care provider about any new moles or changes in moles, especially if there is a change in a mole's shape or color.  Also tell your health care provider if you have a mole that is larger than the size of a pencil eraser.  Always use  sunscreen. Apply sunscreen liberally and repeatedly throughout the day.  Protect yourself by wearing long sleeves, pants, a wide-brimmed hat, and sunglasses whenever you are outside.  Heart disease, diabetes, and high blood pressure  High blood pressure causes heart disease and increases the risk of stroke. High blood pressure is more likely to develop in: ? People who have blood pressure in the high end of the normal range (130-139/85-89 mm Hg). ? People who are overweight or obese. ? People who are African American.  If you are 1-54 years of age, have your blood pressure checked every 3-5 years. If you are 70 years of age or older, have your blood pressure checked every year. You should have your blood pressure measured twice-once when you are at a hospital or clinic, and once when you are not at a hospital or clinic. Record the average of the two measurements. To check your blood pressure when you are not at a hospital or clinic, you  can use: ? An automated blood pressure machine at a pharmacy. ? A home blood pressure monitor.  If you are between 49 years and 32 years old, ask your health care provider if you should take aspirin to prevent strokes.  Have regular diabetes screenings. This involves taking a blood sample to check your fasting blood sugar level. ? If you are at a normal weight and have a low risk for diabetes, have this test once every three years after 52 years of age. ? If you are overweight and have a high risk for diabetes, consider being tested at a younger age or more often. Preventing infection Hepatitis B  If you have a higher risk for hepatitis B, you should be screened for this virus. You are considered at high risk for hepatitis B if: ? You were born in a country where hepatitis B is common. Ask your health care provider which countries are considered high risk. ? Your parents were born in a high-risk country, and you have not been immunized against hepatitis B (hepatitis B vaccine). ? You have HIV or AIDS. ? You use needles to inject street drugs. ? You live with someone who has hepatitis B. ? You have had sex with someone who has hepatitis B. ? You get hemodialysis treatment. ? You take certain medicines for conditions, including cancer, organ transplantation, and autoimmune conditions.  Hepatitis C  Blood testing is recommended for: ? Everyone born from [birth year] through 1965. ? Anyone with known risk factors for hepatitis C.  Sexually transmitted infections (STIs)  You should be screened for sexually transmitted infections (STIs) including gonorrhea and chlamydia if: ? You are sexually active and are younger than 52 years of age. ? You are older than 52 years of age and your health care provider tells you that you are at risk for this type of infection. ? Your sexual activity has changed since you were last screened and you are at an increased risk for chlamydia or gonorrhea. Ask your  health care provider if you are at risk.  If you do not have HIV, but are at risk, it may be recommended that you take a prescription medicine daily to prevent HIV infection. This is called pre-exposure prophylaxis (PrEP). You are considered at risk if: ? You are sexually active and do not regularly use condoms or know the HIV status of your partner(s). ? You take drugs by injection. ? You are sexually active with a partner who has HIV.  Talk with your health  care provider about whether you are at high risk of being infected with HIV. If you choose to begin PrEP, you should first be tested for HIV. You should then be tested every 3 months for as long as you are taking PrEP. Pregnancy  If you are premenopausal and you may become pregnant, ask your health care provider about preconception counseling.  If you may become pregnant, take 400 to 800 micrograms (mcg) of folic acid every day.  If you want to prevent pregnancy, talk to your health care provider about birth control (contraception). Osteoporosis and menopause  Osteoporosis is a disease in which the bones lose minerals and strength with aging. This can result in serious bone fractures. Your risk for osteoporosis can be identified using a bone density scan.  If you are 34 years of age or older, or if you are at risk for osteoporosis and fractures, ask your health care provider if you should be screened.  Ask your health care provider whether you should take a calcium or vitamin D supplement to lower your risk for osteoporosis.  Menopause may have certain physical symptoms and risks.  Hormone replacement therapy may reduce some of these symptoms and risks. Talk to your health care provider about whether hormone replacement therapy is right for you. Follow these instructions at home:  Schedule regular health, dental, and eye exams.  Stay current with your immunizations.  Do not use any tobacco products including cigarettes, chewing  tobacco, or electronic cigarettes.  If you are pregnant, do not drink alcohol.  If you are breastfeeding, limit how much and how often you drink alcohol.  Limit alcohol intake to no more than 1 drink per day for nonpregnant women. One drink equals 12 ounces of beer, 5 ounces of wine, or 1 ounces of hard liquor.  Do not use street drugs.  Do not share needles.  Ask your health care provider for help if you need support or information about quitting drugs.  Tell your health care provider if you often feel depressed.  Tell your health care provider if you have ever been abused or do not feel safe at home. This information is not intended to replace advice given to you by your health care provider. Make sure you discuss any questions you have with your health care provider. Document Released: 10/25/2010 Document Revised: 09/17/2015 Document Reviewed: 01/13/2015 Elsevier Interactive Patient Education  [employer].

## 2017-12-19 NOTE — Progress Notes (Signed)
BP 130/78 (BP Location: Left Arm, Patient Position: Sitting, Cuff Size: Normal)   Pulse 69   Temp 98.3 F (36.8 C) (Oral)   Ht 5' 7.25" (1.708 m)   Wt 197 lb 12 oz (89.7 kg)   SpO2 99%   BMI 30.74 kg/m    CC: CPE, discuss several other issues Subjective:    Patient ID: [name], female    DOB: 1966/04/19, 52 y.o.   MRN: [id number]  HPI: ALEC MCPHEE is a 52 y.o. female presenting on 12/19/2017 for Annual Exam (Wants to discuss amlodipine. Also, pt wants to be looked over head-to-toe.)   Ongoing LBP flares worse over last 3 months - becoming more frequent. Has seen Dr Louanne Skye ortho - latest xrays were 06/2017. Flares of severe pain can last 30 min then resolve. Seems positional - triggers can be sitting to standing transition, seems worse after BM, worse with constipation. Does fine with car driving. Duexis helps a little, takes a while to take effect. Noticing increasing stiffness of hips as well.   Wants L popliteal area checked - tender and tight chronically, worse over last 4 months. Known varicose veins, wears high dose compression stockings. Knee gives out going down stairs. She regularly wears compression stockings.   Chronic dysphagia from DISH worse after esophageal tear after surgery - worsening trouble with pill dysphagia. Amlodipine round tablets not available in 5mg  - requests 10mg  dose. Saw GI last year s/p colonoscopy. Trouble initiating swallow - needs to drink with straw, chronic issue. Has seen [place], latest MBS 2016.   Several neck procedures in the past -  2014 anterior cervical exostoses removal C2-5 2016 ACDF with partial vertebrectomy C3 and C4, esophageal repair (2cm tear near pyriform sinus) MBS 02/2015 - mod pharyngeal dysphagia due to edema.   Separated from husband.   Preventative: COLONOSCOPY 12/2016 - 1 SSP, rpt 5 yrs - trouble tolerating prep (Nandigam) Well woman with OBGYN Dr Derenda Mis in [place] last 2016 - released from care. S/p hysterectomy,  ovaries remain.  Flu shot - doesn't receive  Tdap - 10/2016 zostavax 2017 shingrix - pt interested Seat belt use discussed Sunscreen use discussed. No changing moles on skin. Sees Dr Nevada Crane. 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 Interim medical history since our last visit reviewed. Allergies and medications reviewed and updated. Outpatient Medications Prior to Visit  Medication Sig Dispense Refill  . Ibuprofen-Famotidine 800-26.6 MG TABS Take 1 tablet by mouth 2 (two) times daily after a meal. 180 tablet 3  . Psyllium (METAMUCIL FIBER PO) Take by mouth as needed.    [name] amLODipine (NORVASC) 5 MG tablet Take 1 tablet (5 mg total) by mouth daily. 90 tablet 3  . losartan-hydrochlorothiazide (HYZAAR) 100-25 MG tablet TAKE 1 TABLET BY MOUTH ONCE DAILY 90 tablet 0  . cycloSPORINE (RESTASIS) 0.05 % ophthalmic emulsion Place 1 drop into both eyes 2 (two) times daily.     Facility-Administered Medications Prior to Visit  Medication Dose Route Frequency Provider Last Rate Last Dose  . 0.9 %  sodium chloride  infusion  500 mL Intravenous Continuous Nandigam, Kavitha V, MD         Per HPI unless specifically indicated in ROS section below Review of Systems  Constitutional: Negative for activity change, appetite change, chills, fatigue, fever and unexpected weight change.  HENT: Negative for hearing loss.   Eyes: Negative for visual disturbance.  Respiratory: Positive for shortness of breath. Negative for cough, chest tightness and wheezing.   Cardiovascular:  Positive for leg swelling. Negative for chest pain and palpitations.  Gastrointestinal: Positive for blood in stool (hemorrhoid related) and constipation (on miralax). Negative for abdominal distention, abdominal pain, diarrhea, nausea and vomiting.  Genitourinary: Negative for difficulty urinating and hematuria.  Musculoskeletal: Positive for back pain and neck pain. Negative for arthralgias and myalgias.  Skin: Negative for rash.  Neurological: Negative for dizziness, seizures, syncope and headaches.  Hematological: Negative for adenopathy. Does not bruise/bleed easily.  Psychiatric/Behavioral: Negative for dysphoric mood. The patient is not nervous/anxious.        Objective:    BP 130/78 (BP Location: Left Arm, Patient Position: Sitting, Cuff Size: Normal)   Pulse 69   Temp 98.3 F (36.8 C) (Oral)   Ht 5' 7.25" (1.708 m)   Wt 197 lb 12 oz (89.7 kg)   SpO2 99%   BMI 30.74 kg/m   Wt Readings from Last 3 Encounters:  12/19/17 197 lb 12 oz (89.7 kg)  06/28/17 210 lb (95.3 kg)  03/24/17 210 lb (95.3 kg)    Physical Exam  Constitutional: She is oriented to person, place, and time. She appears well-developed and well-nourished. No distress.  HENT:  Head: Normocephalic and atraumatic.  Right Ear: Hearing, tympanic membrane, external ear and ear canal normal.  Left Ear: Hearing, tympanic membrane, external ear and ear canal normal.  Nose: Nose normal.  Mouth/Throat: Uvula is midline, oropharynx is clear and moist and mucous membranes are normal. No oropharyngeal exudate, posterior oropharyngeal edema or posterior oropharyngeal erythema.  Eyes: Pupils are equal, round, and reactive to light. Conjunctivae and EOM are normal. No scleral icterus.  Neck: Normal range of motion. Neck supple.  Cardiovascular: Normal rate, regular rhythm, normal heart sounds and intact distal pulses.  No murmur heard. Pulses:      Radial pulses are 2+ on the right side, and 2+ on the left side.    Pulmonary/Chest: Effort normal and breath sounds normal. No respiratory distress. She has no wheezes. She has no rales.  Abdominal: Soft. Bowel sounds are normal. She exhibits no distension and no mass. There is no tenderness. There is no rebound and no guarding.  Musculoskeletal: Normal range of motion. She exhibits no edema.  Mild discomfort midline lower lumbar spine Discomfort L lumbar paraspinous mm  Neg SLR bilaterally. No pain with int/ext rotation at hip. Neg FABER. Stiff movements throughout No pain at SIJ, GTB or sciatic notch bilaterally.  R calf circ 41.5cm L calf circ 40.5cm  Lymphadenopathy:    She has no cervical adenopathy.  Neurological: She is alert and oriented to person, place, and time.  Reflex Scores:      Patellar reflexes are 2+ on the right side and 2+ on the left side. CN grossly intact, station and gait intact  Skin: Skin is warm and dry. No rash noted.  Psychiatric: She has a normal mood and affect. Her behavior is normal. Judgment and thought content normal.  Nursing note and vitals reviewed.  Results for orders placed or performed in visit on 12/12/17  Lipid panel  Result Value Ref Range   Cholesterol 115 0 - 200 mg/dL   Triglycerides 68.0 0.0 - 149.0 mg/dL   HDL 39.70 >39.00 mg/dL   VLDL 13.6 0.0 - 40.0 mg/dL   LDL Cholesterol 62 0 - 99 mg/dL   Total CHOL/HDL Ratio 3    NonHDL 75.60   Comprehensive metabolic panel  Result Value Ref Range   Sodium 141 135 - 145 mEq/L   Potassium 3.8 3.5 - 5.1 mEq/L   Chloride 102 96 - 112 mEq/L   CO2 32 19 - 32 mEq/L   Glucose, Bld 112 (H) 70 - 99 mg/dL   BUN 20 6 - 23 mg/dL   Creatinine, Ser 0.87 0.40 - 1.20 mg/dL   Total Bilirubin 0.6 0.2 - 1.2 mg/dL   Alkaline Phosphatase 76 39 - 117 U/L   AST 13 0 - 37 U/L   ALT 15 0 - 35 U/L   Total Protein 7.5 6.0 - 8.3 g/dL   Albumin 4.4 3.5 - 5.2 g/dL   Calcium 10.1 8.4 - 10.5 mg/dL   GFR 72.69 >60.00 mL/min      Assessment & Plan:   Problem List Items  Addressed This Visit      Chronic   Spinal stenosis in cervical region    Chronic issue, has seen neuro and ortho         Diagnosis of   Other dysphagia (Chronic)    Known h/o esophageal dysphagia likely related to cervical exostoses from DISH and residual edema from prior esophageal tear.  Pt has developed strategies to manage this.  Will offer repeat MBS to re-eval this.  Will change amlodipine formulation to round 10mg  tablet.       DISH (diffuse idiopathic skeletal hyperostosis)    Known cervical trouble. Check lumbar films.  Consider PT course.         Other   Posterior left knee pain    Anticipate baker's cyst. Not consistent with DVT.       Obesity, Class I, BMI 30-34.9    Encouraged ongoing healthy diet and lifestyle changes for sustainable weight loss      Hypertension    Trouble swallowing 5mg  amlodipine dose but able to tolerate 10mg  dose (smaller round tablet) - will change antihypertensive regimen to 10mg  amlodipine and decrease hyzaar to 100/12.5mg .       Relevant Medications   amLODipine (NORVASC) 10 MG tablet   losartan-hydrochlorothiazide (HYZAAR) 100-12.5 MG tablet   Health maintenance examination - Primary    Preventative protocols reviewed and updated unless pt declined. Discussed healthy diet and lifestyle.       Chronic low back pain without sciatica    Progressive. Likely DISH related however see below for new sharp pain eval/plan.       Relevant Medications   cyclobenzaprine (FLEXERIL) 10 MG tablet   Other Relevant Orders   DG Lumbar Spine Complete (Completed)   Acute low back pain    Increasing frequency of acute flares of severe debilitating lower back pain that can quickly resolve. This sounds more consistent with a lumbar paraspinous mm spasm. Will check lumbar films today in h/o DISH, consider PT, recommended gentle stretching and trial muscle relaxant (flexeril).      Relevant Medications   cyclobenzaprine (FLEXERIL) 10 MG tablet         Meds ordered this encounter  Medications  . amLODipine (NORVASC) 10 MG tablet    Sig: Take 1 tablet (10 mg total) by mouth daily.  Dispense:  90 tablet    Refill:  3  . losartan-hydrochlorothiazide (HYZAAR) 100-12.5 MG tablet    Sig: Take 1 tablet by mouth daily.    Dispense:  90 tablet    Refill:  3  . cyclobenzaprine (FLEXERIL) 10 MG tablet    Sig: Take 0.5-1 tablets (5-10 mg total) by mouth 2 (two) times daily as needed for muscle spasms.    Dispense:  30 tablet    Refill:  0   Orders Placed This Encounter  Procedures  . DG Lumbar Spine Complete    Standing Status:   Future    Number of Occurrences:   1    Standing Expiration Date:   02/19/2019    Order Specific Question:   Reason for Exam (SYMPTOM  OR DIAGNOSIS REQUIRED)    Answer:   lower back pain    Order Specific Question:   Is patient pregnant?    Answer:   No    Order Specific Question:   Preferred imaging location?    Answer:   [facility]    Order Specific Question:   Radiology Contrast Protocol - do NOT remove file path    Answer:   [email or web address]    Follow up plan: Return in about 3 months (around 03/21/2018) for follow up visit.  Ria Bush, MD

## 2017-12-23 DIAGNOSIS — G8929 Other chronic pain: Secondary | ICD-10-CM | POA: Insufficient documentation

## 2017-12-23 DIAGNOSIS — M545 Low back pain, unspecified: Secondary | ICD-10-CM | POA: Insufficient documentation

## 2017-12-23 DIAGNOSIS — M25562 Pain in left knee: Secondary | ICD-10-CM | POA: Insufficient documentation

## 2017-12-23 NOTE — Assessment & Plan Note (Addendum)
Known cervical trouble. Check lumbar films.  Consider PT course.

## 2017-12-23 NOTE — Assessment & Plan Note (Signed)
Preventative protocols reviewed and updated unless pt declined. Discussed healthy diet and lifestyle.  

## 2017-12-23 NOTE — Assessment & Plan Note (Addendum)
Trouble swallowing 5mg  amlodipine dose but able to tolerate 10mg  dose (smaller round tablet) - will change antihypertensive regimen to 10mg  amlodipine and decrease hyzaar to 100/12.5mg .

## 2017-12-23 NOTE — Assessment & Plan Note (Signed)
Encouraged ongoing healthy diet and lifestyle changes for sustainable weight loss

## 2017-12-23 NOTE — Assessment & Plan Note (Addendum)
Increasing frequency of acute flares of severe debilitating lower back pain that can quickly resolve. This sounds more consistent with a lumbar paraspinous mm spasm. Will check lumbar films today in h/o DISH, consider PT, recommended gentle stretching and trial muscle relaxant (flexeril).

## 2017-12-23 NOTE — Assessment & Plan Note (Signed)
Chronic issue, has seen neuro and ortho

## 2017-12-23 NOTE — Assessment & Plan Note (Signed)
Progressive. Likely DISH related however see below for new sharp pain eval/plan.

## 2017-12-23 NOTE — Assessment & Plan Note (Addendum)
Anticipate baker's cyst. Not consistent with DVT.

## 2017-12-23 NOTE — Assessment & Plan Note (Addendum)
Known h/o esophageal dysphagia likely related to cervical exostoses from DISH and residual edema from prior esophageal tear.  Pt has developed strategies to manage this.  Will offer repeat MBS to re-eval this.  Will change amlodipine formulation to round 10mg  tablet.

## 2018-01-01 ENCOUNTER — Telehealth: Payer: Self-pay

## 2018-01-01 DIAGNOSIS — M4802 Spinal stenosis, cervical region: Secondary | ICD-10-CM

## 2018-01-01 DIAGNOSIS — M545 Low back pain, unspecified: Secondary | ICD-10-CM

## 2018-01-01 DIAGNOSIS — S1121XD Laceration without foreign body of pharynx and cervical esophagus, subsequent encounter: Secondary | ICD-10-CM

## 2018-01-01 DIAGNOSIS — R1319 Other dysphagia: Secondary | ICD-10-CM

## 2018-01-01 DIAGNOSIS — M4810 Ankylosing hyperostosis [Forestier], site unspecified: Secondary | ICD-10-CM

## 2018-01-01 DIAGNOSIS — M5000 Cervical disc disorder with myelopathy, unspecified cervical region: Secondary | ICD-10-CM

## 2018-01-01 NOTE — Telephone Encounter (Signed)
Copied from [place] [id number]. Topic: Inquiry >> Jan 01, 2018  4:08 PM Margot Ables wrote: Reason for CRM: pt called to check status of swallow test. Please call to advise.

## 2018-01-02 NOTE — Telephone Encounter (Signed)
Please put in [id number] to order the MBS and choose [facility] as the location

## 2018-01-02 NOTE — Telephone Encounter (Signed)
Referrals placed 

## 2018-01-05 ENCOUNTER — Other Ambulatory Visit (HOSPITAL_COMMUNITY): Payer: Self-pay | Admitting: Family Medicine

## 2018-01-05 DIAGNOSIS — R1310 Dysphagia, unspecified: Secondary | ICD-10-CM

## 2018-01-10 ENCOUNTER — Ambulatory Visit (HOSPITAL_COMMUNITY)
Admission: RE | Admit: 2018-01-10 | Discharge: 2018-01-10 | Disposition: A | Discharge status: Home / Self Care | Payer: BC Managed Care – PPO | Source: Ambulatory Visit | Attending: Family Medicine | Admitting: Family Medicine

## 2018-01-10 DIAGNOSIS — R1310 Dysphagia, unspecified: Secondary | ICD-10-CM | POA: Diagnosis present

## 2018-01-10 DIAGNOSIS — M4802 Spinal stenosis, cervical region: Secondary | ICD-10-CM

## 2018-01-10 DIAGNOSIS — S1121XD Laceration without foreign body of pharynx and cervical esophagus, subsequent encounter: Secondary | ICD-10-CM

## 2018-01-10 DIAGNOSIS — R1319 Other dysphagia: Secondary | ICD-10-CM

## 2018-01-10 NOTE — Progress Notes (Signed)
Modified Barium Swallow Progress Note  Patient Details  Name: Carolyn [name] MRN: [id number] Date of Birth: 06-Jan-1966  Today's Date: 01/10/2018  Modified Barium Swallow completed.  Full report located under Chart Review in the Imaging Section.  Brief recommendations include the following:  Clinical Impression  Pt demonstrates swallow within functional limits. Subjectively she reports a sensation of difficulty initaiting a swallow. Images show mild lingual pumping with small cup sips, but rapid, automatic swallow with larger straw sips/consectutive sips. Pt seems to require or elicit premature spill to trigger swallow response. Suspect potential sensaory changes following multiple cervical spine suergeries that require increase sensory feedback to elicit less effortful swallow.  When pt given very small pill, it lodged in valleculae, likely due to small significantly curved epiglottis, cleared with further sips.  Otherwise, hyolaryngeal function WNL. No further interventions needed, discussed finding with pt.    Swallow Evaluation Recommendations       SLP Diet Recommendations: Regular solids;Thin liquid   Liquid Administration via: Cup;Straw   Medication Administration: Whole meds with puree   Supervision: Patient able to self feed   Compensations: Other (Comment)(use straw, larger bolus beneficial)              Herbie Baltimore, MA CCC-SLP [phone]   Lynann Beaver 01/10/2018,3:17 PM

## 2018-01-18 ENCOUNTER — Ambulatory Visit (HOSPITAL_COMMUNITY): Payer: BC Managed Care – PPO | Attending: Family Medicine

## 2018-01-18 ENCOUNTER — Encounter (HOSPITAL_COMMUNITY): Payer: Self-pay

## 2018-01-18 ENCOUNTER — Other Ambulatory Visit: Payer: Self-pay

## 2018-01-18 DIAGNOSIS — M5442 Lumbago with sciatica, left side: Secondary | ICD-10-CM | POA: Insufficient documentation

## 2018-01-18 DIAGNOSIS — M6281 Muscle weakness (generalized): Secondary | ICD-10-CM | POA: Insufficient documentation

## 2018-01-18 DIAGNOSIS — M4802 Spinal stenosis, cervical region: Secondary | ICD-10-CM | POA: Diagnosis present

## 2018-01-18 DIAGNOSIS — G8929 Other chronic pain: Secondary | ICD-10-CM | POA: Diagnosis present

## 2018-01-18 DIAGNOSIS — R29898 Other symptoms and signs involving the musculoskeletal system: Secondary | ICD-10-CM | POA: Insufficient documentation

## 2018-01-18 DIAGNOSIS — R293 Abnormal posture: Secondary | ICD-10-CM | POA: Insufficient documentation

## 2018-01-18 NOTE — Patient Instructions (Signed)
Isometric Abdominal    Lying on back with knees bent, tighten stomach by pressing elbows down. Hold _3-5___ seconds. Repeat _10-15___ times per set. Do 1____ sets per session. Do _1___ sessions per day.  http://orth.exer.us/1087   Copyright  VHI. All rights reserved.    Flexors, Supine Bridge    Lie supine, feet shoulder-width apart. Lift hips toward ceiling. Hold 2-3___ seconds. Repeat _10-15__ times per session. Do 1___ sessions per day.  Copyright  VHI. All rights reserved.

## 2018-01-19 NOTE — Therapy (Signed)
[place], [place], [address] Phone: [phone]   Fax:  [phone]  Physical Therapy Evaluation  Patient Details  Name: Carolyn [name] MRN: [id number] Date of Birth: Aug 10, 1965 Referring Provider (PT): Ria Bush, MD   Encounter Date: 01/18/2018  PT End of Session - 01/19/18 1459    Visit Number  1    Number of Visits  12    Date for PT Re-Evaluation  02/15/18   mini-re-assessment 02/01/2018   Authorization Type  [place] - 80/20; visits based on medical necessity; deductible met; no auth required    Authorization Time Period  01/18/2018 - 02/15/2018    Authorization - Visit Number  1    Authorization - Number of Visits  10    PT Start Time  1030    PT Stop Time  1115    PT Time Calculation (min)  45 min    Activity Tolerance  Patient tolerated treatment well    Behavior During Therapy  [facility] for tasks assessed/performed       Past Medical History:  Diagnosis Date  . Anxiety   . Arthritis    patient unsure  . Depression    but doesn't require any meds  . Diffuse idiopathic skeletal hyperostosis   . Dry eyes    uses Restasis bid  . Esophageal tear 03/04/2015   Left pyriformis sinus.   [name] GERD (gastroesophageal reflux disease)   . HCAP (healthcare-associated pneumonia) 03/05/2015  . Headache(784.0)    MRI in 2007 bc of headaches  . Heart murmur    New diagnosis  . History of chicken pox   . Hypertension    takes Hyzaar daily  . Neck pain    bone spur on esophagus  . PONV (postoperative nausea and vomiting) 2014   nausea and vomiting   . Spinal stenosis in cervical region 03/03/2015  . Urge incontinence     Past Surgical History:  Procedure Laterality Date  . ABDOMINAL HYSTERECTOMY  2011   bleeding - ovaries remain  . ANTERIOR CERVICAL DECOMP/DISCECTOMY FUSION N/A 08/21/2012   Procedure: Excision of exostosis C2-3,C3-4, C4-5;  Surgeon: Jessy Oto, MD;  Location: [place];   Service: Orthopedics;  Laterality: N/A;  . ANTERIOR CERVICAL DECOMP/DISCECTOMY FUSION N/A 03/03/2015   Procedure: ANTERIOR CERVICAL DISCECTOMY AND FUSION WITH PARTIAL VERTEBRECTOMY C3 AND C4, CERVICAL PLATE AND SCREWS, ALLOGRAFT, LOCAL BONE GRAFT, VIVIGEN;  Surgeon: Jessy Oto, MD;  Location: [place];  Service: Orthopedics;  Laterality: N/A;  . COLONOSCOPY  12/2016   1 SSP, rpt 5 yrs - trouble tolerating prep (Nandigam)  . DIRECT LARYNGOSCOPY  03/03/2015   Procedure: DIRECT LARYNGOSCOPY AND PLACEMENT OF FEEDING TUBE;  Surgeon: [name], MD;  Location: [place];  Service: ENT;;  . ESOPHAGOGASTRODUODENOSCOPY  12/2013   WNL ([place])  . REPAIR OF ESOPHAGUS  03/03/2015   Procedure: REPAIR OF ESOPHAGUS;  Surgeon: [name], MD;  Location: [place];  Service: ENT;;    There were no vitals filed for this visit.       [facility] PT Assessment - 01/19/18 0001      Assessment   Medical Diagnosis  spinal stenosis cervical region, LBP with bilateral sciatica, weakness of right foot, LE numbness, C3-4 fusion 11/20126, herniated nucleus pulposis w/ stenosis    Referring Provider (PT)  Ria Bush, MD    Onset Date/Surgical Date  01/02/18    Prior Therapy  Yes  Precautions   Precautions  None      Restrictions   Weight Bearing Restrictions  No      Balance Screen   Has the patient fallen in the past 6 months  No   has stumbled but has caught self   Has the patient had a decrease in activity level because of a fear of falling?   Yes   not exercising as much as used to   Is the patient reluctant to leave their home because of a fear of falling?   No      Home Film/video editor residence    Living Arrangements  Children      Prior Function   Level of Independence  Independent    Vocation  Full time employment    Vocation Requirements  agricultural extension aide    Leisure  exercise at the [employer], hiking, walking, white water rafting      Cognition   Overall  Cognitive Status  Within Functional Limits for tasks assessed      Observation/Other Assessments   Focus on Therapeutic Outcomes (FOTO)   56% limited      Posture/Postural Control   Posture/Postural Control  Postural limitations    Postural Limitations  Rounded Shoulders;Forward head;Decreased lumbar lordosis;Flexed trunk   Increased cervical extension     AROM   AROM Assessment Site  Lumbar    Lumbar Flexion  50% limited    Lumbar Extension  75% limited    Lumbar - Right Side Bend  75% limited    Lumbar - Left Side Bend  75% limited    Lumbar - Right Rotation  75% limited    Lumbar - Left Rotation  75% limited      Strength   Strength Assessment Site  Hip;Knee;Ankle    Right Hip Flexion  4/5    Right Hip Extension  3-/5    Right Hip ABduction  4/5    Left Hip Flexion  4/5    Left Hip Extension  3-/5    Left Hip ABduction  4/5    Right/Left Knee  Right;Left    Right Knee Flexion  4/5    Right Knee Extension  5/5    Left Knee Flexion  4/5    Left Knee Extension  5/5    Right/Left Ankle  Right;Left    Right Ankle Dorsiflexion  4+/5    Left Ankle Dorsiflexion  4+/5      Flexibility   Soft Tissue Assessment /Muscle Length  yes    Hamstrings  Lt and Rt < 120 degees grossly                Objective measurements completed on examination: See above findings.              PT Education - 01/19/18 1457    Education Details  Examination findings and POC; discussed purpose and technique of interventions/assessment throughout session. HEP.    Person(s) Educated  Patient    Methods  Explanation;Demonstration;Handout    Comprehension  Verbalized understanding;Need further instruction;Verbal cues required;Tactile cues required       PT Short Term Goals - 01/19/18 1943      PT SHORT TERM GOAL #1   Title  Patient will be independent in performance of an initial HEP on a regular basis for symptom control.     Time  2    Period  Weeks    Status  New  Target  Date  02/01/18      PT SHORT TERM GOAL #2   Title  Patient will report a 25% reduction in low back and left leg symptoms with activities requiring forward bending, such as picking an object up off the floor.    Time  2    Period  Weeks    Status  New    Target Date  02/01/18        PT Long Term Goals - 01/19/18 1954      PT LONG TERM GOAL #1   Title  Patient will be independent in performance of an advanced HEP on a regular basis for symptom control.     Time  5   [place]20[place]19   Period  Weeks    Status  New    Target Date  02/22/18      PT LONG TERM GOAL #2   Title  Patient will exhibit a 10% or > improvement in FOTO score indicating an improvement in percieved function abilities.     Baseline  initial - 56% limited    Time  5    Period  Weeks    Status  New      PT LONG TERM GOAL #3   Title  Patient will be able to control LBP and Lt LE symptoms such that she is able to perform a 10 minute walking program 3x/wk.     Time  5    Period  Weeks    Status  New    Target Date  02/22/18             Plan - 01/19/18 1914    Clinical Impression Statement  Patient is a 52 year old female with a somewhat complicated spinal history, especially in the cervical region. Diagnosis of diffuse idiopathic skeletal hyperostosis (DISH) such that she had a natural fusion of C2-C5. s/p fusion C2-C5; s/p re-fusion of C3-4 due to pressure on the spinal cord. Patient presents today with complaints of low back pain bilaterally with intermittent pain down her leg left to the back of her knee. Unclear whether etiology is from cervical or lumbar regions. Patient verbalized clearly she just wants to address the lumbar and left leg pain so she can function more normally at home and at work. 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 Patient is looking for reduced pain and a more consistent functional life without so  much up and down LBP and left leg pain. Patient exhibits postural abnormalities, pain, decreased spinal AROM, decreased strength in proximal lower extremities, decreased flexibility especially in bilateral hamstrings,  and decreased functional abilities. Patient would benefit from skilled physical therapy to address the aforementioned deficits, decrease pain and improve her QOL.     History and Personal Factors relevant to plan of care:  DISH (diffuse idiopathic skeletal hyperostosis), 2014 anterior cervical exostoses removal C2-5, 2016 ACDF with partial vertebrectomy C3 and C4, esophageal repair    Allergies,    Arthritis,    Back pain,    Gastrointestinal Disease,    Headaches,    High Blood Pressure,    Incontinence,    Kidney, Bladder, Prostate or Urination Problems,    Prior Surgery,    Prosthesis / Implants ,     Clinical Presentation  Stable    Clinical Presentation due to:  FOTO, lumbar spine AROM, pain, MMT, flexibility, posture, clinical jugement  Clinical Decision Making  Moderate    Rehab Potential  Fair    Clinical Impairments Affecting Rehab Potential  positive - activity level at home and work; negative - complicated cervical spine history with spinal cord compression symptoms    PT Frequency  3x / week    PT Duration  4 weeks    PT Treatment/Interventions  ADLs/Self Care Home Management;Aquatic Therapy;Gait training;Stair training;Therapeutic activities;Therapeutic exercise;Balance training;Neuromuscular re-education;Patient/family education;Manual techniques;Passive range of motion;Dry needling;Spinal Manipulations;Energy conservation;Joint Manipulations    PT Next Visit Plan  review eval/goals, review HEP, initiate core strengthening, assess body mechanics for transfers and lifting, assess reflexes and light touch sensation. proximal hip strengthening    PT Home Exercise Plan  initial - isometric abdominal, supine bridge    Consulted and Agree with Plan of Care  Patient        Patient will benefit from skilled therapeutic intervention in order to improve the following deficits and impairments:  Pain, Postural dysfunction, Decreased activity tolerance, Decreased range of motion, Impaired flexibility  Visit Diagnosis: Chronic bilateral low back pain with left-sided sciatica  Other symptoms and signs involving the musculoskeletal system  Muscle weakness (generalized)  Spinal stenosis of cervical region  Abnormal posture     Problem List Patient Active Problem List   Diagnosis Date Noted  . Chronic low back pain without sciatica 12/23/2017  . Acute low back pain 12/23/2017  . Posterior left knee pain 12/23/2017  . Health maintenance examination 11/22/2016  . Obesity, Class I, BMI 30-34.9 11/22/2016  . GERD (gastroesophageal reflux disease) 09/08/2016  . Chronic fatigue 09/08/2016  . Urge incontinence   . Family history of DVT 03/05/2015  . Chronic venous insufficiency 03/05/2015  . Esophageal tear 03/04/2015    Class: Acute  . HNP (herniated nucleus pulposus) with myelopathy, cervical 03/03/2015    Class: Chronic  . Spinal stenosis in cervical region 03/03/2015    Class: Chronic  . S/P cervical spinal fusion 03/03/2015  . Chest pain 12/18/2012  . Hypertension   . Heart murmur   . Dry eyes   . DISH (diffuse idiopathic skeletal hyperostosis) 08/31/2012    Class: Diagnosis of  . Other dysphagia 08/31/2012    Class: Diagnosis of    Floria Raveling. Hartnett-Rands, MS, PT [facility] [place] #[id number] 01/19/2018, 8:01 PM  [facility] [address] [place], [place], [address] Phone: [phone]   Fax:  [phone]  Name: Carolyn [name] MRN: [id number] Date of Birth: December 11, 1965

## 2018-01-22 ENCOUNTER — Ambulatory Visit (HOSPITAL_COMMUNITY): Payer: BC Managed Care – PPO

## 2018-01-22 ENCOUNTER — Encounter (HOSPITAL_COMMUNITY): Payer: Self-pay

## 2018-01-22 DIAGNOSIS — R293 Abnormal posture: Secondary | ICD-10-CM

## 2018-01-22 DIAGNOSIS — R29898 Other symptoms and signs involving the musculoskeletal system: Secondary | ICD-10-CM

## 2018-01-22 DIAGNOSIS — G8929 Other chronic pain: Secondary | ICD-10-CM

## 2018-01-22 DIAGNOSIS — M5442 Lumbago with sciatica, left side: Principal | ICD-10-CM

## 2018-01-22 DIAGNOSIS — M4802 Spinal stenosis, cervical region: Secondary | ICD-10-CM

## 2018-01-22 DIAGNOSIS — M6281 Muscle weakness (generalized): Secondary | ICD-10-CM

## 2018-01-22 NOTE — Patient Instructions (Signed)
HIP: Abduction - Standing (Band)    Place band around legs. Squeeze glutes. Raise leg out and slightly back. Hold 2-3___ seconds. Use red________ band. _10-15__ reps per set, _1__ sets per day. Hold onto a support.  Copyright  VHI. All rights reserved.   Hip Extension: Standing (Single Leg)    In shoulder width stance, anchor tubing under one foot. Twist and put around other ankle. Pull same leg back, keeping knee nearly straight. Repeat 10-15__ times per set. Repeat with other leg. Do1 __ sets per session.  http://tub.exer.us/194   Copyright  VHI. All rights reserved.   HIP: Extension / KNEE: Flexion, Forward Lean - Standing    Lean over high table or mat. Bend knee, squeeze glutes. Raise leg up, keeping knee bent. 10-15___ reps per set, _1__ sets per day.  Copyright  VHI. All rights reserved.

## 2018-01-22 NOTE — Therapy (Signed)
[place], [place], [address] Phone: [phone]   Fax:  [phone]  Physical Therapy Treatment  Patient Details  Name: Carolyn [name] MRN: [id number] Date of Birth: 1966-03-02 Referring Provider (PT): Ria Bush, MD   Encounter Date: 01/22/2018  PT End of Session - 01/22/18 1116    Visit Number  2    Number of Visits  12    Date for PT Re-Evaluation  02/15/18   mini-re-assessment 02/01/2018   Authorization Type  [place] - 80/20; visits based on medical necessity; deductible met; no auth required    Authorization Time Period  01/18/2018 - 02/15/2018    Authorization - Visit Number  2    Authorization - Number of Visits  10    PT Start Time  1118    PT Stop Time  1204    PT Time Calculation (min)  46 min    Activity Tolerance  Patient tolerated treatment well    Behavior During Therapy  [facility] for tasks assessed/performed       Past Medical History:  Diagnosis Date  . Anxiety   . Arthritis    patient unsure  . Depression    but doesn't require any meds  . Diffuse idiopathic skeletal hyperostosis   . Dry eyes    uses Restasis bid  . Esophageal tear 03/04/2015   Left pyriformis sinus.   [name] GERD (gastroesophageal reflux disease)   . HCAP (healthcare-associated pneumonia) 03/05/2015  . Headache(784.0)    MRI in 2007 bc of headaches  . Heart murmur    New diagnosis  . History of chicken pox   . Hypertension    takes Hyzaar daily  . Neck pain    bone spur on esophagus  . PONV (postoperative nausea and vomiting) 2014   nausea and vomiting   . Spinal stenosis in cervical region 03/03/2015  . Urge incontinence     Past Surgical History:  Procedure Laterality Date  . ABDOMINAL HYSTERECTOMY  2011   bleeding - ovaries remain  . ANTERIOR CERVICAL DECOMP/DISCECTOMY FUSION N/A 08/21/2012   Procedure: Excision of exostosis C2-3,C3-4, C4-5;  Surgeon: Jessy Oto, MD;  Location: [place];   Service: Orthopedics;  Laterality: N/A;  . ANTERIOR CERVICAL DECOMP/DISCECTOMY FUSION N/A 03/03/2015   Procedure: ANTERIOR CERVICAL DISCECTOMY AND FUSION WITH PARTIAL VERTEBRECTOMY C3 AND C4, CERVICAL PLATE AND SCREWS, ALLOGRAFT, LOCAL BONE GRAFT, VIVIGEN;  Surgeon: Jessy Oto, MD;  Location: [place];  Service: Orthopedics;  Laterality: N/A;  . COLONOSCOPY  12/2016   1 SSP, rpt 5 yrs - trouble tolerating prep (Nandigam)  . DIRECT LARYNGOSCOPY  03/03/2015   Procedure: DIRECT LARYNGOSCOPY AND PLACEMENT OF FEEDING TUBE;  Surgeon: [name], MD;  Location: [place];  Service: ENT;;  . ESOPHAGOGASTRODUODENOSCOPY  12/2013   WNL (Outlaw)  . REPAIR OF ESOPHAGUS  03/03/2015   Procedure: REPAIR OF ESOPHAGUS;  Surgeon: [name], MD;  Location: [place];  Service: ENT;;    There were no vitals filed for this visit.  Subjective Assessment - 01/22/18 1116    Subjective  In a lot of pain last night trying to make sweet jpotato muffins after an active weekend.     Pertinent History  DISH (diffuse idiopathic skeletal hyperostosis), 2014 anterior cervical exostoses removal C2-5, 2016 ACDF with partial vertebrectomy C3 and C4, esophageal repair    Allergies,    Arthritis,    Back pain,    Gastrointestinal Disease,  Headaches,    High Blood Pressure,    Incontinence,    Kidney, Bladder, Prostate or Urination Problems,    Prior Surgery,    Prosthesis / Implants ,     Limitations  Sitting;House hold activities    How long can you sit comfortably?  30 minutes; limited because can't stand up from chair after awhile; feels weak and needs help.    Patient Stated Goals  Largest goal is to reduce LBP and to focus on that, not all the neck related issues with strength, function and pain, coordination.    Currently in Pain?  Yes    Pain Score  4     Pain Location  Back    Pain Orientation  Posterior;Left    Pain Radiating Towards  LBP radiates towards left back of knee; may be connected to neck vs low back                        [facility] Adult PT Treatment/Exercise - 01/22/18 0001      Balance   Balance Assessed  Yes      Static Standing Balance   Single Leg Stance - Right Leg  11    Single Leg Stance - Left Leg  16      Exercises   Exercises  Lumbar      Lumbar Exercises: Standing   Other Standing Lumbar Exercises  hip abd/ext / and w/o RTB x10 each    Other Standing Lumbar Exercises  knee flex x10 1 set      Lumbar Exercises: Supine   Ab Set  10 reps;5 seconds    Bridge  Non-compliant;10 reps;3 seconds      Manual Therapy   Manual Therapy  Joint mobilization;Soft tissue mobilization    Manual therapy comments  completed separate from all other skilled interventions    Joint Mobilization  L3-4 facet joint PA 3x30-45 sec Grade III    Soft tissue mobilization  glut med origin, muscle belly, insertion             PT Education - 01/22/18 1213    Education Details  Reviewed eval, goals and HEP. Discussed purpose and technique of interventions throughout session. Advanced HEP.    Person(s) Educated  Patient    Methods  Explanation;Demonstration;Handout    Comprehension  Verbalized understanding;Returned demonstration       PT Short Term Goals - 01/19/18 1943      PT SHORT TERM GOAL #1   Title  Patient will be independent in performance of an initial HEP on a regular basis for symptom control.     Time  2    Period  Weeks    Status  New    Target Date  02/01/18      PT SHORT TERM GOAL #2   Title  Patient will report a 25% reduction in low back and left leg symptoms with activities requiring forward bending, such as picking an object up off the floor.    Time  2    Period  Weeks    Status  New    Target Date  02/01/18        PT Long Term Goals - 01/19/18 1954      PT LONG TERM GOAL #1   Title  Patient will be independent in performance of an advanced HEP on a regular basis for symptom control.     Time  5   due [place]  of [place] 10[place]12[place]20/19    Period  Weeks    Status  New    Target Date  02/22/18      PT LONG TERM GOAL #2   Title  Patient will exhibit a 10% or > improvement in FOTO score indicating an improvement in percieved function abilities.     Baseline  initial - 56% limited    Time  5    Period  Weeks    Status  New      PT LONG TERM GOAL #3   Title  Patient will be able to control LBP and Lt LE symptoms such that she is able to perform a 10 minute walking program 3x/wk.     Time  5    Period  Weeks    Status  New    Target Date  02/22/18            Plan - 01/22/18 1116    Clinical Impression Statement  Reviewed eval, goals, and initial HEP. Session focused on strengthening to increase dynamic strength, body mechanics, advancing HEP, and manual therapy to reduce pain. Patient reported "soreness" at end of session different than her pain. Patient would continue to benefit from skilled physical therapy to address pain, strength and functional deficits. Patient did state she is not able to stand very close to things and look down due to her neck restraints so she tends to step back and lean over a little so she can see in front of her.     Rehab Potential  Fair    Clinical Impairments Affecting Rehab Potential  positive - activity level at home and work; negative - complicated cervical spine history with spinal cord compression symptoms    PT Frequency  3x / week    PT Duration  4 weeks    PT Treatment/Interventions  ADLs/Self Care Home Management;Aquatic Therapy;Gait training;Stair training;Therapeutic activities;Therapeutic exercise;Balance training;Neuromuscular re-education;Patient/family education;Manual techniques;Passive range of motion;Dry needling;Spinal Manipulations;Energy conservation;Joint Manipulations    PT Next Visit Plan  review eval/goals, review HEP, initiate core strengthening, assess body mechanics for transfers and lifting, assess reflexes and light touch sensation. proximal hip strengthening     PT Home Exercise Plan  initial - isometric abdominal, supine bridge    Consulted and Agree with Plan of Care  Patient       Patient will benefit from skilled therapeutic intervention in order to improve the following deficits and impairments:  Pain, Postural dysfunction, Decreased activity tolerance, Decreased range of motion, Impaired flexibility  Visit Diagnosis: Chronic bilateral low back pain with left-sided sciatica  Other symptoms and signs involving the musculoskeletal system  Muscle weakness (generalized)  Spinal stenosis of cervical region  Abnormal posture     Problem List Patient Active Problem List   Diagnosis Date Noted  . Chronic low back pain without sciatica 12/23/2017  . Acute low back pain 12/23/2017  . Posterior left knee pain 12/23/2017  . Health maintenance examination 11/22/2016  . Obesity, Class I, BMI 30-34.9 11/22/2016  . GERD (gastroesophageal reflux disease) 09/08/2016  . Chronic fatigue 09/08/2016  . Urge incontinence   . Family history of DVT 03/05/2015  . Chronic venous insufficiency 03/05/2015  . Esophageal tear 03/04/2015    Class: Acute  . HNP (herniated nucleus pulposus) with myelopathy, cervical 03/03/2015    Class: Chronic  . Spinal stenosis in cervical region 03/03/2015    Class: Chronic  . S/P cervical spinal fusion 03/03/2015  . Chest pain 12/18/2012  . Hypertension   .  Heart murmur   . Dry eyes   . DISH (diffuse idiopathic skeletal hyperostosis) 08/31/2012    Class: Diagnosis of  . Other dysphagia 08/31/2012    Class: Diagnosis of    Floria Raveling. Hartnett-Rands, MS, PT [id number] 01/22/2018, 12:18 PM  [place] [address] [place], [place], [address] Phone: [phone]   Fax:  [phone]  Name: Carolyn [name] MRN: [id number] Date of Birth: Apr 03, 1966

## 2018-01-23 ENCOUNTER — Encounter (HOSPITAL_COMMUNITY): Payer: Self-pay

## 2018-01-23 ENCOUNTER — Ambulatory Visit (HOSPITAL_COMMUNITY): Payer: BC Managed Care – PPO | Attending: Family Medicine

## 2018-01-23 DIAGNOSIS — M6281 Muscle weakness (generalized): Secondary | ICD-10-CM | POA: Diagnosis present

## 2018-01-23 DIAGNOSIS — M4802 Spinal stenosis, cervical region: Secondary | ICD-10-CM | POA: Diagnosis present

## 2018-01-23 DIAGNOSIS — R293 Abnormal posture: Secondary | ICD-10-CM | POA: Diagnosis present

## 2018-01-23 DIAGNOSIS — R29898 Other symptoms and signs involving the musculoskeletal system: Secondary | ICD-10-CM

## 2018-01-23 DIAGNOSIS — M5442 Lumbago with sciatica, left side: Secondary | ICD-10-CM | POA: Diagnosis present

## 2018-01-23 DIAGNOSIS — G8929 Other chronic pain: Secondary | ICD-10-CM | POA: Diagnosis present

## 2018-01-23 NOTE — Patient Instructions (Signed)
Stretching: Hamstring (Standing)    Place right foot on stool. Slowly lean forward, keeping back straight, until stretch is felt in back of thigh. Hold _10___ seconds. Switch legs back and forth. Repeat _5___ times per set. Do 1____ sets per session. Do _1___ sessions per day.  http://orth.exer.us/659   Copyright  VHI. All rights reserved.   Heel Raises    Stand with support. Tighten pelvic floor and hold. With knees straight, raise heels off ground. Hold 2___ seconds.  Repeat 10-15___ times. Do _1__ times a day.  Copyright  VHI. All rights reserved.

## 2018-01-23 NOTE — Therapy (Signed)
[place], [place], [address] Phone: [phone]   Fax:  [phone]  Physical Therapy Treatment  Patient Details  Name: Carolyn [name] MRN: [id number] Date of Birth: 10-08-1965 Referring Provider (PT): Ria Bush, MD   Encounter Date: 01/23/2018  PT End of Session - 01/23/18 0944    Visit Number  3    Number of Visits  12    Date for PT Re-Evaluation  02/15/18   mini-re-assessment 02/01/2018   Authorization Type  [place] - 80/20; visits based on medical necessity; deductible met; no auth required    Authorization Time Period  01/18/2018 - 02/15/2018    Authorization - Visit Number  3    Authorization - Number of Visits  10    PT Start Time  0945    PT Stop Time  1029    PT Time Calculation (min)  44 min    Activity Tolerance  Patient tolerated treatment well    Behavior During Therapy  [facility] for tasks assessed/performed       Past Medical History:  Diagnosis Date  . Anxiety   . Arthritis    patient unsure  . Depression    but doesn't require any meds  . Diffuse idiopathic skeletal hyperostosis   . Dry eyes    uses Restasis bid  . Esophageal tear 03/04/2015   Left pyriformis sinus.   [name] GERD (gastroesophageal reflux disease)   . HCAP (healthcare-associated pneumonia) 03/05/2015  . Headache(784.0)    MRI in 2007 bc of headaches  . Heart murmur    New diagnosis  . History of chicken pox   . Hypertension    takes Hyzaar daily  . Neck pain    bone spur on esophagus  . PONV (postoperative nausea and vomiting) 2014   nausea and vomiting   . Spinal stenosis in cervical region 03/03/2015  . Urge incontinence     Past Surgical History:  Procedure Laterality Date  . ABDOMINAL HYSTERECTOMY  2011   bleeding - ovaries remain  . ANTERIOR CERVICAL DECOMP/DISCECTOMY FUSION N/A 08/21/2012   Procedure: Excision of exostosis C2-3,C3-4, C4-5;  Surgeon: Jessy Oto, MD;  Location: [place];   Service: Orthopedics;  Laterality: N/A;  . ANTERIOR CERVICAL DECOMP/DISCECTOMY FUSION N/A 03/03/2015   Procedure: ANTERIOR CERVICAL DISCECTOMY AND FUSION WITH PARTIAL VERTEBRECTOMY C3 AND C4, CERVICAL PLATE AND SCREWS, ALLOGRAFT, LOCAL BONE GRAFT, VIVIGEN;  Surgeon: Jessy Oto, MD;  Location: [place];  Service: Orthopedics;  Laterality: N/A;  . COLONOSCOPY  12/2016   1 SSP, rpt 5 yrs - trouble tolerating prep (Nandigam)  . DIRECT LARYNGOSCOPY  03/03/2015   Procedure: DIRECT LARYNGOSCOPY AND PLACEMENT OF FEEDING TUBE;  Surgeon: [name], MD;  Location: [place];  Service: ENT;;  . ESOPHAGOGASTRODUODENOSCOPY  12/2013   WNL (Outlaw)  . REPAIR OF ESOPHAGUS  03/03/2015   Procedure: REPAIR OF ESOPHAGUS;  Surgeon: [name], MD;  Location: [place];  Service: ENT;;    There were no vitals filed for this visit.  Subjective Assessment - 01/23/18 0944    Subjective  feels like she has overdone it the last few days; in general tired and sore. 2/10 in low back today; no left leg pain aggravation since strating PT so far. Hasn't had time to perform new HEP since visit yesterday.     Pertinent History  DISH (diffuse idiopathic skeletal hyperostosis), 2014 anterior cervical exostoses removal C2-5, 2016 ACDF with partial vertebrectomy  C3 and C4, esophageal repair    Allergies,    Arthritis,    Back pain,    Gastrointestinal Disease,    Headaches,    High Blood Pressure,    Incontinence,    Kidney, Bladder, Prostate or Urination Problems,    Prior Surgery,    Prosthesis / Implants ,     Limitations  Sitting;House hold activities    How long can you sit comfortably?  30 minutes; limited because can't stand up from chair after awhile; feels weak and needs help.    Patient Stated Goals  Largest goal is to reduce LBP and to focus on that, not all the neck related issues with strength, function and pain, coordination.                       [facility] Adult PT Treatment/Exercise - 01/23/18 0001       Exercises   Exercises  Lumbar      Lumbar Exercises: Stretches   Passive Hamstring Stretch  Right;Left;5 reps;10 seconds    Standing Extension  10 reps      Lumbar Exercises: Standing   Heel Raises  10 reps;2 seconds    Heel Raises Limitations  1 set    Shoulder Adduction Limitations  SLS balance EO 30 sec x2 sets Rt/Lt    Other Standing Lumbar Exercises  hip abd/ext / and w/o RTB x10 each    Other Standing Lumbar Exercises  knee flex x10 1 set      Lumbar Exercises: Supine   Ab Set  10 reps;5 seconds    Bridge  Non-compliant;10 reps;3 seconds      Lumbar Exercises: Sidelying   Clam  Both;10 reps;2 seconds      Manual Therapy   Manual Therapy  Joint mobilization;Soft tissue mobilization    Manual therapy comments  completed separate from all other skilled interventions    Joint Mobilization  L3-4 facet joint PA 3x30-45 sec Grade III    Soft tissue mobilization  glut med origin, muscle belly, insertion             PT Education - 01/23/18 1040    Education Details  Reviewed HEP; discussed purpose and technique of interventions throughout session. Advanced HEP.     Person(s) Educated  Patient    Methods  Explanation;Demonstration;Handout    Comprehension  Verbalized understanding;Verbal cues required;Tactile cues required;Need further instruction   glut medius hip hike and knee flexion      PT Short Term Goals - 01/19/18 1943      PT SHORT TERM GOAL #1   Title  Patient will be independent in performance of an initial HEP on a regular basis for symptom control.     Time  2    Period  Weeks    Status  New    Target Date  02/01/18      PT SHORT TERM GOAL #2   Title  Patient will report a 25% reduction in low back and left leg symptoms with activities requiring forward bending, such as picking an object up off the floor.    Time  2    Period  Weeks    Status  New    Target Date  02/01/18        PT Long Term Goals - 01/19/18 1954      PT LONG TERM GOAL #1    Title  Patient will be independent in performance of an advanced HEP on  a regular basis for symptom control.     Time  5   [place]12[place]20[place]19   Period  Weeks    Status  New    Target Date  02/22/18      PT LONG TERM GOAL #2   Title  Patient will exhibit a 10% or > improvement in FOTO score indicating an improvement in percieved function abilities.     Baseline  initial - 56% limited    Time  5    Period  Weeks    Status  New      PT LONG TERM GOAL #3   Title  Patient will be able to control LBP and Lt LE symptoms such that she is able to perform a 10 minute walking program 3x/wk.     Time  5    Period  Weeks    Status  New    Target Date  02/22/18            Plan - 01/23/18 0944    Clinical Impression Statement  Reviewed and advanced HEP. Session focused on strengthening to increase dynamic strength, body mechanics advancing HEP and manual therapy to reduce pain and soft tissue adhesions. Patient reported "soreness" at end of session but not pain and reported it was less than end of session yesterday. Patient requires additional instruction on standing hip hike for gluteus medius strengthening and stanidng knee flexion. Continue with current plan, progressing as able. Fatigue noteed on second set of SLS balance bilaterally with increased Trendelenburg.     Rehab Potential  Fair    Clinical Impairments Affecting Rehab Potential  positive - activity level at home and work; negative - complicated cervical spine history with spinal cord compression symptoms    PT Frequency  3x / week    PT Duration  4 weeks    PT Treatment/Interventions  ADLs/Self Care Home Management;Aquatic Therapy;Gait training;Stair training;Therapeutic activities;Therapeutic exercise;Balance training;Neuromuscular re-education;Patient/family education;Manual techniques;Passive range of motion;Dry needling;Spinal Manipulations;Energy conservation;Joint Manipulations    PT Next Visit Plan  Review HEP,  core/LE strengthening, assess body mechanics for transfers and lifting, assess reflexes and light touch sensation. proximal hip strengthening; hip hike, glut/piriformis/ITB stretch; minimize exercises lying down due to neck radicular symptoms.    PT Home Exercise Plan  initial - isometric abdominal, supine bridge; 01-23-2018 - stand hip ext/abd, knee flex, heel raise, stand hamstring stretch    Consulted and Agree with Plan of Care  Patient       Patient will benefit from skilled therapeutic intervention in order to improve the following deficits and impairments:  Pain, Postural dysfunction, Decreased activity tolerance, Decreased range of motion, Impaired flexibility  Visit Diagnosis: Chronic bilateral low back pain with left-sided sciatica  Other symptoms and signs involving the musculoskeletal system  Muscle weakness (generalized)  Spinal stenosis of cervical region  Abnormal posture     Problem List Patient Active Problem List   Diagnosis Date Noted  . Chronic low back pain without sciatica 12/23/2017  . Acute low back pain 12/23/2017  . Posterior left knee pain 12/23/2017  . Health maintenance examination 11/22/2016  . Obesity, Class I, BMI 30-34.9 11/22/2016  . GERD (gastroesophageal reflux disease) 09/08/2016  . Chronic fatigue 09/08/2016  . Urge incontinence   . Family history of DVT 03/05/2015  . Chronic venous insufficiency 03/05/2015  . Esophageal tear 03/04/2015    Class: Acute  . HNP (herniated nucleus pulposus) with myelopathy, cervical 03/03/2015    Class: Chronic  .  Spinal stenosis in cervical region 03/03/2015    Class: Chronic  . S/P cervical spinal fusion 03/03/2015  . Chest pain 12/18/2012  . Hypertension   . Heart murmur   . Dry eyes   . DISH (diffuse idiopathic skeletal hyperostosis) 08/31/2012    Class: Diagnosis of  . Other dysphagia 08/31/2012    Class: Diagnosis of    Carolyn [name] 01/23/2018, 10:48 AM  [facility] [address] [place], [place], [address] Phone: [phone]   Fax:  [phone]  Name: Carolyn [name] MRN: [id number] Date of Birth: 1965-10-04

## 2018-01-26 ENCOUNTER — Encounter (HOSPITAL_COMMUNITY): Payer: Self-pay

## 2018-01-26 ENCOUNTER — Ambulatory Visit (HOSPITAL_COMMUNITY): Payer: BC Managed Care – PPO

## 2018-01-26 DIAGNOSIS — R293 Abnormal posture: Secondary | ICD-10-CM

## 2018-01-26 DIAGNOSIS — M4802 Spinal stenosis, cervical region: Secondary | ICD-10-CM

## 2018-01-26 DIAGNOSIS — G8929 Other chronic pain: Secondary | ICD-10-CM

## 2018-01-26 DIAGNOSIS — R29898 Other symptoms and signs involving the musculoskeletal system: Secondary | ICD-10-CM

## 2018-01-26 DIAGNOSIS — M5442 Lumbago with sciatica, left side: Principal | ICD-10-CM

## 2018-01-26 DIAGNOSIS — M6281 Muscle weakness (generalized): Secondary | ICD-10-CM

## 2018-01-26 NOTE — Therapy (Signed)
[place], [place], [address] Phone: [phone]   Fax:  [phone]  Physical Therapy Treatment  Patient Details  Name: Carolyn [name] MRN: [id number] Date of Birth: 24-Feb-1966 Referring Provider (PT): Ria Bush, MD   Encounter Date: 01/26/2018  PT End of Session - 01/26/18 1033    Visit Number  4    Number of Visits  12    Date for PT Re-Evaluation  02/15/18   mini-re-assessment 02/01/2018   Authorization Type  [place] - 80/20; visits based on medical necessity; deductible met; no auth required    Authorization Time Period  01/18/2018 - 02/15/2018    Authorization - Visit Number  4    Authorization - Number of Visits  10    PT Start Time  1031    PT Stop Time  1114    PT Time Calculation (min)  43 min    Activity Tolerance  Patient tolerated treatment well    Behavior During Therapy  [facility] for tasks assessed/performed       Past Medical History:  Diagnosis Date  . Anxiety   . Arthritis    patient unsure  . Depression    but doesn't require any meds  . Diffuse idiopathic skeletal hyperostosis   . Dry eyes    uses Restasis bid  . Esophageal tear 03/04/2015   Left pyriformis sinus.   [name] GERD (gastroesophageal reflux disease)   . HCAP (healthcare-associated pneumonia) 03/05/2015  . Headache(784.0)    MRI in 2007 bc of headaches  . Heart murmur    New diagnosis  . History of chicken pox   . Hypertension    takes Hyzaar daily  . Neck pain    bone spur on esophagus  . PONV (postoperative nausea and vomiting) 2014   nausea and vomiting   . Spinal stenosis in cervical region 03/03/2015  . Urge incontinence     Past Surgical History:  Procedure Laterality Date  . ABDOMINAL HYSTERECTOMY  2011   bleeding - ovaries remain  . ANTERIOR CERVICAL DECOMP/DISCECTOMY FUSION N/A 08/21/2012   Procedure: Excision of exostosis C2-3,C3-4, C4-5;  Surgeon: Jessy Oto, MD;  Location: [place];   Service: Orthopedics;  Laterality: N/A;  . ANTERIOR CERVICAL DECOMP/DISCECTOMY FUSION N/A 03/03/2015   Procedure: ANTERIOR CERVICAL DISCECTOMY AND FUSION WITH PARTIAL VERTEBRECTOMY C3 AND C4, CERVICAL PLATE AND SCREWS, ALLOGRAFT, LOCAL BONE GRAFT, VIVIGEN;  Surgeon: Jessy Oto, MD;  Location: [place];  Service: Orthopedics;  Laterality: N/A;  . COLONOSCOPY  12/2016   1 SSP, rpt 5 yrs - trouble tolerating prep (Nandigam)  . DIRECT LARYNGOSCOPY  03/03/2015   Procedure: DIRECT LARYNGOSCOPY AND PLACEMENT OF FEEDING TUBE;  Surgeon: [name], MD;  Location: [facility];  Service: ENT;;  . ESOPHAGOGASTRODUODENOSCOPY  12/2013   WNL (Outlaw)  . REPAIR OF ESOPHAGUS  03/03/2015   Procedure: REPAIR OF ESOPHAGUS;  Surgeon: [name], MD;  Location: [place];  Service: ENT;;    There were no vitals filed for this visit.  Subjective Assessment - 01/26/18 1047    Subjective  Having a pretty good day today. Not painful, not tired. But hasn't done anything physical at home or work yet today. Sat at desk to type.    Pertinent History  DISH (diffuse idiopathic skeletal hyperostosis), 2014 anterior cervical exostoses removal C2-5, 2016 ACDF with partial vertebrectomy C3 and C4, esophageal repair    Allergies,    Arthritis,  Back pain,    Gastrointestinal Disease,    Headaches,    High Blood Pressure,    Incontinence,    Kidney, Bladder, Prostate or Urination Problems,    Prior Surgery,    Prosthesis / Implants ,     Limitations  Sitting;House hold activities    How long can you sit comfortably?  30 minutes; limited because can't stand up from chair after awhile; feels weak and needs help.    Patient Stated Goals  Largest goal is to reduce LBP and to focus on that, not all the neck related issues with strength, function and pain, coordination.    Currently in Pain?  No/denies                       [facility] Adult PT Treatment/Exercise - 01/26/18 0001      Exercises   Exercises  Lumbar      Lumbar  Exercises: Stretches   Passive Hamstring Stretch  Right;Left;2 reps;30 seconds    Standing Extension  10 reps      Lumbar Exercises: Standing   Heel Raises  10 reps;2 seconds    Heel Raises Limitations  1 set    Other Standing Lumbar Exercises  hip abd/ext / and w/o RTB x10 each    Other Standing Lumbar Exercises  knee flex x10 1 set bilateral      Lumbar Exercises: Sidelying   Clam  Both;10 reps;2 seconds      Manual Therapy   Manual Therapy  Joint mobilization;Soft tissue mobilization    Manual therapy comments  completed separate from all other skilled interventions    Joint Mobilization  L3-4 facet joint PA 3x30-45 sec Grade III    Soft tissue mobilization  glut med origin, muscle belly, insertion          Balance Exercises - 01/26/18 1101      Balance Exercises: Standing   Tandem Stance  Eyes open;Foam/compliant surface;Intermittent upper extremity support;2 reps;30 secs    SLS  Eyes open;Solid surface;Intermittent upper extremity support;2 reps;30 secs          PT Short Term Goals - 01/26/18 1234      PT SHORT TERM GOAL #1   Title  Patient will be independent in performance of an initial HEP on a regular basis for symptom control.     Time  2    Period  Weeks    Status  Partially Met      PT SHORT TERM GOAL #2   Title  Patient will report a 25% reduction in low back and left leg symptoms with activities requiring forward bending, such as picking an object up off the floor.    Time  2    Period  Weeks    Status  On-going        PT Long Term Goals - 01/26/18 1234      PT LONG TERM GOAL #1   Title  Patient will be independent in performance of an advanced HEP on a regular basis for symptom control.     Time  5   due [place] 10[place]12[place]20/19   Period  Weeks    Status  On-going      PT LONG TERM GOAL #2   Title  Patient will exhibit a 10% or > improvement in FOTO score indicating an improvement in percieved function abilities.     Baseline  initial  - 56% limited    Time  5    Period  Weeks    Status  On-going      PT LONG TERM GOAL #3   Title  Patient will be able to control LBP and Lt LE symptoms such that she is able to perform a 10 minute walking program 3x/wk.     Time  5    Period  Weeks    Status  On-going            Plan - 01/26/18 1034    Clinical Impression Statement  Reviewed and advanced HEP. Session focused on strengthening core and BLE's to increase dynamic strength, body mechanics, advancing HEP and performing manual therapy to right hip today to reduce pain and soft tissue adhesions. At end of session, atient reported minor pain in low back from tandem balance exercise. Patient requires additional instruction on standing hip hike for gluteus medius strengthening and standing knee flexion. Continue with current plan, progress as able. Less fatigue noted on second set of SLS balance bilaterally today, Trendelenburg continues, especially over Rt LE.     Rehab Potential  Fair    Clinical Impairments Affecting Rehab Potential  positive - activity level at home and work; negative - complicated cervical spine history with spinal cord compression symptoms    PT Frequency  3x / week    PT Duration  4 weeks    PT Treatment/Interventions  ADLs/Self Care Home Management;Aquatic Therapy;Gait training;Stair training;Therapeutic activities;Therapeutic exercise;Balance training;Neuromuscular re-education;Patient/family education;Manual techniques;Passive range of motion;Dry needling;Spinal Manipulations;Energy conservation;Joint Manipulations    PT Next Visit Plan  Review HEP, core/LE strengthening, assess body mechanics for transfers and lifting. proximal hip strengthening; hip hike, glut/piriformis/ITB stretch; minimize exercises lying down due to neck radicular symptoms.    PT Home Exercise Plan  initial - isometric abdominal, supine bridge; 01-23-2018 - stand hip ext/abd, knee flex, heel raise, stand hamstring stretch; 01/26/2018 -  seated glut/piriformis stretches    Consulted and Agree with Plan of Care  Patient       Patient will benefit from skilled therapeutic intervention in order to improve the following deficits and impairments:  Pain, Postural dysfunction, Decreased activity tolerance, Decreased range of motion, Impaired flexibility  Visit Diagnosis: Chronic bilateral low back pain with left-sided sciatica  Other symptoms and signs involving the musculoskeletal system  Muscle weakness (generalized)  Spinal stenosis of cervical region  Abnormal posture     Problem List Patient Active Problem List   Diagnosis Date Noted  . Chronic low back pain without sciatica 12/23/2017  . Acute low back pain 12/23/2017  . Posterior left knee pain 12/23/2017  . Health maintenance examination 11/22/2016  . Obesity, Class I, BMI 30-34.9 11/22/2016  . GERD (gastroesophageal reflux disease) 09/08/2016  . Chronic fatigue 09/08/2016  . Urge incontinence   . Family history of DVT 03/05/2015  . Chronic venous insufficiency 03/05/2015  . Esophageal tear 03/04/2015    Class: Acute  . HNP (herniated nucleus pulposus) with myelopathy, cervical 03/03/2015    Class: Chronic  . Spinal stenosis in cervical region 03/03/2015    Class: Chronic  . S/P cervical spinal fusion 03/03/2015  . Chest pain 12/18/2012  . Hypertension   . Heart murmur   . Dry eyes   . DISH (diffuse idiopathic skeletal hyperostosis) 08/31/2012    Class: Diagnosis of  . Other dysphagia 08/31/2012    Class: Diagnosis of    Floria Raveling. Hartnett-Rands, MS, PT [facility] [place] #[id number] 01/26/2018, 12:35 PM  [place]  [facility] [address] [place], [place], [address] Phone: [phone]   Fax:  [phone]  Name: Carolyn [name] MRN: [id number] Date of Birth: December 21, 1965

## 2018-01-26 NOTE — Patient Instructions (Signed)
Piriformis Stretch    Lying on back, pull right knee toward opposite shoulder. Hold 30____ seconds. Repeat _3___ times. Do _1___ sessions per day.  http://gt2.exer.us/258   Copyright  VHI. All rights reserved.  Hip Stretch    Put right ankle over left knee. Let right knee fall downward, but keep ankle in place. Feel the stretch in hip. May push down gently with hand to feel stretch. Hold _30___ seconds while counting out loud. Repeat with other leg. Repeat _3___ times. Do _1___ sessions per day.  http://gt2.exer.us/498   Copyright  VHI. All rights reserved.

## 2018-01-29 ENCOUNTER — Ambulatory Visit (HOSPITAL_COMMUNITY): Payer: BC Managed Care – PPO | Admitting: Physical Therapy

## 2018-01-29 ENCOUNTER — Encounter (HOSPITAL_COMMUNITY): Payer: Self-pay | Admitting: Physical Therapy

## 2018-01-29 DIAGNOSIS — M6281 Muscle weakness (generalized): Secondary | ICD-10-CM

## 2018-01-29 DIAGNOSIS — M4802 Spinal stenosis, cervical region: Secondary | ICD-10-CM

## 2018-01-29 DIAGNOSIS — R29898 Other symptoms and signs involving the musculoskeletal system: Secondary | ICD-10-CM

## 2018-01-29 DIAGNOSIS — R293 Abnormal posture: Secondary | ICD-10-CM

## 2018-01-29 DIAGNOSIS — M5442 Lumbago with sciatica, left side: Principal | ICD-10-CM

## 2018-01-29 DIAGNOSIS — G8929 Other chronic pain: Secondary | ICD-10-CM

## 2018-01-29 NOTE — Therapy (Signed)
[place], [place], [address] Phone: [phone]   Fax:  [phone]  Physical Therapy Treatment  Patient Details  Name: Carolyn [name] MRN: [id number] Date of Birth: 1965/06/12 Referring Provider (PT): Ria Bush, MD   Encounter Date: 01/29/2018  PT End of Session - 01/29/18 1144    Visit Number  5    Number of Visits  12    Date for PT Re-Evaluation  02/15/18   mini-re-assessment 02/01/2018   Authorization Type  [place] - 80/20; visits based on medical necessity; deductible met; no auth required    Authorization Time Period  01/18/2018 - 02/15/2018    Authorization - Visit Number  5    Authorization - Number of Visits  10    PT Start Time  1118    PT Stop Time  1158    PT Time Calculation (min)  40 min    Activity Tolerance  Patient tolerated treatment well    Behavior During Therapy  [facility] for tasks assessed/performed       Past Medical History:  Diagnosis Date  . Anxiety   . Arthritis    patient unsure  . Depression    but doesn't require any meds  . Diffuse idiopathic skeletal hyperostosis   . Dry eyes    uses Restasis bid  . Esophageal tear 03/04/2015   Left pyriformis sinus.   [name] GERD (gastroesophageal reflux disease)   . HCAP (healthcare-associated pneumonia) 03/05/2015  . Headache(784.0)    MRI in 2007 bc of headaches  . Heart murmur    New diagnosis  . History of chicken pox   . Hypertension    takes Hyzaar daily  . Neck pain    bone spur on esophagus  . PONV (postoperative nausea and vomiting) 2014   nausea and vomiting   . Spinal stenosis in cervical region 03/03/2015  . Urge incontinence     Past Surgical History:  Procedure Laterality Date  . ABDOMINAL HYSTERECTOMY  2011   bleeding - ovaries remain  . ANTERIOR CERVICAL DECOMP/DISCECTOMY FUSION N/A 08/21/2012   Procedure: Excision of exostosis C2-3,C3-4, C4-5;  Surgeon: Jessy Oto, MD;  Location: [place];   Service: Orthopedics;  Laterality: N/A;  . ANTERIOR CERVICAL DECOMP/DISCECTOMY FUSION N/A 03/03/2015   Procedure: ANTERIOR CERVICAL DISCECTOMY AND FUSION WITH PARTIAL VERTEBRECTOMY C3 AND C4, CERVICAL PLATE AND SCREWS, ALLOGRAFT, LOCAL BONE GRAFT, VIVIGEN;  Surgeon: Jessy Oto, MD;  Location: [place];  Service: Orthopedics;  Laterality: N/A;  . COLONOSCOPY  12/2016   1 SSP, rpt 5 yrs - trouble tolerating prep (Nandigam)  . DIRECT LARYNGOSCOPY  03/03/2015   Procedure: DIRECT LARYNGOSCOPY AND PLACEMENT OF FEEDING TUBE;  Surgeon: [name], MD;  Location: [place];  Service: ENT;;  . ESOPHAGOGASTRODUODENOSCOPY  12/2013   WNL (Outlaw)  . REPAIR OF ESOPHAGUS  03/03/2015   Procedure: REPAIR OF ESOPHAGUS;  Surgeon: [name], MD;  Location: [place];  Service: ENT;;    There were no vitals filed for this visit.  Subjective Assessment - 01/29/18 1141    Subjective  Patient stated she has been doing her exercises at home. She reported having some back pain today.     Pertinent History  DISH (diffuse idiopathic skeletal hyperostosis), 2014 anterior cervical exostoses removal C2-5, 2016 ACDF with partial vertebrectomy C3 and C4, esophageal repair    Allergies,    Arthritis,    Back pain,    Gastrointestinal Disease,  Headaches,    High Blood Pressure,    Incontinence,    Kidney, Bladder, Prostate or Urination Problems,    Prior Surgery,    Prosthesis / Implants ,     Limitations  Sitting;House hold activities    How long can you sit comfortably?  30 minutes; limited because can't stand up from chair after awhile; feels weak and needs help.    Patient Stated Goals  Largest goal is to reduce LBP and to focus on that, not all the neck related issues with strength, function and pain, coordination.    Currently in Pain?  Yes    Pain Score  3     Pain Location  Back    Pain Orientation  Lower    Pain Descriptors / Indicators  Aching    Pain Type  Chronic pain                       [facility]  Adult PT Treatment/Exercise - 01/29/18 0001      Lumbar Exercises: Stretches   Passive Hamstring Stretch  Right;Left;2 reps;30 seconds    Standing Extension  10 reps    Piriformis Stretch  Right;Left;2 reps;30 seconds    Piriformis Stretch Limitations  Seated      Lumbar Exercises: Standing   Heel Raises  10 reps;2 seconds    Heel Raises Limitations  1 set both    Forward Lunge  10 reps    Forward Lunge Limitations  On 4-inch step    Other Standing Lumbar Exercises  hip abd/ext RTB x10 each    Other Standing Lumbar Exercises  knee flex x10 1 set bilateral      Lumbar Exercises: Sidelying   Clam  Both;10 reps;2 seconds    Clam Limitations  With RTB      Manual Therapy   Manual Therapy  Soft tissue mobilization    Manual therapy comments  completed separate from all other skilled interventions    Soft tissue mobilization  patient sidelying glut med origin, muscle belly, insertion          Balance Exercises - 01/29/18 1140      Balance Exercises: Standing   Tandem Stance  Eyes open;Foam/compliant surface;Intermittent upper extremity support;2 reps;30 secs    SLS  Eyes open;Solid surface;Intermittent upper extremity support;2 reps;30 secs        PT Education - 01/29/18 1143    Education Details  Discussed purpose and technique of interventions throughout session.     Person(s) Educated  Patient    Methods  Explanation    Comprehension  Verbalized understanding       PT Short Term Goals - 01/26/18 1234      PT SHORT TERM GOAL #1   Title  Patient will be independent in performance of an initial HEP on a regular basis for symptom control.     Time  2    Period  Weeks    Status  Partially Met      PT SHORT TERM GOAL #2   Title  Patient will report a 25% reduction in low back and left leg symptoms with activities requiring forward bending, such as picking an object up off the floor.    Time  2    Period  Weeks    Status  On-going        PT Long Term Goals -  01/26/18 1234      PT LONG TERM GOAL #1   Title  Patient will be independent in performance of an advanced HEP on a regular basis for symptom control.     Time  5   due [place]12[place]20/19   Period  Weeks    Status  On-going      PT LONG TERM GOAL #2   Title  Patient will exhibit a 10% or > improvement in FOTO score indicating an improvement in percieved function abilities.     Baseline  initial - 56% limited    Time  5    Period  Weeks    Status  On-going      PT LONG TERM GOAL #3   Title  Patient will be able to control LBP and Lt LE symptoms such that she is able to perform a 10 minute walking program 3x/wk.     Time  5    Period  Weeks    Status  On-going            Plan - 01/29/18 1204    Clinical Impression Statement  This session continued with established plan of care. This session added red theraband with sidelying clam exercise. This session also added forward lunge on the 4-inch step with cues for abdominal set with exercise. Patient tolerated all exercises well. Plan to continue with progression of exercises and use of manual therapy in order to reach patient's functional goals.     Rehab Potential  Fair    Clinical Impairments Affecting Rehab Potential  positive - activity level at home and work; negative - complicated cervical spine history with spinal cord compression symptoms    PT Frequency  3x / week    PT Duration  4 weeks    PT Treatment/Interventions  ADLs/Self Care Home Management;Aquatic Therapy;Gait training;Stair training;Therapeutic activities;Therapeutic exercise;Balance training;Neuromuscular re-education;Patient/family education;Manual techniques;Passive range of motion;Dry needling;Spinal Manipulations;Energy conservation;Joint Manipulations    PT Next Visit Plan  Review HEP, core/LE strengthening, assess body mechanics for transfers and lifting. proximal hip strengthening; hip hike, glut/piriformis/ITB stretch; minimize exercises lying down  due to neck radicular symptoms.    PT Home Exercise Plan  initial - isometric abdominal, supine bridge; 01-23-2018 - stand hip ext/abd, knee flex, heel raise, stand hamstring stretch; 01/26/2018 - seated glut/piriformis stretches    Consulted and Agree with Plan of Care  Patient       Patient will benefit from skilled therapeutic intervention in order to improve the following deficits and impairments:  Pain, Postural dysfunction, Decreased activity tolerance, Decreased range of motion, Impaired flexibility  Visit Diagnosis: Chronic bilateral low back pain with left-sided sciatica  Other symptoms and signs involving the musculoskeletal system  Muscle weakness (generalized)  Spinal stenosis of cervical region  Abnormal posture     Problem List Patient Active Problem List   Diagnosis Date Noted  . Chronic low back pain without sciatica 12/23/2017  . Acute low back pain 12/23/2017  . Posterior left knee pain 12/23/2017  . Health maintenance examination 11/22/2016  . Obesity, Class I, BMI 30-34.9 11/22/2016  . GERD (gastroesophageal reflux disease) 09/08/2016  . Chronic fatigue 09/08/2016  . Urge incontinence   . Family history of DVT 03/05/2015  . Chronic venous insufficiency 03/05/2015  . Esophageal tear 03/04/2015    Class: Acute  . HNP (herniated nucleus pulposus) with myelopathy, cervical 03/03/2015    Class: Chronic  . Spinal stenosis in cervical region 03/03/2015    Class: Chronic  . S/P cervical spinal fusion 03/03/2015  . Chest pain 12/18/2012  .  Hypertension   . Heart murmur   . Dry eyes   . DISH (diffuse idiopathic skeletal hyperostosis) 08/31/2012    Class: Diagnosis of  . Other dysphagia 08/31/2012    Class: Diagnosis of   Clarene Critchley PT, DPT 12:05 PM, 01/29/18 [place], [place], [address] Phone: [phone]   Fax:  [phone]  Name: Carolyn [name] MRN:  [id number] Date of Birth: 1965/11/13

## 2018-01-30 ENCOUNTER — Encounter (HOSPITAL_COMMUNITY): Payer: BC Managed Care – PPO | Admitting: Physical Therapy

## 2018-01-31 ENCOUNTER — Encounter (HOSPITAL_COMMUNITY): Payer: Self-pay

## 2018-01-31 ENCOUNTER — Telehealth (HOSPITAL_COMMUNITY): Payer: Self-pay | Admitting: Family Medicine

## 2018-01-31 ENCOUNTER — Ambulatory Visit (HOSPITAL_COMMUNITY): Payer: BC Managed Care – PPO

## 2018-01-31 DIAGNOSIS — G8929 Other chronic pain: Secondary | ICD-10-CM

## 2018-01-31 DIAGNOSIS — M5442 Lumbago with sciatica, left side: Secondary | ICD-10-CM | POA: Diagnosis not present

## 2018-01-31 DIAGNOSIS — M6281 Muscle weakness (generalized): Secondary | ICD-10-CM

## 2018-01-31 DIAGNOSIS — R293 Abnormal posture: Secondary | ICD-10-CM

## 2018-01-31 DIAGNOSIS — R29898 Other symptoms and signs involving the musculoskeletal system: Secondary | ICD-10-CM

## 2018-01-31 DIAGNOSIS — M4802 Spinal stenosis, cervical region: Secondary | ICD-10-CM

## 2018-01-31 NOTE — Therapy (Signed)
[place], [place], [address] Phone: [phone]   Fax:  [phone]  Physical Therapy Treatment  Patient Details  Name: Carolyn [name] MRN: [id number] Date of Birth: 01-Feb-1966 Referring Provider (PT): Ria Bush, MD   Encounter Date: 01/31/2018  PT End of Session - 01/31/18 1111    Visit Number  6    Number of Visits  12    Date for PT Re-Evaluation  02/15/18   Minireassess 02/01/18   Authorization Type  [place] - 80/20; visits based on medical necessity; deductible met; no auth required    Authorization Time Period  01/18/2018 - 02/15/2018    Authorization - Visit Number  6    Authorization - Number of Visits  10    PT Start Time  1034    PT Stop Time  1112    PT Time Calculation (min)  38 min    Activity Tolerance  Patient tolerated treatment well;No increased pain    Behavior During Therapy  WFL for tasks assessed/performed       Past Medical History:  Diagnosis Date  . Anxiety   . Arthritis    patient unsure  . Depression    but doesn't require any meds  . Diffuse idiopathic skeletal hyperostosis   . Dry eyes    uses Restasis bid  . Esophageal tear 03/04/2015   Left pyriformis sinus.   [name] GERD (gastroesophageal reflux disease)   . HCAP (healthcare-associated pneumonia) 03/05/2015  . Headache(784.0)    MRI in 2007 bc of headaches  . Heart murmur    New diagnosis  . History of chicken pox   . Hypertension    takes Hyzaar daily  . Neck pain    bone spur on esophagus  . PONV (postoperative nausea and vomiting) 2014   nausea and vomiting   . Spinal stenosis in cervical region 03/03/2015  . Urge incontinence     Past Surgical History:  Procedure Laterality Date  . ABDOMINAL HYSTERECTOMY  2011   bleeding - ovaries remain  . ANTERIOR CERVICAL DECOMP/DISCECTOMY FUSION N/A 08/21/2012   Procedure: Excision of exostosis C2-3,C3-4, C4-5;  Surgeon: Jessy Oto, MD;   Location: [facility];  Service: Orthopedics;  Laterality: N/A;  . ANTERIOR CERVICAL DECOMP/DISCECTOMY FUSION N/A 03/03/2015   Procedure: ANTERIOR CERVICAL DISCECTOMY AND FUSION WITH PARTIAL VERTEBRECTOMY C3 AND C4, CERVICAL PLATE AND SCREWS, ALLOGRAFT, LOCAL BONE GRAFT, VIVIGEN;  Surgeon: Jessy Oto, MD;  Location: [facility];  Service: Orthopedics;  Laterality: N/A;  . COLONOSCOPY  12/2016   1 SSP, rpt 5 yrs - trouble tolerating prep (Nandigam)  . DIRECT LARYNGOSCOPY  03/03/2015   Procedure: DIRECT LARYNGOSCOPY AND PLACEMENT OF FEEDING TUBE;  Surgeon: [name], MD;  Location: [facility];  Service: ENT;;  . ESOPHAGOGASTRODUODENOSCOPY  12/2013   WNL (Outlaw)  . REPAIR OF ESOPHAGUS  03/03/2015   Procedure: REPAIR OF ESOPHAGUS;  Surgeon: [name], MD;  Location: [facility];  Service: ENT;;    There were no vitals filed for this visit.  Subjective Assessment - 01/31/18 1042    Subjective  Pt stated she has pain all over, pain scale 3/10, feels she sat too long yesterday.  Most difficulty picking up items from the floor.    Pertinent History  DISH (diffuse idiopathic skeletal hyperostosis), 2014 anterior cervical exostoses removal C2-5, 2016 ACDF with partial vertebrectomy C3 and C4, esophageal repair    Allergies,    Arthritis,  Back pain,    Gastrointestinal Disease,    Headaches,    High Blood Pressure,    Incontinence,    Kidney, Bladder, Prostate or Urination Problems,    Prior Surgery,    Prosthesis / Implants ,     Patient Stated Goals  Largest goal is to reduce LBP and to focus on that, not all the neck related issues with strength, function and pain, coordination.    Currently in Pain?  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                       [facility]  Adult PT Treatment/Exercise - 01/31/18 0001      Posture/Postural Control   Posture/Postural Control  Postural limitations    Postural Limitations  Rounded Shoulders;Forward head;Decreased lumbar lordosis;Flexed trunk      Exercises   Exercises  Lumbar      Lumbar Exercises: Stretches   Standing Extension  10 reps      Lumbar Exercises: Standing   Heel Raises  10 reps;2 seconds    Heel Raises Limitations  1 set both    Functional Squats  10 reps;Limitations    Functional Squats Limitations  3D hip excursion (weight shifting, rotation, squat front of chair, standing extension    Forward Lunge  10 reps    Forward Lunge Limitations  On 4-inch step    Other Standing Lumbar Exercises  standing infront of wall wiht cervical retraction (unable), UE flexion to 90 degrees)      Lumbar Exercises: Seated   Other Seated Lumbar Exercises  scapular retraction          Balance Exercises - 01/31/18 1105      Balance Exercises: Standing   Tandem Stance  Eyes open;Foam/compliant surface;Intermittent upper extremity support;2 reps;30 secs   palvo with RTB solid surface   SLS  Eyes open;Solid surface;Intermittent upper extremity support;2 reps   Rt 33", Lt 33"   Sidestepping  2 reps;Theraband   RTB       PT Education - 01/31/18 1117    Education Details  Educated importance of proper posture     Person(s) Educated  Patient    Methods  Explanation;Demonstration;Tactile cues;Verbal cues    Comprehension  Verbalized understanding       PT Short Term Goals - 01/26/18 1234      PT SHORT TERM GOAL #1   Title  Patient will be independent in performance of an initial HEP on a regular basis for symptom control.     Time  2    Period  Weeks    Status  Partially Met      PT SHORT TERM GOAL #2   Title  Patient will report a 25% reduction in low back and left leg symptoms with activities requiring forward bending, such as picking an object up off the floor.    Time  2    Period  Weeks     Status  On-going        PT Long Term Goals - 01/26/18 1234      PT LONG TERM GOAL #1  Title  Patient will be independent in performance of an advanced HEP on a regular basis for symptom control.     Time  5   due to out of country 10/12-20/19   Period  Weeks    Status  On-going      PT LONG TERM GOAL #2   Title  Patient will exhibit a 10% or > improvement in FOTO score indicating an improvement in percieved function abilities.     Baseline  initial - 56% limited    Time  5    Period  Weeks    Status  On-going      PT LONG TERM GOAL #3   Title  Patient will be able to control LBP and Lt LE symptoms such that she is able to perform a 10 minute walking program 3x/wk.     Time  5    Period  Weeks    Status  On-going            Plan - 01/31/18 1118    Clinical Impression Statement  Continued session focus with established POC for gluteal and core strengthening.  Pt presents with forward head and rounded shoulders and reports of radicular symptoms down Rt LE.  Pt explained importance of posture to address pain and radicular symptoms, unable to complete cervical retraction and multimodal cueing for scapular retraction.  Progressed gluteal strengthening with additional hip hikes, sidestep with RTB and squats wiht cueing for mechanics.  No reports of increased pain, was limited by fatigue.      Rehab Potential  Fair    Clinical Impairments Affecting Rehab Potential  positive - activity level at home and work; negative - complicated cervical spine history with spinal cord compression symptoms    PT Frequency  3x / week    PT Duration  4 weeks    PT Treatment/Interventions  ADLs/Self Care Home Management;Aquatic Therapy;Gait training;Stair training;Therapeutic activities;Therapeutic exercise;Balance training;Neuromuscular re-education;Patient/family education;Manual techniques;Passive range of motion;Dry needling;Spinal Manipulations;Energy conservation;Joint Manipulations    PT Next  Visit Plan  Minireassess next session.  core/LE strengthening, assess body mechanics for transfers and lifting. proximal hip strengthening; hip hike, glut/piriformis/ITB stretch; minimize exercises lying down due to neck radicular symptoms.    PT Home Exercise Plan  initial - isometric abdominal, supine bridge; 01-23-2018 - stand hip ext/abd, knee flex, heel raise, stand hamstring stretch; 01/26/2018 - seated glut/piriformis stretches       Patient will benefit from skilled therapeutic intervention in order to improve the following deficits and impairments:  Pain, Postural dysfunction, Decreased activity tolerance, Decreased range of motion, Impaired flexibility  Visit Diagnosis: Chronic bilateral low back pain with left-sided sciatica  Other symptoms and signs involving the musculoskeletal system  Muscle weakness (generalized)  Spinal stenosis of cervical region  Abnormal posture     Problem List Patient Active Problem List   Diagnosis Date Noted  . Chronic low back pain without sciatica 12/23/2017  . Acute low back pain 12/23/2017  . Posterior left knee pain 12/23/2017  . Health maintenance examination 11/22/2016  . Obesity, Class I, BMI 30-34.9 11/22/2016  . GERD (gastroesophageal reflux disease) 09/08/2016  . Chronic fatigue 09/08/2016  . Urge incontinence   . Family history of DVT 03/05/2015  . Chronic venous insufficiency 03/05/2015  . Esophageal tear 03/04/2015    Class: Acute  . HNP (herniated nucleus pulposus) with myelopathy, cervical 03/03/2015    Class: Chronic  . Spinal stenosis in cervical region 03/03/2015    Class: Chronic  .  S/P cervical spinal fusion 03/03/2015  . Chest pain 12/18/2012  . Hypertension   . Heart murmur   . Dry eyes   . DISH (diffuse idiopathic skeletal hyperostosis) 08/31/2012    Class: Diagnosis of  . Other dysphagia 08/31/2012    Class: Diagnosis of   Ihor Austin, LPTA; [place] [facility]  Aldona Lento 01/31/2018,  11:23 AM  [facility] [address] [place], [place], [address] Phone: [phone]   Fax:  [phone]  Name: Carolyn [name] MRN: [id number] Date of Birth: August 16, 1965

## 2018-01-31 NOTE — Telephone Encounter (Signed)
01/31/18  pt called and asked that we cancel this appt - no reason was given

## 2018-02-01 ENCOUNTER — Ambulatory Visit (HOSPITAL_COMMUNITY): Payer: BC Managed Care – PPO | Admitting: Physical Therapy

## 2018-02-13 ENCOUNTER — Other Ambulatory Visit: Payer: Self-pay

## 2018-02-13 ENCOUNTER — Encounter (HOSPITAL_COMMUNITY): Payer: Self-pay

## 2018-02-13 ENCOUNTER — Ambulatory Visit (HOSPITAL_COMMUNITY): Payer: BC Managed Care – PPO

## 2018-02-13 DIAGNOSIS — M4802 Spinal stenosis, cervical region: Secondary | ICD-10-CM

## 2018-02-13 DIAGNOSIS — M5442 Lumbago with sciatica, left side: Secondary | ICD-10-CM | POA: Diagnosis not present

## 2018-02-13 DIAGNOSIS — R29898 Other symptoms and signs involving the musculoskeletal system: Secondary | ICD-10-CM

## 2018-02-13 DIAGNOSIS — G8929 Other chronic pain: Secondary | ICD-10-CM

## 2018-02-13 DIAGNOSIS — M6281 Muscle weakness (generalized): Secondary | ICD-10-CM

## 2018-02-13 DIAGNOSIS — R293 Abnormal posture: Secondary | ICD-10-CM

## 2018-02-13 NOTE — Therapy (Signed)
[place], [place], [address] Phone: [phone]   Fax:  [phone]  Physical Therapy Treatment  Patient Details  Name: Carolyn [name] MRN: [id number] Date of Birth: 1965-10-06 Referring Provider (PT): Ria Bush, MD   Encounter Date: 02/13/2018  PT End of Session - 02/13/18 1218    Visit Number  7    Number of Visits  12    Date for PT Re-Evaluation  02/15/18   Minireassess 02/01/18   Authorization Type  [place] - 80/20; visits based on medical necessity; deductible met; no auth required    Authorization Time Period  01/18/2018 - 02/15/2018    Authorization - Visit Number  7    Authorization - Number of Visits  10    PT Start Time  1036    PT Stop Time  1115    PT Time Calculation (min)  39 min    Activity Tolerance  Patient tolerated treatment well;No increased pain    Behavior During Therapy  WFL for tasks assessed/performed       Past Medical History:  Diagnosis Date  . Anxiety   . Arthritis    patient unsure  . Depression    but doesn't require any meds  . Diffuse idiopathic skeletal hyperostosis   . Dry eyes    uses Restasis bid  . Esophageal tear 03/04/2015   Left pyriformis sinus.   [name] GERD (gastroesophageal reflux disease)   . HCAP (healthcare-associated pneumonia) 03/05/2015  . Headache(784.0)    MRI in 2007 bc of headaches  . Heart murmur    New diagnosis  . History of chicken pox   . Hypertension    takes Hyzaar daily  . Neck pain    bone spur on esophagus  . PONV (postoperative nausea and vomiting) 2014   nausea and vomiting   . Spinal stenosis in cervical region 03/03/2015  . Urge incontinence     Past Surgical History:  Procedure Laterality Date  . ABDOMINAL HYSTERECTOMY  2011   bleeding - ovaries remain  . ANTERIOR CERVICAL DECOMP/DISCECTOMY FUSION N/A 08/21/2012   Procedure: Excision of exostosis C2-3,C3-4, C4-5;  Surgeon: Jessy Oto, MD;   Location: [facility];  Service: Orthopedics;  Laterality: N/A;  . ANTERIOR CERVICAL DECOMP/DISCECTOMY FUSION N/A 03/03/2015   Procedure: ANTERIOR CERVICAL DISCECTOMY AND FUSION WITH PARTIAL VERTEBRECTOMY C3 AND C4, CERVICAL PLATE AND SCREWS, ALLOGRAFT, LOCAL BONE GRAFT, VIVIGEN;  Surgeon: Jessy Oto, MD;  Location: [facility];  Service: Orthopedics;  Laterality: N/A;  . COLONOSCOPY  12/2016   1 SSP, rpt 5 yrs - trouble tolerating prep (Nandigam)  . DIRECT LARYNGOSCOPY  03/03/2015   Procedure: DIRECT LARYNGOSCOPY AND PLACEMENT OF FEEDING TUBE;  Surgeon: [name], MD;  Location: [facility];  Service: ENT;;  . ESOPHAGOGASTRODUODENOSCOPY  12/2013   WNL (Outlaw)  . REPAIR OF ESOPHAGUS  03/03/2015   Procedure: REPAIR OF ESOPHAGUS;  Surgeon: [name], MD;  Location: [facility];  Service: ENT;;    There were no vitals filed for this visit.  Subjective Assessment - 02/13/18 1045    Subjective  Patient denies pain this date and states she had a great trip to [place] last week. She states she would like to see Raford Pitcher her evaluating therapist again if able.    Pertinent History  DISH (diffuse idiopathic skeletal hyperostosis), 2014 anterior cervical exostoses removal C2-5, 2016 ACDF with partial vertebrectomy C3 and C4, esophageal repair    Allergies,  Arthritis,    Back pain,    Gastrointestinal Disease,    Headaches,    High Blood Pressure,    Incontinence,    Kidney, Bladder, Prostate or Urination Problems,    Prior Surgery,    Prosthesis / Implants ,     Patient Stated Goals  Largest goal is to reduce LBP and to focus on that, not all the neck related issues with strength, function and pain, coordination.    Currently in Pain?  No/denies         [facility] Adult PT Treatment/Exercise - 02/13/18 0001      Exercises   Exercises  Lumbar      Lumbar Exercises: Stretches   Standing Extension  10 reps;10 seconds    Prone on Elbows Stretch  10 seconds;Limitations    Prone on Elbows Stretch Limitations  10 reps, on  gatched table for comfort, pt starting in flexed posture      Lumbar Exercises: Standing   Heel Raises  20 reps;3 seconds    Functional Squats  10 reps;Limitations    Functional Squats Limitations  2 sets, 2nd set with Red TB around knees; chair taps    Forward Lunge  10 reps    Forward Lunge Limitations  On 4-inch step    Other Standing Lumbar Exercises  wall arch with cervical retraction, 15 reps    Other Standing Lumbar Exercises  knee flex 1x 10, bil LE, red theraband      Lumbar Exercises: Supine   Other Supine Lumbar Exercises  cervical retraction, 1x 15 reps, 3 seconds holds   initially with tactile cue along post spine     Knee/Hip Exercises: Standing   Hip Abduction  Stengthening;Both;1 set;10 reps;Knee straight    Abduction Limitations  red theraband    Hip Extension  Stengthening;Both;1 set;10 reps;Knee straight    Extension Limitations  red theraband    Other Standing Knee Exercises  side stepping with red TB around ankles, 2x RT 15'      Manual Therapy   Manual Therapy  Joint mobilization    Manual therapy comments  completed separate from all other skilled interventions    Joint Mobilization  3x 30-45 sec, grade III oscillations, L2-5   table gatched in flexion posutre for comfort       PT Education - 02/13/18 1217    Education Details  Educated on exercise throughout session and on gluteus medius muscle fibers as she was curious about the change in where she felt her hip working during her clamshell exercise.     Person(s) Educated  Patient    Methods  Explanation    Comprehension  Verbalized understanding;Other (comment)       PT Short Term Goals - 01/26/18 1234      PT SHORT TERM GOAL #1   Title  Patient will be independent in performance of an initial HEP on a regular basis for symptom control.     Time  2    Period  Weeks    Status  Partially Met      PT SHORT TERM GOAL #2   Title  Patient will report a 25% reduction in low back and left leg symptoms  with activities requiring forward bending, such as picking an object up off the floor.    Time  2    Period  Weeks    Status  On-going        PT Long Term Goals - 01/26/18 1234  PT LONG TERM GOAL #1   Title  Patient will be independent in performance of an advanced HEP on a regular basis for symptom control.     Time  5   due to [place] country [place]12[place]20[place]19   Period  Weeks    Status  On-going      PT LONG TERM GOAL #2   Title  Patient will exhibit a 10% or > improvement in FOTO score indicating an improvement in percieved function abilities.     Baseline  initial - 56% limited    Time  5    Period  Weeks    Status  On-going      PT LONG TERM GOAL #3   Title  Patient will be able to control LBP and Lt LE symptoms such that she is able to perform a 10 minute walking program 3x/wk.     Time  5    Period  Weeks    Status  On-going        Plan - 02/13/18 1218    Clinical Impression Statement  Continued with established POC focusing on hip strengthening and postural exercises. Patient was able to perform exercise with focus on hip extension and abduction to target gluteal muscles, and denied pain. She performed side stepping this session with theraband around her ankles. Cervical retraction was performed in supine and patient was able to obtain proper form/muscle activation in gravity eliminated position. Manual interventions were also continued to improve lumbar extension as patient is apprehensive to this posture. She denied pain with PA's to lumbar supine and was able to perform prone on elbows without pain following mobilization. She will continue to benefit from skilled PT interventions to address impairments and progress mobility.    Rehab Potential  Fair    Clinical Impairments Affecting Rehab Potential  positive - activity level at home and work; negative - complicated cervical spine history with spinal cord compression symptoms    PT Frequency  3x / week    PT Duration  4  weeks    PT Treatment/Interventions  ADLs/Self Care Home Management;Aquatic Therapy;Gait training;Stair training;Therapeutic activities;Therapeutic exercise;Balance training;Neuromuscular re-education;Patient/family education;Manual techniques;Passive range of motion;Dry needling;Spinal Manipulations;Energy conservation;Joint Manipulations    PT Next Visit Plan  Mini-reassess next session.  Progress core/LE strengthening as able. Continue with postural strengthening and progressing into extension posture as able.    PT Home Exercise Plan  initial - isometric abdominal, supine bridge; 01-23-2018 - stand hip ext/abd, knee flex, heel raise, stand hamstring stretch; 01/26/2018 - seated glut/piriformis stretches    Consulted and Agree with Plan of Care  Patient       Patient will benefit from skilled therapeutic intervention in order to improve the following deficits and impairments:  Pain, Postural dysfunction, Decreased activity tolerance, Decreased range of motion, Impaired flexibility  Visit Diagnosis: Chronic bilateral low back pain with left-sided sciatica  Other symptoms and signs involving the musculoskeletal system  Muscle weakness (generalized)  Spinal stenosis of cervical region  Abnormal posture     Problem List Patient Active Problem List   Diagnosis Date Noted  . Chronic low back pain without sciatica 12/23/2017  . Acute low back pain 12/23/2017  . Posterior left knee pain 12/23/2017  . Health maintenance examination 11/22/2016  . Obesity, Class I, BMI 30-34.9 11/22/2016  . GERD (gastroesophageal reflux disease) 09/08/2016  . Chronic fatigue 09/08/2016  . Urge incontinence   . Family history of DVT 03/05/2015  . Chronic venous insufficiency 03/05/2015  .  Esophageal tear 03/04/2015    Class: Acute  . HNP (herniated nucleus pulposus) with myelopathy, cervical 03/03/2015    Class: Chronic  . Spinal stenosis in cervical region 03/03/2015    Class: Chronic  . S/P  cervical spinal fusion 03/03/2015  . Chest pain 12/18/2012  . Hypertension   . Heart murmur   . Dry eyes   . DISH (diffuse idiopathic skeletal hyperostosis) 08/31/2012    Class: Diagnosis of  . Other dysphagia 08/31/2012    Class: Diagnosis of    Kipp Brood, PT, DPT Physical Therapist with [facility]  02/13/2018 1:10 PM    [facility] [address] [place], [place], [address] Phone: [phone]   Fax:  [phone]  Name: TEWANA BOHLEN MRN: [id number] Date of Birth: [birth year]

## 2018-02-14 ENCOUNTER — Ambulatory Visit (HOSPITAL_COMMUNITY): Payer: BC Managed Care – PPO

## 2018-02-14 DIAGNOSIS — M4802 Spinal stenosis, cervical region: Secondary | ICD-10-CM

## 2018-02-14 DIAGNOSIS — M6281 Muscle weakness (generalized): Secondary | ICD-10-CM

## 2018-02-14 DIAGNOSIS — G8929 Other chronic pain: Secondary | ICD-10-CM

## 2018-02-14 DIAGNOSIS — M5442 Lumbago with sciatica, left side: Principal | ICD-10-CM

## 2018-02-14 DIAGNOSIS — R29898 Other symptoms and signs involving the musculoskeletal system: Secondary | ICD-10-CM

## 2018-02-14 NOTE — Therapy (Signed)
[place], [place], [address] Phone: [phone]   Fax:  [phone]  Physical Therapy Treatment  Patient Details  Name: Carolyn [name] MRN: [id number] Date of Birth: 04/04/1966 Referring Provider (PT): Ria Bush, MD   Encounter Date: 02/14/2018  PT End of Session - 02/14/18 1033    Visit Number  8    Number of Visits  12    Date for PT Re-Evaluation  02/15/18   Minireassess 02/01/18   Authorization Type  [place] - 80/20; visits based on medical necessity; deductible met; no auth required    Authorization Time Period  01/18/2018 - 02/15/2018    Authorization - Visit Number  8    Authorization - Number of Visits  10    PT Start Time  1219    PT Stop Time  1115    PT Time Calculation (min)  40 min    Activity Tolerance  Patient tolerated treatment well;No increased pain    Behavior During Therapy  WFL for tasks assessed/performed       Past Medical History:  Diagnosis Date  . Anxiety   . Arthritis    patient unsure  . Depression    but doesn't require any meds  . Diffuse idiopathic skeletal hyperostosis   . Dry eyes    uses Restasis bid  . Esophageal tear 03/04/2015   Left pyriformis sinus.   [name] GERD (gastroesophageal reflux disease)   . HCAP (healthcare-associated pneumonia) 03/05/2015  . Headache(784.0)    MRI in 2007 bc of headaches  . Heart murmur    New diagnosis  . History of chicken pox   . Hypertension    takes Hyzaar daily  . Neck pain    bone spur on esophagus  . PONV (postoperative nausea and vomiting) 2014   nausea and vomiting   . Spinal stenosis in cervical region 03/03/2015  . Urge incontinence     Past Surgical History:  Procedure Laterality Date  . ABDOMINAL HYSTERECTOMY  2011   bleeding - ovaries remain  . ANTERIOR CERVICAL DECOMP/DISCECTOMY FUSION N/A 08/21/2012   Procedure: Excision of exostosis C2-3,C3-4, C4-5;  Surgeon: Jessy Oto, MD;   Location: [facility];  Service: Orthopedics;  Laterality: N/A;  . ANTERIOR CERVICAL DECOMP/DISCECTOMY FUSION N/A 03/03/2015   Procedure: ANTERIOR CERVICAL DISCECTOMY AND FUSION WITH PARTIAL VERTEBRECTOMY C3 AND C4, CERVICAL PLATE AND SCREWS, ALLOGRAFT, LOCAL BONE GRAFT, VIVIGEN;  Surgeon: Jessy Oto, MD;  Location: [facility];  Service: Orthopedics;  Laterality: N/A;  . COLONOSCOPY  12/2016   1 SSP, rpt 5 yrs - trouble tolerating prep (Nandigam)  . DIRECT LARYNGOSCOPY  03/03/2015   Procedure: DIRECT LARYNGOSCOPY AND PLACEMENT OF FEEDING TUBE;  Surgeon: [name], MD;  Location: [facility];  Service: ENT;;  . ESOPHAGOGASTRODUODENOSCOPY  12/2013   WNL (Outlaw)  . REPAIR OF ESOPHAGUS  03/03/2015   Procedure: REPAIR OF ESOPHAGUS;  Surgeon: [name], MD;  Location: [facility];  Service: ENT;;    There were no vitals filed for this visit.  Subjective Assessment - 02/14/18 1033    Subjective  Increases in pain with lifting suitcases for her trip but feels she did overall pretty well considering. Sometimes needed to take a rest break before continuing.     Pertinent History  DISH (diffuse idiopathic skeletal hyperostosis), 2014 anterior cervical exostoses removal C2-5, 2016 ACDF with partial vertebrectomy C3 and C4, esophageal repair    Allergies,  Arthritis,    Back pain,    Gastrointestinal Disease,    Headaches,    High Blood Pressure,    Incontinence,    Kidney, Bladder, Prostate or Urination Problems,    Prior Surgery,    Prosthesis / Implants ,     Patient Stated Goals  Largest goal is to reduce LBP and to focus on that, not all the neck related issues with strength, function and pain, coordination.                       [facility] Adult PT Treatment/Exercise - 02/14/18 0001      Lumbar Exercises: Stretches   Standing Extension  10 reps      Lumbar Exercises: Standing   Heel Raises  20 reps;3 seconds    Functional Squats  10 reps;Limitations    Functional Squats Limitations  2 sets, with  Red TB around knees; chair taps    Forward Lunge  10 reps    Forward Lunge Limitations  On 4-inch step    Other Standing Lumbar Exercises  knee flex 1x 10, bil LE, red theraband      Knee/Hip Exercises: Standing   Hip Abduction  Stengthening;Both;1 set;10 reps;Knee straight    Abduction Limitations  red theraband    Hip Extension  Stengthening;Both;1 set;10 reps;Knee straight    Extension Limitations  red theraband    Other Standing Knee Exercises  side stepping with red TB around ankles, 2x RT 15'      Manual Therapy   Manual Therapy  Joint mobilization;Soft tissue mobilization    Manual therapy comments  completed separate from all other skilled interventions    Joint Mobilization  3x 30-45 sec, grade III oscillations, L2-5   table gatched in flexion posutre for comfort   Soft tissue mobilization  bilat gluteals               PT Short Term Goals - 01/26/18 1234      PT SHORT TERM GOAL #1   Title  Patient will be independent in performance of an initial HEP on a regular basis for symptom control.     Time  2    Period  Weeks    Status  Partially Met      PT SHORT TERM GOAL #2   Title  Patient will report a 25% reduction in low back and left leg symptoms with activities requiring forward bending, such as picking an object up off the floor.    Time  2    Period  Weeks    Status  On-going        PT Long Term Goals - 01/26/18 1234      PT LONG TERM GOAL #1   Title  Patient will be independent in performance of an advanced HEP on a regular basis for symptom control.     Time  5   due to out of country 10/12-20/19   Period  Weeks    Status  On-going      PT LONG TERM GOAL #2   Title  Patient will exhibit a 10% or > improvement in FOTO score indicating an improvement in percieved function abilities.     Baseline  initial - 56% limited    Time  5    Period  Weeks    Status  On-going      PT LONG TERM GOAL #3   Title  Patient will be able to control LBP  and Lt  LE symptoms such that she is able to perform a 10 minute walking program 3x/wk.     Time  5    Period  Weeks    Status  On-going            Plan - 02/14/18 1034    Clinical Impression Statement  Continued with established POC focusing on hip strengthening and postural exercises. Patient was able to perform exercise with focus on hip extension and abduction to target gluteal muscles, and denied pain. Required verbal and tactile cuing to centralizing hip strengthening exercises and standing extension for correct performance mechanically and decrease compensations. She performed side stepping this session with theraband around her ankles. Manual interventions were also continued to improve lumbar extension as patient is apprehensive to this posture and address soft tissue adhesion in bilateral gluteals; minimal adhesions noted this session. She denied pain with central and unilateral PA's to lumbar. She will continue to benefit from skilled PT interventions to address impairments increase strength and progress mobility.    Rehab Potential  Fair    Clinical Impairments Affecting Rehab Potential  positive - activity level at home and work; negative - complicated cervical spine history with spinal cord compression symptoms    PT Frequency  3x / week    PT Duration  4 weeks    PT Treatment/Interventions  ADLs/Self Care Home Management;Aquatic Therapy;Gait training;Stair training;Therapeutic activities;Therapeutic exercise;Balance training;Neuromuscular re-education;Patient/family education;Manual techniques;Passive range of motion;Dry needling;Spinal Manipulations;Energy conservation;Joint Manipulations    PT Next Visit Plan  Mini-reassess next session.  Progress core/LE strengthening as able. Continue with postural strengthening and progressing into extension posture as able.    PT Home Exercise Plan  initial - isometric abdominal, supine bridge; 01-23-2018 - stand hip ext/abd, knee flex, heel raise,  stand hamstring stretch; 01/26/2018 - seated glut/piriformis stretches    Consulted and Agree with Plan of Care  Patient       Patient will benefit from skilled therapeutic intervention in order to improve the following deficits and impairments:  Pain, Postural dysfunction, Decreased activity tolerance, Decreased range of motion, Impaired flexibility  Visit Diagnosis: Chronic bilateral low back pain with left-sided sciatica  Other symptoms and signs involving the musculoskeletal system  Muscle weakness (generalized)  Spinal stenosis of cervical region     Problem List Patient Active Problem List   Diagnosis Date Noted  . Chronic low back pain without sciatica 12/23/2017  . Acute low back pain 12/23/2017  . Posterior left knee pain 12/23/2017  . Health maintenance examination 11/22/2016  . Obesity, Class I, BMI 30-34.9 11/22/2016  . GERD (gastroesophageal reflux disease) 09/08/2016  . Chronic fatigue 09/08/2016  . Urge incontinence   . Family history of DVT 03/05/2015  . Chronic venous insufficiency 03/05/2015  . Esophageal tear 03/04/2015    Class: Acute  . HNP (herniated nucleus pulposus) with myelopathy, cervical 03/03/2015    Class: Chronic  . Spinal stenosis in cervical region 03/03/2015    Class: Chronic  . S/P cervical spinal fusion 03/03/2015  . Chest pain 12/18/2012  . Hypertension   . Heart murmur   . Dry eyes   . DISH (diffuse idiopathic skeletal hyperostosis) 08/31/2012    Class: Diagnosis of  . Other dysphagia 08/31/2012    Class: Diagnosis of    Carolyn Raveling. Hartnett-Rands, MS, PT Per [facility] #[id number] 02/14/2018, 12:26 PM  [facility] [address] [place], [place], [address] Phone: [phone]  Fax:  [phone]  Name: Carolyn [name] MRN: [id number] Date of Birth: [birth year]

## 2018-02-15 ENCOUNTER — Ambulatory Visit (HOSPITAL_COMMUNITY): Payer: BC Managed Care – PPO

## 2018-02-15 DIAGNOSIS — M5442 Lumbago with sciatica, left side: Secondary | ICD-10-CM | POA: Diagnosis not present

## 2018-02-15 DIAGNOSIS — M6281 Muscle weakness (generalized): Secondary | ICD-10-CM

## 2018-02-15 DIAGNOSIS — R29898 Other symptoms and signs involving the musculoskeletal system: Secondary | ICD-10-CM

## 2018-02-15 DIAGNOSIS — R293 Abnormal posture: Secondary | ICD-10-CM

## 2018-02-15 DIAGNOSIS — G8929 Other chronic pain: Secondary | ICD-10-CM

## 2018-02-15 DIAGNOSIS — M4802 Spinal stenosis, cervical region: Secondary | ICD-10-CM

## 2018-02-15 NOTE — Therapy (Signed)
[facility], [place], [address] Phone: [phone]   Fax:  [phone]  Physical Therapy Treatment/Progress Note  Patient Details  Name: Carolyn [name] MRN: [id number] Date of Birth: [birth year] Referring Provider (PT): Ria Bush, MD  Progress Note for dates 01/18/2018 - 02/15/2018  Encounter Date: 02/15/2018  PT End of Session - 02/15/18 1030    Visit Number  9    Number of Visits  12    Date for PT Re-Evaluation  02/26/18   Minireassess 02/01/18   Authorization Type  [place] - 80/20; visits based on medical necessity; deductible met; no auth required    Authorization Time Period  01/18/2018 - 02/26/2018    Authorization - Visit Number  0    Authorization - Number of Visits  10    PT Start Time  1031    PT Stop Time  1115    PT Time Calculation (min)  44 min    Activity Tolerance  Patient tolerated treatment well;No increased pain    Behavior During Therapy  WFL for tasks assessed/performed       Past Medical History:  Diagnosis Date  . Anxiety   . Arthritis    patient unsure  . Depression    but doesn't require any meds  . Diffuse idiopathic skeletal hyperostosis   . Dry eyes    uses Restasis bid  . Esophageal tear 03/04/2015   Left pyriformis sinus.   [name] GERD (gastroesophageal reflux disease)   . HCAP (healthcare-associated pneumonia) 03/05/2015  . Headache(784.0)    MRI in 2007 bc of headaches  . Heart murmur    New diagnosis  . History of chicken pox   . Hypertension    takes Hyzaar daily  . Neck pain    bone spur on esophagus  . PONV (postoperative nausea and vomiting) 2014   nausea and vomiting   . Spinal stenosis in cervical region 03/03/2015  . Urge incontinence     Past Surgical History:  Procedure Laterality Date  . ABDOMINAL HYSTERECTOMY  2011   bleeding - ovaries remain  . ANTERIOR CERVICAL DECOMP/DISCECTOMY FUSION N/A 08/21/2012   Procedure: Excision of  exostosis C2-3,C3-4, C4-5;  Surgeon: Jessy Oto, MD;  Location: [facility];  Service: Orthopedics;  Laterality: N/A;  . ANTERIOR CERVICAL DECOMP/DISCECTOMY FUSION N/A 03/03/2015   Procedure: ANTERIOR CERVICAL DISCECTOMY AND FUSION WITH PARTIAL VERTEBRECTOMY C3 AND C4, CERVICAL PLATE AND SCREWS, ALLOGRAFT, LOCAL BONE GRAFT, VIVIGEN;  Surgeon: Jessy Oto, MD;  Location: [facility];  Service: Orthopedics;  Laterality: N/A;  . COLONOSCOPY  12/2016   1 SSP, rpt 5 yrs - trouble tolerating prep (Nandigam)  . DIRECT LARYNGOSCOPY  03/03/2015   Procedure: DIRECT LARYNGOSCOPY AND PLACEMENT OF FEEDING TUBE;  Surgeon: [name], MD;  Location: [facility];  Service: ENT;;  . ESOPHAGOGASTRODUODENOSCOPY  12/2013   WNL (Outlaw)  . REPAIR OF ESOPHAGUS  03/03/2015   Procedure: REPAIR OF ESOPHAGUS;  Surgeon: [name], MD;  Location: [facility];  Service: ENT;;    There were no vitals filed for this visit.  Subjective Assessment - 02/15/18 1029    Subjective  No pain complaints today; was able to catch up on sleep last night from her trip to [place].     Pertinent History  DISH (diffuse idiopathic skeletal hyperostosis), 2014 anterior cervical exostoses removal C2-5, 2016 ACDF with partial vertebrectomy C3 and C4, esophageal repair    Allergies,  Arthritis,    Back pain,    Gastrointestinal Disease,    Headaches,    High Blood Pressure,    Incontinence,    Kidney, Bladder, Prostate or Urination Problems,    Prior Surgery,    Prosthesis / Implants ,     Patient Stated Goals  Largest goal is to reduce LBP and to focus on that, not all the neck related issues with strength, function and pain, coordination.                       [facility] Adult PT Treatment/Exercise - 02/15/18 0001      Lumbar Exercises: Standing   Heel Raises  20 reps;3 seconds    Other Standing Lumbar Exercises  knee flex 1x 10, bil LE, red theraband      Knee/Hip Exercises: Standing   Hip Abduction  Stengthening;Both;1 set;10 reps;Knee  straight    Abduction Limitations  red theraband    Hip Extension  Stengthening;Both;1 set;10 reps;Knee straight    Extension Limitations  red theraband    Other Standing Knee Exercises  side stepping with red TB around ankles, 2x RT 15'      Manual Therapy   Manual Therapy  Joint mobilization;Soft tissue mobilization    Manual therapy comments  completed separate from all other skilled interventions    Joint Mobilization  3x 30-45 sec, grade III oscillations, L2-5   table gatched in flexion posutre for comfort   Soft tissue mobilization  Left guteals             PT Education - 02/15/18 1254    Education Details  Discussed purpose and technique of exercises throughout session.     Person(s) Educated  Patient    Methods  Explanation    Comprehension  Verbalized understanding       PT Short Term Goals - 02/15/18 1054      PT SHORT TERM GOAL #1   Title  Patient will be independent in performance of an initial HEP on a regular basis for symptom control.     Time  2    Period  Weeks    Status  Achieved      PT SHORT TERM GOAL #2   Title  Patient will report a 25% reduction in low back and left leg symptoms with activities requiring forward bending, such as picking an object up off the floor.    Baseline  02/15/2018 - improved for picking up object once, but not repetitively    Time  2    Period  Weeks    Status  On-going        PT Long Term Goals - 02/15/18 1055      PT LONG TERM GOAL #1   Title  Patient will be independent in performance of an advanced HEP on a regular basis for symptom control.     Time  5   due to out of country [place]12[place]20/19   Period  Weeks    Status  Partially Met      PT LONG TERM GOAL #2   Title  Patient will exhibit a 10% or > improvement in FOTO score indicating an improvement in percieved function abilities.     Baseline  initial - 56% limited    Time  5    Period  Weeks    Status  On-going      PT LONG TERM GOAL #3   Title  Patient  will be  able to control LBP and Lt LE symptoms such that she is able to perform a 10 minute walking program 3x/wk.     Time  5    Period  Weeks    Status  Achieved            Plan - 02/15/18 1030    Clinical Impression Statement  Continued with established POC focusing on hip strengthening and postural exercises. Patient was able to perform exercise with focus on hip extension and abduction to target gluteal muscles, and denied pain. Continued to require verbal and tactile cuing to centralizing hip strengthening exercises and standing extension for correct performance mechanically and decrease compensations. Manual interventions were also continued to improve lumbar extension as patient is apprehensive to this posture and address soft tissue adhesion in bilateral gluteals; minimal adhesions in left glut med noted this session. She denied pain with central and unilateral PA's to lumbar. Central PA to B02 elicited pain. She will continue to benefit from skilled PT interventions to address impairments increase strength and progress mobility. Patient feels she has a good HEP to complete. Understands she will be re-evaluated on her last visit and at this time feels she would be ready to continue with her HEP independently to reduce pain and increase strength over time.     Rehab Potential  Fair    Clinical Impairments Affecting Rehab Potential  positive - activity level at home and work; negative - complicated cervical spine history with spinal cord compression symptoms    PT Frequency  3x / week    PT Duration  4 weeks    PT Treatment/Interventions  ADLs/Self Care Home Management;Aquatic Therapy;Gait training;Stair training;Therapeutic activities;Therapeutic exercise;Balance training;Neuromuscular re-education;Patient/family education;Manual techniques;Passive range of motion;Dry needling;Spinal Manipulations;Energy conservation;Joint Manipulations    PT Next Visit Plan  Mini-reassess next session.   Progress core/LE strengthening as able. Continue with postural strengthening and progressing into extension posture as able.    PT Home Exercise Plan  initial - isometric abdominal, supine bridge; 01-23-2018 - stand hip ext/abd, knee flex, heel raise, stand hamstring stretch; 01/26/2018 - seated glut/piriformis stretches    Consulted and Agree with Plan of Care  Patient       Patient will benefit from skilled therapeutic intervention in order to improve the following deficits and impairments:  Pain, Postural dysfunction, Decreased activity tolerance, Decreased range of motion, Impaired flexibility  Visit Diagnosis: Chronic bilateral low back pain with left-sided sciatica  Other symptoms and signs involving the musculoskeletal system  Muscle weakness (generalized)  Spinal stenosis of cervical region  Abnormal posture     Problem List Patient Active Problem List   Diagnosis Date Noted  . Chronic low back pain without sciatica 12/23/2017  . Acute low back pain 12/23/2017  . Posterior left knee pain 12/23/2017  . Health maintenance examination 11/22/2016  . Obesity, Class I, BMI 30-34.9 11/22/2016  . GERD (gastroesophageal reflux disease) 09/08/2016  . Chronic fatigue 09/08/2016  . Urge incontinence   . Family history of DVT 03/05/2015  . Chronic venous insufficiency 03/05/2015  . Esophageal tear 03/04/2015    Class: Acute  . HNP (herniated nucleus pulposus) with myelopathy, cervical 03/03/2015    Class: Chronic  . Spinal stenosis in cervical region 03/03/2015    Class: Chronic  . S/P cervical spinal fusion 03/03/2015  . Chest pain 12/18/2012  . Hypertension   . Heart murmur   . Dry eyes   . DISH (diffuse idiopathic skeletal hyperostosis) 08/31/2012    Class: Diagnosis  of  . Other dysphagia 08/31/2012    Class: Diagnosis of    Floria Raveling. Hartnett-Rands, MS, PT Per [place] [phone] 02/15/2018, 1:08 PM  [facility] [address] [place], [place], [address] Phone: [phone]   Fax:  [phone]  Name: JARIKA ROBBEN MRN: [id number] Date of Birth: 1965/08/10

## 2018-02-19 ENCOUNTER — Encounter (HOSPITAL_COMMUNITY): Payer: Self-pay

## 2018-02-19 ENCOUNTER — Ambulatory Visit (HOSPITAL_COMMUNITY): Payer: BC Managed Care – PPO

## 2018-02-19 DIAGNOSIS — R29898 Other symptoms and signs involving the musculoskeletal system: Secondary | ICD-10-CM

## 2018-02-19 DIAGNOSIS — M5442 Lumbago with sciatica, left side: Principal | ICD-10-CM

## 2018-02-19 DIAGNOSIS — G8929 Other chronic pain: Secondary | ICD-10-CM

## 2018-02-19 DIAGNOSIS — M4802 Spinal stenosis, cervical region: Secondary | ICD-10-CM

## 2018-02-19 DIAGNOSIS — M6281 Muscle weakness (generalized): Secondary | ICD-10-CM

## 2018-02-19 DIAGNOSIS — R293 Abnormal posture: Secondary | ICD-10-CM

## 2018-02-19 NOTE — Therapy (Signed)
[place], [place], [address] Phone: [phone]   Fax:  [phone]  Physical Therapy Treatment  Patient Details  Name: Carolyn [name] MRN: [id number] Date of Birth: 07/03/1965 Referring Provider (PT): Ria Bush, MD   Encounter Date: 02/19/2018  PT End of Session - 02/19/18 0820    Visit Number  10    Number of Visits  12    Date for PT Re-Evaluation  02/26/18   Minireassess 02/01/18   Authorization Type  [place] - 80/20; visits based on medical necessity; deductible met; no auth required    Authorization Time Period  01/18/2018 - 02/26/2018    Authorization - Visit Number  1    Authorization - Number of Visits  10    PT Start Time  3710    PT Stop Time  6269    PT Time Calculation (min)  41 min    Activity Tolerance  Patient tolerated treatment well;No increased pain    Behavior During Therapy  WFL for tasks assessed/performed       Past Medical History:  Diagnosis Date  . Anxiety   . Arthritis    patient unsure  . Depression    but doesn't require any meds  . Diffuse idiopathic skeletal hyperostosis   . Dry eyes    uses Restasis bid  . Esophageal tear 03/04/2015   Left pyriformis sinus.   [name] GERD (gastroesophageal reflux disease)   . HCAP (healthcare-associated pneumonia) 03/05/2015  . Headache(784.0)    MRI in 2007 bc of headaches  . Heart murmur    New diagnosis  . History of chicken pox   . Hypertension    takes Hyzaar daily  . Neck pain    bone spur on esophagus  . PONV (postoperative nausea and vomiting) 2014   nausea and vomiting   . Spinal stenosis in cervical region 03/03/2015  . Urge incontinence     Past Surgical History:  Procedure Laterality Date  . ABDOMINAL HYSTERECTOMY  2011   bleeding - ovaries remain  . ANTERIOR CERVICAL DECOMP/DISCECTOMY FUSION N/A 08/21/2012   Procedure: Excision of exostosis C2-3,C3-4, C4-5;  Surgeon: Jessy Oto, MD;   Location: [facility];  Service: Orthopedics;  Laterality: N/A;  . ANTERIOR CERVICAL DECOMP/DISCECTOMY FUSION N/A 03/03/2015   Procedure: ANTERIOR CERVICAL DISCECTOMY AND FUSION WITH PARTIAL VERTEBRECTOMY C3 AND C4, CERVICAL PLATE AND SCREWS, ALLOGRAFT, LOCAL BONE GRAFT, VIVIGEN;  Surgeon: Jessy Oto, MD;  Location: [facility];  Service: Orthopedics;  Laterality: N/A;  . COLONOSCOPY  12/2016   1 SSP, rpt 5 yrs - trouble tolerating prep (Nandigam)  . DIRECT LARYNGOSCOPY  03/03/2015   Procedure: DIRECT LARYNGOSCOPY AND PLACEMENT OF FEEDING TUBE;  Surgeon: [name], MD;  Location: [facility];  Service: ENT;;  . ESOPHAGOGASTRODUODENOSCOPY  12/2013   WNL ([place])  . REPAIR OF ESOPHAGUS  03/03/2015   Procedure: REPAIR OF ESOPHAGUS;  Surgeon: [name], MD;  Location: [facility];  Service: ENT;;    There were no vitals filed for this visit.  Subjective Assessment - 02/19/18 0819    Subjective  Patient denies back pain today but reports her legs are sore. She states she did her HEP this weekend without difficulty.    Pertinent History  DISH (diffuse idiopathic skeletal hyperostosis), 2014 anterior cervical exostoses removal C2-5, 2016 ACDF with partial vertebrectomy C3 and C4, esophageal repair    Allergies,    Arthritis,    Back pain,  Gastrointestinal Disease,    Headaches,    High Blood Pressure,    Incontinence,    Kidney, Bladder, Prostate or Urination Problems,    Prior Surgery,    Prosthesis / Implants ,     Limitations  Sitting;House hold activities    Patient Stated Goals  Largest goal is to reduce LBP and to focus on that, not all the neck related issues with strength, function and pain, coordination.    Currently in Pain?  No/denies        [facility] Adult PT Treatment/Exercise - 02/19/18 0001      Exercises   Exercises  Lumbar      Lumbar Exercises: Stretches   Standing Extension  10 reps;10 seconds    Prone on Elbows Stretch  10 seconds;Limitations    Prone on Elbows Stretch Limitations  10  reps, on gatched table for comfort, pt starting in flexed posture      Lumbar Exercises: Standing   Heel Raises  15 reps;3 seconds   on incline   Heel Raises Limitations  1x 15 reps toe raises, on decline, 3 sec    Functional Squats  15 reps;Limitations    Functional Squats Limitations  chair taps at mat table, 2 sets    Forward Lunge  15 reps    Forward Lunge Limitations  On 4-inch step; bil LE    Other Standing Lumbar Exercises  --      Lumbar Exercises: Seated   Other Seated Lumbar Exercises  W back for scapular retraction, 1x 15 reps, 3 sec holds    Other Seated Lumbar Exercises  Money with red theraband, 1x 15 reps, 3 sec holds      Knee/Hip Exercises: Standing   Hip Abduction  Stengthening;Both;1 set;10 reps;Knee straight    Abduction Limitations  red theraband    Hip Extension  Stengthening;Both;1 set;10 reps;Knee straight    Extension Limitations  red theraband      Manual Therapy   Manual Therapy  Joint mobilization    Manual therapy comments  completed separate from all other skilled interventions    Joint Mobilization  3x 30-45 sec, grade III oscillations, L2-5       Balance Exercises - 02/19/18 0827      Balance Exercises: Standing   Tandem Stance  Eyes open;Foam/compliant surface;Intermittent upper extremity support;4 reps   2x each direction for pallof press, 10 reps, alt foot align   Sidestepping  2 reps;Theraband   2x RT, 15', red theraband at ankles       PT Education - 02/19/18 0820    Education Details  Educated on exercises throughout session and on purpose of interventions.     Person(s) Educated  Patient    Methods  Explanation    Comprehension  Verbalized understanding       PT Short Term Goals - 02/15/18 1054      PT SHORT TERM GOAL #1   Title  Patient will be independent in performance of an initial HEP on a regular basis for symptom control.     Time  2    Period  Weeks    Status  Achieved      PT SHORT TERM GOAL #2   Title  Patient  will report a 25% reduction in low back and left leg symptoms with activities requiring forward bending, such as picking an object up off the floor.    Baseline  02/15/2018 - improved for picking up object once, but not repetitively  Time  2    Period  Weeks    Status  On-going        PT Long Term Goals - 02/15/18 1055      PT LONG TERM GOAL #1   Title  Patient will be independent in performance of an advanced HEP on a regular basis for symptom control.     Time  5   due to out of [place] 10/12-20/19   Period  Weeks    Status  Partially Met      PT LONG TERM GOAL #2   Title  Patient will exhibit a 10% or > improvement in FOTO score indicating an improvement in percieved function abilities.     Baseline  initial - 56% limited    Time  5    Period  Weeks    Status  On-going      PT LONG TERM GOAL #3   Title  Patient will be able to control LBP and Lt LE symptoms such that she is able to perform a 10 minute walking program 3x/wk.     Time  5    Period  Weeks    Status  Achieved        Plan - 02/19/18 5009    Clinical Impression Statement  Continued with POC for hip and core strengthening. Patient continues to require cues to maintain upright posture during exercises. She performed scapular restraction exercises and prone press ups to address postural limitations. Balance challenges were advanced to increase core stability exercises and patient required intermittent UE support with pallof press. She denied pain throughout session and with central PA's to lumbar. She will continue to benefit from skilled PT interventions to address impairments increase strength and progress mobility    Rehab Potential  Fair    Clinical Impairments Affecting Rehab Potential  positive - activity level at home and work; negative - complicated cervical spine history with spinal cord compression symptoms    PT Frequency  3x / week    PT Duration  4 weeks    PT Treatment/Interventions  ADLs/Self Care  Home Management;Aquatic Therapy;Gait training;Stair training;Therapeutic activities;Therapeutic exercise;Balance training;Neuromuscular re-education;Patient/family education;Manual techniques;Passive range of motion;Dry needling;Spinal Manipulations;Energy conservation;Joint Manipulations    PT Next Visit Plan  Continue with hip and core strengthening and progression into extension posture as tolerated.     PT Home Exercise Plan  initial - isometric abdominal, supine bridge; 01-23-2018 - stand hip ext/abd, knee flex, heel raise, stand hamstring stretch; 01/26/2018 - seated glut/piriformis stretches    Consulted and Agree with Plan of Care  Patient       Patient will benefit from skilled therapeutic intervention in order to improve the following deficits and impairments:  Pain, Postural dysfunction, Decreased activity tolerance, Decreased range of motion, Impaired flexibility  Visit Diagnosis: Chronic bilateral low back pain with left-sided sciatica  Other symptoms and signs involving the musculoskeletal system  Muscle weakness (generalized)  Spinal stenosis of cervical region  Abnormal posture     Problem List Patient Active Problem List   Diagnosis Date Noted  . Chronic low back pain without sciatica 12/23/2017  . Acute low back pain 12/23/2017  . Posterior left knee pain 12/23/2017  . Health maintenance examination 11/22/2016  . Obesity, Class I, BMI 30-34.9 11/22/2016  . GERD (gastroesophageal reflux disease) 09/08/2016  . Chronic fatigue 09/08/2016  . Urge incontinence   . Family history of DVT 03/05/2015  . Chronic venous insufficiency 03/05/2015  . Esophageal tear 03/04/2015  Class: Acute  . HNP (herniated nucleus pulposus) with myelopathy, cervical 03/03/2015    Class: Chronic  . Spinal stenosis in cervical region 03/03/2015    Class: Chronic  . S/P cervical spinal fusion 03/03/2015  . Chest pain 12/18/2012  . Hypertension   . Heart murmur   . Dry eyes   . DISH  (diffuse idiopathic skeletal hyperostosis) 08/31/2012    Class: Diagnosis of  . Other dysphagia 08/31/2012    Class: Diagnosis of    Kipp Brood, PT, DPT Physical Therapist with [facility]  02/19/2018 9:05 AM    [facility] [address] [place], [place], [address] Phone: [phone]   Fax:  [phone]  Name: KADASIA KASSING MRN: [id number] Date of Birth: 02-05-1966

## 2018-02-22 ENCOUNTER — Ambulatory Visit (HOSPITAL_COMMUNITY): Payer: BC Managed Care – PPO

## 2018-02-22 ENCOUNTER — Telehealth (HOSPITAL_COMMUNITY): Payer: Self-pay | Admitting: Family Medicine

## 2018-02-22 NOTE — Telephone Encounter (Signed)
02/22/18  pt called to cx both appts said that she wasn't feeling well today

## 2018-02-23 ENCOUNTER — Ambulatory Visit (HOSPITAL_COMMUNITY): Payer: BC Managed Care – PPO

## 2018-02-23 ENCOUNTER — Telehealth (HOSPITAL_COMMUNITY): Payer: Self-pay | Admitting: Family Medicine

## 2018-02-23 NOTE — Telephone Encounter (Signed)
02/23/18  Patient cancelled her last 2 appts this week and I called her to see if she wanted to rescehdule them or be discharged and she said to go ahead and discharge her.

## 2018-03-25 NOTE — Progress Notes (Signed)
BP 120/76 (BP Location: Left Arm, Patient Position: Sitting, Cuff Size: Normal)   Pulse 66   Temp 98.6 F (37 C) (Oral)   Ht 5' 7.25" (1.708 m)   Wt 191 lb 4 oz (86.8 kg)   SpO2 97%   BMI 29.73 kg/m    CC: 3 mo f/u visit Subjective:    Patient ID: [name], female    DOB: [birth year], 52 y.o.   MRN: [id number]  HPI: Carolyn [name] is a 52 y.o. female presenting on 03/26/2018 for Follow-up (Here for 3 mo f/u.)   See prior note for details.  For worsening chronic back pain (known lower with L sciatica, cervical spinal stenosis and DISH), she was referred for physical therapy course without significant benefit. Lower back pain worse than cervical neck.   Modified barium swallow 12/2017 reviewed - within functional limits. Thought potential due to sensory changes after multiple cervical spine surgeries. rec regular solids, thin liquids, whole meds with puree. rec use straw and larger boluses. She does better with larger pills.   Several neck procedures in the past: 2014 anterior cervical exostoses removal C2-5 2016 ACDF with partial vertebrectomy C3 and C4, esophageal repair (2cm tear near pyriform sinus) MBS 02/2015 - mod pharyngeal dysphagia due to edema.   Relevant past medical, surgical, family and social history reviewed and updated as indicated. Interim medical history since our last visit reviewed. Allergies and medications reviewed and updated. Outpatient Medications Prior to Visit  Medication Sig Dispense Refill  . amLODipine (NORVASC) 10 MG tablet Take 1 tablet (10 mg total) by mouth daily. 90 tablet 3  . cyclobenzaprine (FLEXERIL) 10 MG tablet Take 0.5-1 tablets (5-10 mg total) by mouth 2 (two) times daily as needed for muscle spasms. 30 tablet 0  . Ibuprofen-Famotidine 800-26.6 MG TABS Take 1 tablet by mouth 2 (two) times daily after a meal. 180 tablet 3  . losartan-hydrochlorothiazide (HYZAAR) 100-12.5 MG tablet Take 1 tablet by mouth daily. 90 tablet 3  .  Psyllium (METAMUCIL FIBER PO) Take by mouth as needed.    [name] 0.9 %  sodium chloride infusion      No facility-administered medications prior to visit.      Per HPI unless specifically indicated in ROS section below Review of Systems     Objective:    BP 120/76 (BP Location: Left Arm, Patient Position: Sitting, Cuff Size: Normal)   Pulse 66   Temp 98.6 F (37 C) (Oral)   Ht 5' 7.25" (1.708 m)   Wt 191 lb 4 oz (86.8 kg)   SpO2 97%   BMI 29.73 kg/m   Wt Readings from Last 3 Encounters:  03/26/18 191 lb 4 oz (86.8 kg)  12/19/17 197 lb 12 oz (89.7 kg)  06/28/17 210 lb (95.3 kg)    Physical Exam  Constitutional: She appears well-developed and well-nourished. No distress.  Nursing note and vitals reviewed.  Results for orders placed or performed in visit on 12/12/17  Lipid panel  Result Value Ref Range   Cholesterol 115 0 - 200 mg/dL   Triglycerides 68.0 0.0 - 149.0 mg/dL   HDL 39.70 >39.00 mg/dL   VLDL 13.6 0.0 - 40.0 mg/dL   LDL Cholesterol 62 0 - 99 mg/dL   Total CHOL/HDL Ratio 3    NonHDL 75.60   Comprehensive metabolic panel  Result Value Ref Range   Sodium 141 135 - 145 mEq/L   Potassium 3.8 3.5 - 5.1 mEq/L   Chloride 102 96 -  112 mEq/L   CO2 32 19 - 32 mEq/L   Glucose, Bld 112 (H) 70 - 99 mg/dL   BUN 20 6 - 23 mg/dL   Creatinine, Ser 0.87 0.40 - 1.20 mg/dL   Total Bilirubin 0.6 0.2 - 1.2 mg/dL   Alkaline Phosphatase 76 39 - 117 U/L   AST 13 0 - 37 U/L   ALT 15 0 - 35 U/L   Total Protein 7.5 6.0 - 8.3 g/dL   Albumin 4.4 3.5 - 5.2 g/dL   Calcium 10.1 8.4 - 10.5 mg/dL   GFR 72.69 >60.00 mL/min      Assessment & Plan:   Problem List Items Addressed This Visit      Diagnosis of   Other dysphagia - Primary (Chronic)    Reviewed recent MBS as well as recommendations made - larger boluses, larger tablets. Smaller pills lodged in valleculae. Pt was reassured that explanation was found for her symptoms.       DISH (diffuse idiopathic skeletal hyperostosis)       Other   Chronic low back pain without sciatica    PT course ended early due to lack of notable benefit. See below. Suggested ortho f/u if not improved with below.       Relevant Medications   traMADol (ULTRAM) 50 MG tablet   Acute low back pain    No acute severe flares recently. Discussed ongoing NSAID use (ibuprofen/famotidine), and muscle relaxant use. Will trial tramadol for breakthrough pain. 50mg  #20 sent to pharmacy.       Relevant Medications   traMADol (ULTRAM) 50 MG tablet       Meds ordered this encounter  Medications  . traMADol (ULTRAM) 50 MG tablet    Sig: Take 1 tablet (50 mg total) by mouth every 8 (eight) hours as needed.    Dispense:  20 tablet    Refill:  0   No orders of the defined types were placed in this encounter.   Follow up plan: No follow-ups on file.  Ria Bush, MD

## 2018-03-26 ENCOUNTER — Ambulatory Visit (INDEPENDENT_AMBULATORY_CARE_PROVIDER_SITE_OTHER): Payer: BC Managed Care – PPO | Admitting: Family Medicine

## 2018-03-26 ENCOUNTER — Encounter: Payer: Self-pay | Admitting: Family Medicine

## 2018-03-26 VITALS — BP 120/76 | HR 66 | Temp 98.6°F | Ht 67.25 in | Wt 191.2 lb

## 2018-03-26 DIAGNOSIS — G8929 Other chronic pain: Secondary | ICD-10-CM | POA: Diagnosis not present

## 2018-03-26 DIAGNOSIS — M545 Low back pain, unspecified: Secondary | ICD-10-CM

## 2018-03-26 DIAGNOSIS — M4810 Ankylosing hyperostosis [Forestier], site unspecified: Secondary | ICD-10-CM | POA: Diagnosis not present

## 2018-03-26 DIAGNOSIS — R1319 Other dysphagia: Secondary | ICD-10-CM | POA: Diagnosis not present

## 2018-03-26 MED ORDER — TRAMADOL HCL 50 MG PO TABS: 50.0000 mg | ORAL_TABLET | Freq: Three times a day (TID) | ORAL | 0 refills | Status: DC | PRN

## 2018-03-26 NOTE — Assessment & Plan Note (Addendum)
PT course ended early due to lack of notable benefit. See below. Suggested ortho f/u if not improved with below.

## 2018-03-26 NOTE — Patient Instructions (Addendum)
Continue flexeril as needed muscle relaxant at night - consider 1 week nightly to see effect. May try tramadol 50mg  1 tablet as needed for breakthrough back pain.  Touch base with Dr Louanne Skye if ongoing back pain.

## 2018-03-26 NOTE — Assessment & Plan Note (Signed)
Reviewed recent MBS as well as recommendations made - larger boluses, larger tablets. Smaller pills lodged in valleculae. Pt was reassured that explanation was found for her symptoms.

## 2018-03-26 NOTE — Assessment & Plan Note (Signed)
No acute severe flares recently. Discussed ongoing NSAID use (ibuprofen/famotidine), and muscle relaxant use. Will trial tramadol for breakthrough pain. 50mg  #20 sent to pharmacy.

## 2018-04-05 ENCOUNTER — Encounter (HOSPITAL_COMMUNITY): Payer: Self-pay

## 2018-04-05 NOTE — Therapy (Signed)
[place], [place], [address] Phone: [phone]   Fax:  [phone]  Patient Details  Name: Carolyn [name] MRN: [id number] Date of Birth: [birth year] Referring Provider:  No ref. provider found  Encounter Date: 04/05/2018   PHYSICAL THERAPY DISCHARGE SUMMARY  Visits from Start of Care: 10  Current functional level related to goals / functional outcomes: Patient participated in 10 visits of therapy and her last session was on 02/19/18. She was progressing well and had achieved or partially met over half of her goals. She cancelled her last 2 appointments and asked that she be discharged rather than reschedule. She will be discharged from this episode of therapy per request.    Remaining deficits: See last treatment notes for details.   PT Short Term Goals - 02/15/18 1054            PT SHORT TERM GOAL #1   Title  Patient will be independent in performance of an initial HEP on a regular basis for symptom control.     Time  2    Period  Weeks    Status  Achieved        PT SHORT TERM GOAL #2   Title  Patient will report a 25% reduction in low back and left leg symptoms with activities requiring forward bending, such as picking an object up off the floor.    Baseline  02/15/2018 - improved for picking up object once, but not repetitively    Time  2    Period  Weeks    Status  On-going           PT Long Term Goals - 02/15/18 1055            PT LONG TERM GOAL #1   Title  Patient will be independent in performance of an advanced HEP on a regular basis for symptom control.     Time  5   due [place] 10[place]12-20/19   Period  Weeks    Status  Partially Met        PT LONG TERM GOAL #2   Title  Patient will exhibit a 10% or > improvement in FOTO score indicating an improvement in percieved function abilities.     Baseline  initial - 56% limited    Time  5    Period  Weeks     Status  On-going        PT LONG TERM GOAL #3   Title  Patient will be able to control LBP and Lt LE symptoms such that she is able to perform a 10 minute walking program 3x/wk.     Time  5    Period  Weeks    Status  Achieved     Education / Equipment: Educated on [employer] and exercises throughout.   Plan: Patient agrees to discharge.  Patient goals were partially met. Patient is being discharged due to not returning since the last visit.  ?????     Kipp Brood, PT, DPT Physical Therapist with [facility]  04/05/2018 1:48 PM    [place], [place], [address] Phone: [phone]   Fax:  [phone]

## 2018-04-20 ENCOUNTER — Other Ambulatory Visit: Payer: Self-pay | Admitting: Family Medicine

## 2018-04-20 NOTE — Telephone Encounter (Signed)
Flexeril Last rx:  12/19/17, #30/0 Last OV:  03/26/18, acute Next OV:  01/03/19, CPE

## 2018-05-19 ENCOUNTER — Other Ambulatory Visit: Payer: Self-pay | Admitting: Family Medicine

## 2018-05-21 NOTE — Telephone Encounter (Signed)
Electronic refill request Cyclobenzaprine Last refill 04/20/18 #30 Last office visit 03/26/18 Upcoming appointment 01/03/19

## 2018-06-28 ENCOUNTER — Ambulatory Visit (INDEPENDENT_AMBULATORY_CARE_PROVIDER_SITE_OTHER): Payer: BC Managed Care – PPO | Admitting: Specialist

## 2018-07-26 ENCOUNTER — Ambulatory Visit (INDEPENDENT_AMBULATORY_CARE_PROVIDER_SITE_OTHER): Payer: BC Managed Care – PPO | Admitting: Specialist

## 2018-10-01 ENCOUNTER — Other Ambulatory Visit: Payer: Self-pay | Admitting: Family Medicine

## 2018-10-03 NOTE — Telephone Encounter (Signed)
E PRESCribed

## 2018-12-28 ENCOUNTER — Telehealth: Payer: Self-pay

## 2018-12-28 NOTE — Telephone Encounter (Signed)
Left detailed VM w COVID screen and back door lab info and front door info   

## 2019-01-01 ENCOUNTER — Other Ambulatory Visit: Payer: BC Managed Care – PPO

## 2019-01-01 ENCOUNTER — Other Ambulatory Visit: Payer: Self-pay | Admitting: Family Medicine

## 2019-01-01 DIAGNOSIS — R5382 Chronic fatigue, unspecified: Secondary | ICD-10-CM

## 2019-01-01 DIAGNOSIS — I10 Essential (primary) hypertension: Secondary | ICD-10-CM

## 2019-01-01 NOTE — Telephone Encounter (Signed)
Cyclobenzaprine Last filled:  05/23/18, #30 Last OV:  03/26/18, acute Next OV:  01/03/19, CPE

## 2019-01-02 ENCOUNTER — Other Ambulatory Visit (INDEPENDENT_AMBULATORY_CARE_PROVIDER_SITE_OTHER): Payer: BC Managed Care – PPO

## 2019-01-02 ENCOUNTER — Other Ambulatory Visit: Payer: Self-pay

## 2019-01-02 DIAGNOSIS — I10 Essential (primary) hypertension: Secondary | ICD-10-CM | POA: Diagnosis not present

## 2019-01-02 DIAGNOSIS — R5382 Chronic fatigue, unspecified: Secondary | ICD-10-CM | POA: Diagnosis not present

## 2019-01-02 LAB — CBC WITH DIFFERENTIAL/PLATELET
Basophils Absolute: 0 10*3/uL (ref 0.0–0.1)
Basophils Relative: 0.6 % (ref 0.0–3.0)
Eosinophils Absolute: 0.3 10*3/uL (ref 0.0–0.7)
Eosinophils Relative: 4.7 % (ref 0.0–5.0)
HCT: 35.2 % — ABNORMAL LOW (ref 36.0–46.0)
Hemoglobin: 12.3 g/dL (ref 12.0–15.0)
Lymphocytes Relative: 26.4 % (ref 12.0–46.0)
Lymphs Abs: 1.7 10*3/uL (ref 0.7–4.0)
MCHC: 35 g/dL (ref 30.0–36.0)
MCV: 86.4 fl (ref 78.0–100.0)
Monocytes Absolute: 0.6 10*3/uL (ref 0.1–1.0)
Monocytes Relative: 8.7 % (ref 3.0–12.0)
Neutro Abs: 3.8 10*3/uL (ref 1.4–7.7)
Neutrophils Relative %: 59.6 % (ref 43.0–77.0)
Platelets: 234 10*3/uL (ref 150.0–400.0)
RBC: 4.07 Mil/uL (ref 3.87–5.11)
RDW: 12.9 % (ref 11.5–15.5)
WBC: 6.4 10*3/uL (ref 4.0–10.5)

## 2019-01-02 LAB — COMPREHENSIVE METABOLIC PANEL
ALT: 19 U/L (ref 0–35)
AST: 21 U/L (ref 0–37)
Albumin: 4.2 g/dL (ref 3.5–5.2)
Alkaline Phosphatase: 62 U/L (ref 39–117)
BUN: 16 mg/dL (ref 6–23)
CO2: 29 mEq/L (ref 19–32)
Calcium: 9.2 mg/dL (ref 8.4–10.5)
Chloride: 105 mEq/L (ref 96–112)
Creatinine, Ser: 0.68 mg/dL (ref 0.40–1.20)
GFR: 90.52 mL/min (ref >60.00)
Glucose, Bld: 100 mg/dL — ABNORMAL HIGH (ref 70–99)
Potassium: 3.4 mEq/L — ABNORMAL LOW (ref 3.5–5.1)
Sodium: 140 mEq/L (ref 135–145)
Total Bilirubin: 0.6 mg/dL (ref 0.2–1.2)
Total Protein: 7 g/dL (ref 6.0–8.3)

## 2019-01-03 ENCOUNTER — Ambulatory Visit (INDEPENDENT_AMBULATORY_CARE_PROVIDER_SITE_OTHER): Payer: BC Managed Care – PPO | Admitting: Family Medicine

## 2019-01-03 ENCOUNTER — Encounter: Payer: Self-pay | Admitting: Family Medicine

## 2019-01-03 ENCOUNTER — Other Ambulatory Visit: Payer: Self-pay

## 2019-01-03 VITALS — BP 124/72 | HR 66 | Temp 97.8°F | Ht 67.25 in | Wt 187.2 lb

## 2019-01-03 DIAGNOSIS — Z981 Arthrodesis status: Secondary | ICD-10-CM

## 2019-01-03 DIAGNOSIS — I10 Essential (primary) hypertension: Secondary | ICD-10-CM

## 2019-01-03 DIAGNOSIS — R1319 Other dysphagia: Secondary | ICD-10-CM

## 2019-01-03 DIAGNOSIS — M4802 Spinal stenosis, cervical region: Secondary | ICD-10-CM

## 2019-01-03 DIAGNOSIS — R079 Chest pain, unspecified: Secondary | ICD-10-CM

## 2019-01-03 DIAGNOSIS — K219 Gastro-esophageal reflux disease without esophagitis: Secondary | ICD-10-CM

## 2019-01-03 DIAGNOSIS — Z0001 Encounter for general adult medical examination with abnormal findings: Secondary | ICD-10-CM

## 2019-01-03 DIAGNOSIS — M4810 Ankylosing hyperostosis [Forestier], site unspecified: Secondary | ICD-10-CM

## 2019-01-03 DIAGNOSIS — Z23 Encounter for immunization: Secondary | ICD-10-CM

## 2019-01-03 MED ORDER — LOSARTAN POTASSIUM-HCTZ 100-12.5 MG PO TABS: 1.0000 | ORAL_TABLET | Freq: Every day | ORAL | 3 refills | Status: DC

## 2019-01-03 MED ORDER — AMLODIPINE BESYLATE 10 MG PO TABS: 10.0000 mg | ORAL_TABLET | Freq: Every day | ORAL | 3 refills | Status: DC

## 2019-01-03 NOTE — Progress Notes (Signed)
This visit was conducted in person.  BP 124/72 (BP Location: Left Arm, Patient Position: Sitting, Cuff Size: Normal)   Pulse 66   Temp 97.8 F (36.6 C) (Tympanic)   Ht 5' 7.25" (1.708 m)   Wt 187 lb 4 oz (84.9 kg)   SpO2 99%   BMI 29.11 kg/m    CC: CPE Subjective:    Patient ID: Carolyn [name], female    DOB: 10-29-1965, 53 y.o.   MRN: [id number]  HPI: Carolyn [name] is a 53 y.o. female presenting on 01/03/2019 for Annual Exam   Parents currently sick - father has Parkinson's. They may move in with her.  Daughter got married last weekend.   Noticing chest pain several times a week - describes sharp upper chest pain that can last a few minutes. Comes on at rest, not exertional. Waits it out, gets better on its own. No pressure/tightness. No GERD symptoms - controlled with duexis.   Fell down stairs this week. She hit her R forehead, R shoulder, ribs and knee. Recovering well, still with persistent rib pain. Broke glasses.   Preventative: COLONOSCOPY 12/2016 - 1 SSP, rpt 5 yrs - trouble tolerating prep (Nandigam) Well woman with OBGYNDr [facility] in [place] last 2016 - released from care. S/p hysterectomy, ovaries remain.  Mammogram - overdue.  Flu shot - doesn't receive  Tdap - 10/2016 zostavax 2017 shingrix - pt interested - started today Seat belt use discussed Sunscreen use discussed. No changing moles on skin. Sees Dr Nevada Crane. 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 Interim medical history since our last visit reviewed. Allergies and medications  reviewed and updated. Outpatient Medications Prior to Visit  Medication Sig Dispense Refill  . cyclobenzaprine (FLEXERIL) 10 MG tablet TAKE 1/2 TO 1 TABLET BY MOUTH TWICE DAILY AS NEEDED FOR MUSCLE SPASMS 30 tablet 3  . Ibuprofen-Famotidine 800-26.6 MG TABS Take 1 tablet by mouth 2 (two) times daily after a meal. 180 tablet 3  . Psyllium (METAMUCIL FIBER PO) Take by mouth as needed.    . traMADol (ULTRAM) 50 MG tablet TAKE 1 TABLET BY MOUTH EVERY 8 HOURS AS NEEDED 20 tablet 0  . amLODipine (NORVASC) 10 MG tablet Take 1 tablet (10 mg total) by mouth daily. 90 tablet 3  . losartan-hydrochlorothiazide (HYZAAR) 100-12.5 MG tablet Take 1 tablet by mouth daily. 90 tablet 3   No facility-administered medications prior to visit.      Per HPI unless specifically indicated in ROS section below Review of Systems  Constitutional: Negative for activity change, appetite change, chills, fatigue, fever and unexpected weight change.  HENT: Negative for hearing loss.   Eyes: Negative for visual disturbance.  Respiratory: Negative for cough, chest tightness, shortness of breath and wheezing.   Cardiovascular: Positive for chest pain (see HPI) and leg swelling (wears compression stockings). Negative for palpitations.  Gastrointestinal: Positive for blood in stool (hemorrhoid related) and constipation. Negative for abdominal distention, abdominal pain, diarrhea, nausea and vomiting.  Genitourinary: Negative for difficulty urinating and hematuria.  Musculoskeletal: Negative for arthralgias, myalgias and neck pain.  Skin: Negative for rash.  Neurological: Negative for dizziness, seizures, syncope and headaches.  Hematological: Negative for adenopathy. Does not bruise/bleed easily.  Psychiatric/Behavioral: Negative for dysphoric mood. The patient is not nervous/anxious.    Objective:    BP 124/72 (BP Location: Left Arm, Patient Position: Sitting, Cuff Size: Normal)   Pulse 66   Temp 97.8 F (36.6 C)  (Tympanic)   Ht 5' 7.25" (1.708 m)   Wt 187 lb 4 oz (84.9 kg)   SpO2 99%   BMI 29.11 kg/m   Wt Readings from Last 3 Encounters:  01/03/19 187 lb 4 oz (84.9 kg)  03/26/18 191 lb 4 oz (86.8 kg)  12/19/17 197 lb 12 oz (89.7 kg)    Physical Exam Vitals signs and nursing note reviewed.  Constitutional:      General: She is not in acute distress.    Appearance: Normal appearance. She is well-developed. She is not ill-appearing.  HENT:     Head: Normocephalic and atraumatic.     Right Ear: Hearing, tympanic membrane, ear canal and external ear normal.     Left Ear: Hearing, tympanic membrane, ear canal and external ear normal.     Nose: Nose normal.     Mouth/Throat:     Mouth: Mucous membranes are moist.     Pharynx: Uvula midline. No oropharyngeal exudate or posterior oropharyngeal erythema.  Eyes:     General: No scleral icterus.    Extraocular Movements: Extraocular movements intact.     Conjunctiva/sclera: Conjunctivae normal.     Pupils: Pupils are equal, round, and reactive to light.  Neck:     Musculoskeletal: Normal range of motion and neck supple.  Cardiovascular:     Rate and Rhythm: Normal rate and regular rhythm.     Pulses: Normal pulses.          Radial pulses are 2+ on the right side and 2+ on the left side.     Heart sounds: Normal heart sounds. No murmur.  Pulmonary:     Effort: Pulmonary effort is normal. No respiratory distress.     Breath sounds: Normal breath sounds. No wheezing, rhonchi or rales.     Comments: No reproducible chest wall tenderness Chest:     Chest wall: No tenderness.  Abdominal:     General: Abdomen is flat. Bowel sounds are normal. There is no distension.     Palpations: Abdomen is soft. There is no mass.     Tenderness: There is no abdominal tenderness. There is no guarding or rebound.     Hernia: No hernia is present.  Musculoskeletal: Normal range of motion.     Right lower leg: No edema.     Left lower leg: No edema.   Lymphadenopathy:     Cervical: No cervical adenopathy.  Skin:    General: Skin is warm and dry.     Findings: No rash.  Neurological:     General: No focal deficit present.     Mental Status: She is alert and oriented to person, place, and time.     Comments: CN grossly intact, station and gait intact  Psychiatric:        Mood and Affect: Mood normal.        Behavior: Behavior normal.        Thought Content: Thought content normal.        Judgment: Judgment normal.       Results for orders placed or performed in visit on 01/02/19  Comprehensive  metabolic panel  Result Value Ref Range   Sodium 140 135 - 145 mEq/L   Potassium 3.4 (L) 3.5 - 5.1 mEq/L   Chloride 105 96 - 112 mEq/L   CO2 29 19 - 32 mEq/L   Glucose, Bld 100 (H) 70 - 99 mg/dL   BUN 16 6 - 23 mg/dL   Creatinine, Ser 0.68 0.40 - 1.20 mg/dL   Total Bilirubin 0.6 0.2 - 1.2 mg/dL   Alkaline Phosphatase 62 39 - 117 U/L   AST 21 0 - 37 U/L   ALT 19 0 - 35 U/L   Total Protein 7.0 6.0 - 8.3 g/dL   Albumin 4.2 3.5 - 5.2 g/dL   Calcium 9.2 8.4 - 10.5 mg/dL   GFR 90.52 >60.00 mL/min  CBC with Differential/Platelet  Result Value Ref Range   WBC 6.4 4.0 - 10.5 K/uL   RBC 4.07 3.87 - 5.11 Mil/uL   Hemoglobin 12.3 12.0 - 15.0 g/dL   HCT 35.2 (L) 36.0 - 46.0 %   MCV 86.4 78.0 - 100.0 fl   MCHC 35.0 30.0 - 36.0 g/dL   RDW 12.9 11.5 - 15.5 %   Platelets 234.0 150.0 - 400.0 K/uL   Neutrophils Relative % 59.6 43.0 - 77.0 %   Lymphocytes Relative 26.4 12.0 - 46.0 %   Monocytes Relative 8.7 3.0 - 12.0 %   Eosinophils Relative 4.7 0.0 - 5.0 %   Basophils Relative 0.6 0.0 - 3.0 %   Neutro Abs 3.8 1.4 - 7.7 K/uL   Lymphs Abs 1.7 0.7 - 4.0 K/uL   Monocytes Absolute 0.6 0.1 - 1.0 K/uL   Eosinophils Absolute 0.3 0.0 - 0.7 K/uL   Basophils Absolute 0.0 0.0 - 0.1 K/uL   EKG - sinus bradycardia rate 50s, normal axis, intervals, no acute ST/T changes Assessment & Plan:   Problem List Items Addressed This Visit      Chronic    Spinal stenosis in cervical region     Diagnosis of   Other dysphagia (Chronic)   DISH (diffuse idiopathic skeletal hyperostosis)     Other   S/P cervical spinal fusion   Hypertension    Chronic, stable. Continue current regimen.       Relevant Medications   amLODipine (NORVASC) 10 MG tablet   losartan-hydrochlorothiazide (HYZAAR) 100-12.5 MG tablet   GERD (gastroesophageal reflux disease)    Continue duexis.       Encounter for routine adult health examination with abnormal findings - Primary    Preventative protocols reviewed and updated unless pt declined. Discussed healthy diet and lifestyle.       Chest pain    Not reproducible but also does not describe cardiac characteristics. ?GERD related vs MSK (referred from thoracic spine). Will update EKG today - overall reassuring.      Relevant Orders   EKG 12-Lead (Completed)    Other Visit Diagnoses    Need for shingles vaccine       Relevant Orders   Varicella-zoster vaccine IM (Completed)       Meds ordered this encounter  Medications  . amLODipine (NORVASC) 10 MG tablet    Sig: Take 1 tablet (10 mg total) by mouth daily.    Dispense:  90 tablet    Refill:  3  . losartan-hydrochlorothiazide (HYZAAR) 100-12.5 MG tablet    Sig: Take 1 tablet by mouth daily.    Dispense:  90 tablet    Refill:  3   Orders Placed This Encounter  Procedures  .  Varicella-zoster vaccine IM  . EKG 12-Lead    Patient instructions: EKG today.  First shingrix shot today - schedule nurse visit for second.  Good to see you today, call us with questions. Medicines refilled today Return as needed or in 1 year for next physical.  We will refer you for mammogram   Follow up plan: No follow-ups on file.  Ria Bush, MD

## 2019-01-03 NOTE — Patient Instructions (Addendum)
EKG today.  First shingrix shot today - schedule nurse visit for second.  Good to see you today, call us with questions. Medicines refilled today Return as needed or in 1 year for next physical.  We will refer you for mammogram   Health Maintenance for Postmenopausal Women Menopause is a normal process in which your ability to get pregnant comes to an end. This process happens slowly over many months or years, usually between the ages of 61 and 33. Menopause is complete when you have missed your menstrual periods for 12 months. It is important to talk with your health care provider about some of the most common conditions that affect women after menopause (postmenopausal women). These include heart disease, cancer, and bone loss (osteoporosis). Adopting a healthy lifestyle and getting preventive care can help to promote your health and wellness. The actions you take can also lower your chances of developing some of these common conditions. What should I know about menopause? During menopause, you may get a number of symptoms, such as:  Hot flashes. These can be moderate or severe.  Night sweats.  Decrease in sex drive.  Mood swings.  Headaches.  Tiredness.  Irritability.  Memory problems.  Insomnia. Choosing to treat or not to treat these symptoms is a decision that you make with your health care provider. Do I need hormone replacement therapy?  Hormone replacement therapy is effective in treating symptoms that are caused by menopause, such as hot flashes and night sweats.  Hormone replacement carries certain risks, especially as you become older. If you are thinking about using estrogen or estrogen with progestin, discuss the benefits and risks with your health care provider. What is my risk for heart disease and stroke? The risk of heart disease, heart attack, and stroke increases as you age. One of the causes may be a change in the body's hormones during menopause. This can  affect how your body uses dietary fats, triglycerides, and cholesterol. Heart attack and stroke are medical emergencies. There are many things that you can do to help prevent heart disease and stroke. Watch your blood pressure  High blood pressure causes heart disease and increases the risk of stroke. This is more likely to develop in people who have high blood pressure readings, are of African descent, or are overweight.  Have your blood pressure checked: ? Every 3-5 years if you are 66-84 years of age. ? Every year if you are 22 years old or older. Eat a healthy diet   Eat a diet that includes plenty of vegetables, fruits, low-fat dairy products, and lean protein.  Do not eat a lot of foods that are high in solid fats, added sugars, or sodium. Get regular exercise Get regular exercise. This is one of the most important things you can do for your health. Most adults should:  Try to exercise for at least 150 minutes each week. The exercise should increase your heart rate and make you sweat (moderate-intensity exercise).  Try to do strengthening exercises at least twice each week. Do these in addition to the moderate-intensity exercise.  Spend less time sitting. Even light physical activity can be beneficial. Other tips  Work with your health care provider to achieve or maintain a healthy weight.  Do not use any products that contain nicotine or tobacco, such as cigarettes, e-cigarettes, and chewing tobacco. If you need help quitting, ask your health care provider.  Know your numbers. Ask your health care provider to check your cholesterol and  your blood sugar (glucose). Continue to have your blood tested as directed by your health care provider. Do I need screening for cancer? Depending on your health history and family history, you may need to have cancer screening at different stages of your life. This may include screening for:  Breast cancer.  Cervical cancer.  Lung cancer.   Colorectal cancer. What is my risk for osteoporosis? After menopause, you may be at increased risk for osteoporosis. Osteoporosis is a condition in which bone destruction happens more quickly than new bone creation. To help prevent osteoporosis or the bone fractures that can happen because of osteoporosis, you may take the following actions:  If you are 64-49 years old, get at least 1,000 mg of calcium and at least 600 mg of vitamin D per day.  If you are older than age 30 but younger than age 55, get at least 1,200 mg of calcium and at least 600 mg of vitamin D per day.  If you are older than age 69, get at least 1,200 mg of calcium and at least 800 mg of vitamin D per day. Smoking and drinking excessive alcohol increase the risk of osteoporosis. Eat foods that are rich in calcium and vitamin D, and do weight-bearing exercises several times each week as directed by your health care provider. How does menopause affect my mental health? Depression may occur at any age, but it is more common as you become older. Common symptoms of depression include:  Low or sad mood.  Changes in sleep patterns.  Changes in appetite or eating patterns.  Feeling an overall lack of motivation or enjoyment of activities that you previously enjoyed.  Frequent crying spells. Talk with your health care provider if you think that you are experiencing depression. General instructions See your health care provider for regular wellness exams and vaccines. This may include:  Scheduling regular health, dental, and eye exams.  Getting and maintaining your vaccines. These include: ? Influenza vaccine. Get this vaccine each year before the flu season begins. ? Pneumonia vaccine. ? Shingles vaccine. ? Tetanus, diphtheria, and pertussis (Tdap) booster vaccine. Your health care provider may also recommend other immunizations. Tell your health care provider if you have ever been abused or do not feel safe at home.  Summary  Menopause is a normal process in which your ability to get pregnant comes to an end.  This condition causes hot flashes, night sweats, decreased interest in sex, mood swings, headaches, or lack of sleep.  Treatment for this condition may include hormone replacement therapy.  Take actions to keep yourself healthy, including exercising regularly, eating a healthy diet, watching your weight, and checking your blood pressure and blood sugar levels.  Get screened for cancer and depression. Make sure that you are up to date with all your vaccines. This information is not intended to replace advice given to you by your health care provider. Make sure you discuss any questions you have with your health care provider. Document Released: 06/03/2005 Document Revised: 04/04/2018 Document Reviewed: 04/04/2018 Elsevier Patient Education  2020 [employer].

## 2019-01-05 NOTE — Assessment & Plan Note (Signed)
Chronic, stable. Continue current regimen. 

## 2019-01-05 NOTE — Assessment & Plan Note (Signed)
Preventative protocols reviewed and updated unless pt declined. Discussed healthy diet and lifestyle.  

## 2019-01-05 NOTE — Assessment & Plan Note (Addendum)
Not reproducible but also does not describe cardiac characteristics. ?GERD related vs MSK (referred from thoracic spine). Will update EKG today - overall reassuring.

## 2019-01-05 NOTE — Assessment & Plan Note (Signed)
Continue duexis.

## 2019-02-11 ENCOUNTER — Other Ambulatory Visit: Payer: Self-pay | Admitting: Family Medicine

## 2019-03-28 ENCOUNTER — Ambulatory Visit (INDEPENDENT_AMBULATORY_CARE_PROVIDER_SITE_OTHER): Payer: BC Managed Care – PPO

## 2019-03-28 ENCOUNTER — Other Ambulatory Visit: Payer: Self-pay

## 2019-03-28 DIAGNOSIS — Z23 Encounter for immunization: Secondary | ICD-10-CM

## 2019-04-29 ENCOUNTER — Other Ambulatory Visit: Payer: Self-pay | Admitting: Family Medicine

## 2019-04-30 ENCOUNTER — Other Ambulatory Visit: Payer: BC Managed Care – PPO

## 2019-04-30 ENCOUNTER — Ambulatory Visit: Payer: BC Managed Care – PPO | Attending: Internal Medicine

## 2019-04-30 ENCOUNTER — Other Ambulatory Visit: Payer: Self-pay

## 2019-04-30 DIAGNOSIS — Z20822 Contact with and (suspected) exposure to covid-19: Secondary | ICD-10-CM

## 2019-04-30 NOTE — Telephone Encounter (Signed)
Last filled on 10/03/2018 #20 with 0 refill. LOV 01/03/2019 CPE Next appointment on 01/06/2020  CPe

## 2019-05-01 NOTE — Telephone Encounter (Signed)
ERx 

## 2019-05-02 LAB — NOVEL CORONAVIRUS, NAA: SARS-CoV-2, NAA: NOT DETECTED

## 2019-06-14 ENCOUNTER — Ambulatory Visit: Payer: BC Managed Care – PPO | Admitting: Specialist

## 2019-06-17 ENCOUNTER — Encounter: Payer: Self-pay | Admitting: Family Medicine

## 2019-06-17 DIAGNOSIS — Z1231 Encounter for screening mammogram for malignant neoplasm of breast: Secondary | ICD-10-CM

## 2019-06-20 ENCOUNTER — Other Ambulatory Visit: Payer: Self-pay

## 2019-06-20 ENCOUNTER — Ambulatory Visit (HOSPITAL_COMMUNITY)
Admission: RE | Admit: 2019-06-20 | Discharge: 2019-06-20 | Disposition: A | Discharge status: Home / Self Care | Payer: BC Managed Care – PPO | Source: Ambulatory Visit | Attending: Family Medicine | Admitting: Family Medicine

## 2019-06-20 DIAGNOSIS — Z1231 Encounter for screening mammogram for malignant neoplasm of breast: Secondary | ICD-10-CM | POA: Insufficient documentation

## 2019-06-24 ENCOUNTER — Other Ambulatory Visit (HOSPITAL_COMMUNITY): Payer: Self-pay | Admitting: Family Medicine

## 2019-06-24 DIAGNOSIS — R928 Other abnormal and inconclusive findings on diagnostic imaging of breast: Secondary | ICD-10-CM

## 2019-06-29 ENCOUNTER — Encounter: Payer: Self-pay | Admitting: Family Medicine

## 2019-06-29 NOTE — Telephone Encounter (Signed)
Can we see if we can get patient set up for dx mammo/US sooner than 07/2019 (will need a different site than [facility]).

## 2019-07-01 ENCOUNTER — Encounter: Payer: Self-pay | Admitting: Specialist

## 2019-07-01 ENCOUNTER — Ambulatory Visit: Payer: BC Managed Care – PPO | Admitting: Specialist

## 2019-07-01 ENCOUNTER — Ambulatory Visit: Payer: Self-pay

## 2019-07-01 ENCOUNTER — Other Ambulatory Visit: Payer: Self-pay

## 2019-07-01 VITALS — BP 131/82 | HR 65 | Ht 67.25 in | Wt 200.2 lb

## 2019-07-01 DIAGNOSIS — M4802 Spinal stenosis, cervical region: Secondary | ICD-10-CM | POA: Diagnosis not present

## 2019-07-01 DIAGNOSIS — Z981 Arthrodesis status: Secondary | ICD-10-CM

## 2019-07-01 NOTE — Progress Notes (Signed)
Office Visit Note   Patient: Carolyn [name]           Date of Birth: [birth year]           MRN: [id number] Visit Date: 07/01/2019              Requested by: Ria Bush, MD [place],  [place] [address] PCP: Ria Bush, MD   Assessment & Plan: Visit Diagnoses:  1. Spinal stenosis in cervical region   2. S/P cervical spinal fusion     Plan: Avoid overhead lifting and overhead use of the arms. Do not lift greater than 5 lbs. Adjust head rest in vehicle to prevent hyperextension if rear ended. Take extra precautions to avoid falling.  If the pain persists consider allowing an MRI to assess for injury otherwise expect pain to improve as healing Takes place persistence of pain can be a sign of difficulty healing a spine injury due to the stiffness of the spine.   Follow-Up Instructions: No follow-ups on file.   Orders:  Orders Placed This Encounter  Procedures  . XR Cervical Spine 2 or 3 views   No orders of the defined types were placed in this encounter.     Procedures: No procedures performed   Clinical Data: No additional findings.   Subjective: Chief Complaint  Patient presents with  . Neck - Pain    HPI  Review of Systems   Objective: Vital Signs: BP 131/82 (BP Location: Left Arm, Patient Position: Sitting)   Pulse 65   Ht 5' 7.25" (1.708 m)   Wt 200 lb 3.2 oz (90.8 kg)   BMI 31.12 kg/m   Physical Exam  Ortho Exam  Specialty Comments:  No specialty comments available.  Imaging: No results found.   PMFS History: Patient Active Problem List   Diagnosis Date Noted  . Esophageal tear 03/04/2015    Priority: High    Class: Acute  . HNP (herniated nucleus pulposus) with myelopathy, cervical 03/03/2015    Priority: High    Class: Chronic  . Spinal stenosis in cervical region 03/03/2015    Priority: High    Class: Chronic  . Chronic low back pain without sciatica 12/23/2017  . Posterior left knee pain  12/23/2017  . Encounter for routine adult health examination with abnormal findings 11/22/2016  . Obesity, Class I, BMI 30-34.9 11/22/2016  . GERD (gastroesophageal reflux disease) 09/08/2016  . Chronic fatigue 09/08/2016  . Urge incontinence   . Family history of DVT 03/05/2015  . Chronic venous insufficiency 03/05/2015  . S/P cervical spinal fusion 03/03/2015  . Chest pain 12/18/2012  . Hypertension   . Heart murmur   . Dry eyes   . DISH (diffuse idiopathic skeletal hyperostosis) 08/31/2012    Class: Diagnosis of  . Other dysphagia 08/31/2012    Class: Diagnosis of   Past Medical History:  Diagnosis Date  . Anxiety   . Arthritis    patient unsure  . Depression    but doesn't require any meds  . Diffuse idiopathic skeletal hyperostosis   . Dry eyes    uses Restasis bid  . Esophageal tear 03/04/2015   Left pyriformis sinus.   [name] GERD (gastroesophageal reflux disease)   . HCAP (healthcare-associated pneumonia) 03/05/2015  . Headache(784.0)    MRI in 2007 bc of headaches  . Heart murmur    New diagnosis  . History of chicken pox   . Hypertension    takes Hyzaar daily  .  Neck pain    bone spur on esophagus  . PONV (postoperative nausea and vomiting) 2014   nausea and vomiting   . Spinal stenosis in cervical region 03/03/2015  . Urge incontinence     Family History  Problem Relation Age of Onset  . Heart murmur Father   . Hypertension Father   . Cancer Father        skin  . Colon polyps Father   . Hypertension Mother   . Cancer Paternal Aunt        uterine  . Cancer Maternal Grandmother        lung (non smoker)  . Cancer Maternal Grandfather 80       stomach  . Cancer Paternal Grandmother        lung (smoker)  . Diabetes Neg Hx   . CAD Neg Hx   . Stroke Neg Hx     Past Surgical History:  Procedure Laterality Date  . ABDOMINAL HYSTERECTOMY  2011   bleeding - ovaries remain  . ANTERIOR CERVICAL DECOMP/DISCECTOMY FUSION N/A 08/21/2012   Procedure: Excision  of exostosis C2-3,C3-4, C4-5;  Surgeon: Jessy Oto, MD;  Location: [facility];  Service: Orthopedics;  Laterality: N/A;  . ANTERIOR CERVICAL DECOMP/DISCECTOMY FUSION N/A 03/03/2015   Procedure: ANTERIOR CERVICAL DISCECTOMY AND FUSION WITH PARTIAL VERTEBRECTOMY C3 AND C4, CERVICAL PLATE AND SCREWS, ALLOGRAFT, LOCAL BONE GRAFT, VIVIGEN;  Surgeon: Jessy Oto, MD;  Location: [facility];  Service: Orthopedics;  Laterality: N/A;  . COLONOSCOPY  12/2016   1 SSP, rpt 5 yrs - trouble tolerating prep (Nandigam)  . DIRECT LARYNGOSCOPY  03/03/2015   Procedure: DIRECT LARYNGOSCOPY AND PLACEMENT OF FEEDING TUBE;  Surgeon: [name], MD;  Location: [facility];  Service: ENT;;  . ESOPHAGOGASTRODUODENOSCOPY  12/2013   WNL ([place])  . REPAIR OF ESOPHAGUS  03/03/2015   Procedure: REPAIR OF ESOPHAGUS;  Surgeon: [name], MD;  Location: [facility];  Service: ENT;;   Social History   Occupational History    Comment: Extension Agent, [employer]  Tobacco Use  . Smoking status: Never Smoker  . Smokeless tobacco: Never Used  Substance and Sexual Activity  . Alcohol use: Yes    Comment: rarely  . Drug use: No  . Sexual activity: Yes    Birth control/protection: Surgical

## 2019-07-01 NOTE — Patient Instructions (Signed)
Plan: Avoid overhead lifting and overhead use of the arms. Do not lift greater than 5 lbs. Adjust head rest in vehicle to prevent hyperextension if rear ended. Take extra precautions to avoid falling.  If the pain persists consider allowing an MRI to assess for injury otherwise expect pain to improve as healing Takes place persistence of pain can be a sign of difficulty healing a spine injury due to the stiffness of the spine.

## 2019-08-13 ENCOUNTER — Ambulatory Visit (HOSPITAL_COMMUNITY)
Admission: RE | Admit: 2019-08-13 | Discharge: 2019-08-13 | Disposition: A | Discharge status: Home / Self Care | Payer: BC Managed Care – PPO | Source: Ambulatory Visit | Attending: Family Medicine | Admitting: Family Medicine

## 2019-08-13 ENCOUNTER — Encounter (HOSPITAL_COMMUNITY): Payer: Self-pay

## 2019-08-13 ENCOUNTER — Other Ambulatory Visit: Payer: Self-pay

## 2019-08-13 DIAGNOSIS — R928 Other abnormal and inconclusive findings on diagnostic imaging of breast: Secondary | ICD-10-CM

## 2019-10-04 ENCOUNTER — Other Ambulatory Visit: Payer: Self-pay

## 2019-10-04 ENCOUNTER — Ambulatory Visit: Payer: BC Managed Care – PPO | Admitting: Family Medicine

## 2019-10-04 ENCOUNTER — Encounter: Payer: Self-pay | Admitting: Family Medicine

## 2019-10-04 VITALS — BP 144/80 | HR 71 | Temp 97.9°F | Wt 197.0 lb

## 2019-10-04 DIAGNOSIS — W57XXXA Bitten or stung by nonvenomous insect and other nonvenomous arthropods, initial encounter: Secondary | ICD-10-CM | POA: Diagnosis not present

## 2019-10-04 DIAGNOSIS — S80862A Insect bite (nonvenomous), left lower leg, initial encounter: Secondary | ICD-10-CM | POA: Diagnosis not present

## 2019-10-04 DIAGNOSIS — L723 Sebaceous cyst: Secondary | ICD-10-CM | POA: Diagnosis not present

## 2019-10-04 NOTE — Progress Notes (Signed)
This visit was conducted in person.  BP (!) 144/80   Pulse 71   Temp 97.9 F (36.6 C) (Temporal)   Wt 197 lb (89.4 kg)   SpO2 98%   BMI 30.63 kg/m    CC: itching Subjective:    Patient ID: Carolyn [name], female    DOB: 22-Oct-1965, 54 y.o.   MRN: [id number]  HPI: Carolyn [name] is a 54 y.o. female presenting on 10/04/2019 for Itching   She works outdoors regularly.   Tick bite Tuesday was unable to fully removed so 54 yo helped her fully remove it. Daughter found another tick on back - unsure how long present. Very itchy.   Monday night caught a bat in the house but denies any bat bites or scratches.   Denies fevers/chills, skin rash, abd pain, nausea, new joint pains, headache.      Relevant past medical, surgical, family and social history reviewed and updated as indicated. Interim medical history since our last visit reviewed. Allergies and medications reviewed and updated. Outpatient Medications Prior to Visit  Medication Sig Dispense Refill  . amLODipine (NORVASC) 10 MG tablet TAKE 1 TABLET BY MOUTH DAILY 90 tablet 3  . cyclobenzaprine (FLEXERIL) 10 MG tablet TAKE 1/2 TO 1 TABLET BY MOUTH TWICE DAILY AS NEEDED FOR MUSCLE SPASMS 30 tablet 3  . diphenhydrAMINE (BENADRYL) 25 MG tablet Take 25 mg by mouth as needed.    . Ibuprofen-Famotidine 800-26.6 MG TABS Take 1 tablet by mouth 2 (two) times daily after a meal. 180 tablet 3  . losartan-hydrochlorothiazide (HYZAAR) 100-12.5 MG tablet TAKE 1 TABLET BY MOUTH DAILY 90 tablet 3  . Psyllium (METAMUCIL FIBER PO) Take by mouth as needed.    . traMADol (ULTRAM) 50 MG tablet TAKE 1 TABLET BY MOUTH EVERY 8 HOURS AS NEEDED 20 tablet 0   No facility-administered medications prior to visit.     Per HPI unless specifically indicated in ROS section below Review of Systems Objective:  BP (!) 144/80   Pulse 71   Temp 97.9 F (36.6 C) (Temporal)   Wt 197 lb (89.4 kg)   SpO2 98%   BMI 30.63 kg/m   Wt Readings from Last 3  Encounters:  10/04/19 197 lb (89.4 kg)  07/01/19 200 lb 3.2 oz (90.8 kg)  01/03/19 187 lb 4 oz (84.9 kg)      Physical Exam Vitals and nursing note reviewed.  Constitutional:      Appearance: Normal appearance. She is not ill-appearing.  Skin:    General: Skin is warm and dry.     Findings: Erythema present. No rash.          Comments: Mild erythema surrounding black head/cyst under skin of mid left back. Area surrounding cyst expressed and cyst wall/small amount of sebum removed  Neurological:     Mental Status: She is alert.  Psychiatric:        Mood and Affect: Mood normal.        Behavior: Behavior normal.       Assessment & Plan:  This visit occurred during the SARS-CoV-2 public health emergency.  Safety protocols were in place, including screening questions prior to the visit, additional usage of staff PPE, and extensive cleaning of exam room while observing appropriate contact time as indicated for disinfecting solutions.   Problem List Items Addressed This Visit    Tick bite of lower leg, left, initial encounter    Tick borne illness symptoms reviewed.  Sebaceous cyst - Primary    Small inflamed sebaceous cyst removed, area cleaned with alcohol and dressed with abx ointment - pt tolerated well.          No orders of the defined types were placed in this encounter.  No orders of the defined types were placed in this encounter.   Patient Instructions  Small inflamed sebaceous cyst removed    Follow up plan: Return if symptoms worsen or fail to improve.  Ria Bush, MD

## 2019-10-04 NOTE — Assessment & Plan Note (Addendum)
Small inflamed sebaceous cyst removed, area cleaned with alcohol and dressed with abx ointment - pt tolerated well.

## 2019-10-04 NOTE — Patient Instructions (Addendum)
Small inflamed sebaceous cyst removed

## 2019-10-04 NOTE — Assessment & Plan Note (Signed)
Tick borne illness symptoms reviewed.

## 2020-01-02 ENCOUNTER — Other Ambulatory Visit: Payer: Self-pay | Admitting: Family Medicine

## 2020-01-02 ENCOUNTER — Other Ambulatory Visit (INDEPENDENT_AMBULATORY_CARE_PROVIDER_SITE_OTHER): Payer: BC Managed Care – PPO

## 2020-01-02 ENCOUNTER — Other Ambulatory Visit: Payer: Self-pay

## 2020-01-02 DIAGNOSIS — Z1159 Encounter for screening for other viral diseases: Secondary | ICD-10-CM

## 2020-01-02 DIAGNOSIS — I10 Essential (primary) hypertension: Secondary | ICD-10-CM | POA: Diagnosis not present

## 2020-01-02 LAB — BASIC METABOLIC PANEL
BUN: 18 mg/dL (ref 6–23)
CO2: 29 mEq/L (ref 19–32)
Calcium: 9.4 mg/dL (ref 8.4–10.5)
Chloride: 104 mEq/L (ref 96–112)
Creatinine, Ser: 0.79 mg/dL (ref 0.40–1.20)
GFR: 75.85 mL/min (ref >60.00)
Glucose, Bld: 99 mg/dL (ref 70–99)
Potassium: 3.7 mEq/L (ref 3.5–5.1)
Sodium: 141 mEq/L (ref 135–145)

## 2020-01-03 LAB — HEPATITIS C ANTIBODY
Hepatitis C Ab: NONREACTIVE
SIGNAL TO CUT-OFF: 0.01 (ref <1.00)

## 2020-01-06 ENCOUNTER — Other Ambulatory Visit: Payer: Self-pay

## 2020-01-06 ENCOUNTER — Encounter: Payer: Self-pay | Admitting: Family Medicine

## 2020-01-06 ENCOUNTER — Ambulatory Visit (INDEPENDENT_AMBULATORY_CARE_PROVIDER_SITE_OTHER): Payer: BC Managed Care – PPO | Admitting: Family Medicine

## 2020-01-06 VITALS — BP 120/72 | HR 64 | Temp 97.9°F | Ht 67.0 in | Wt 206.5 lb

## 2020-01-06 DIAGNOSIS — I10 Essential (primary) hypertension: Secondary | ICD-10-CM | POA: Diagnosis not present

## 2020-01-06 DIAGNOSIS — M4810 Ankylosing hyperostosis [Forestier], site unspecified: Secondary | ICD-10-CM | POA: Diagnosis not present

## 2020-01-06 DIAGNOSIS — R079 Chest pain, unspecified: Secondary | ICD-10-CM

## 2020-01-06 DIAGNOSIS — Z0001 Encounter for general adult medical examination with abnormal findings: Secondary | ICD-10-CM

## 2020-01-06 DIAGNOSIS — E669 Obesity, unspecified: Secondary | ICD-10-CM

## 2020-01-06 DIAGNOSIS — E66811 Obesity, class 1: Secondary | ICD-10-CM

## 2020-01-06 DIAGNOSIS — I872 Venous insufficiency (chronic) (peripheral): Secondary | ICD-10-CM

## 2020-01-06 DIAGNOSIS — M4802 Spinal stenosis, cervical region: Secondary | ICD-10-CM

## 2020-01-06 MED ORDER — TRAMADOL HCL 50 MG PO TABS
50.0000 mg | ORAL_TABLET | Freq: Three times a day (TID) | ORAL | 0 refills | Status: DC | PRN
Start: 2020-01-06 — End: 2020-03-23

## 2020-01-06 MED ORDER — AMLODIPINE BESYLATE 10 MG PO TABS
10.0000 mg | ORAL_TABLET | Freq: Every day | ORAL | 3 refills | Status: DC
Start: 2020-01-06 — End: 2021-01-06

## 2020-01-06 MED ORDER — LOSARTAN POTASSIUM-HCTZ 100-12.5 MG PO TABS
1.0000 | ORAL_TABLET | Freq: Every day | ORAL | 3 refills | Status: DC
Start: 2020-01-06 — End: 2021-01-06

## 2020-01-06 MED ORDER — CYCLOBENZAPRINE HCL 10 MG PO TABS
ORAL_TABLET | ORAL | 3 refills | Status: DC
Start: 2020-01-06 — End: 2021-01-06

## 2020-01-06 NOTE — Assessment & Plan Note (Signed)
Chronic, stable. Continue current regimen. 

## 2020-01-06 NOTE — Assessment & Plan Note (Signed)
Weight gain noted.

## 2020-01-06 NOTE — Assessment & Plan Note (Addendum)
Ongoing chest discomfort with some atyipcal, some concerning features (tightness worse with exertion ie going up stairs). Update EKG - reassuring. Trial omeprazole 20mg  daily x 3 wks. If no better, I asked pt to let me know for cards eval for further risk stratification. She agrees with plan.  H/o reassuring ETT 2014.  HTN well controlled.  No h/o HLD or fmhx CAD.

## 2020-01-06 NOTE — Assessment & Plan Note (Signed)
Preventative protocols reviewed and updated unless pt declined. Discussed healthy diet and lifestyle.  

## 2020-01-06 NOTE — Assessment & Plan Note (Signed)
Continues compression stockings. Has seen Dr Donnetta Hutching [facility]

## 2020-01-06 NOTE — Assessment & Plan Note (Signed)
With known cervical spinal stenosis - wait and see.  Appreciate ortho care Louanne Skye).

## 2020-01-06 NOTE — Patient Instructions (Addendum)
EKG today - overall ok. Recommend trial of omeprazole 20mg  daily for 3 weeks. If chest pain not improved, let me know for referral to cardiology for further evaluation of ongoing chest discomfort.  Return as needed or in 1 year for next physical   Health Maintenance for Postmenopausal Women Menopause is a normal process in which your ability to get pregnant comes to an end. This process happens slowly over many months or years, usually between the ages of 74 and 79. Menopause is complete when you have missed your menstrual periods for 12 months. It is important to talk with your health care provider about some of the most common conditions that affect women after menopause (postmenopausal women). These include heart disease, cancer, and bone loss (osteoporosis). Adopting a healthy lifestyle and getting preventive care can help to promote your health and wellness. The actions you take can also lower your chances of developing some of these common conditions. What should I know about menopause? During menopause, you may get a number of symptoms, such as:  Hot flashes. These can be moderate or severe.  Night sweats.  Decrease in sex drive.  Mood swings.  Headaches.  Tiredness.  Irritability.  Memory problems.  Insomnia. Choosing to treat or not to treat these symptoms is a decision that you make with your health care provider. Do I need hormone replacement therapy?  Hormone replacement therapy is effective in treating symptoms that are caused by menopause, such as hot flashes and night sweats.  Hormone replacement carries certain risks, especially as you become older. If you are thinking about using estrogen or estrogen with progestin, discuss the benefits and risks with your health care provider. What is my risk for heart disease and stroke? The risk of heart disease, heart attack, and stroke increases as you age. One of the causes may be a change in the body's hormones during  menopause. This can affect how your body uses dietary fats, triglycerides, and cholesterol. Heart attack and stroke are medical emergencies. There are many things that you can do to help prevent heart disease and stroke. Watch your blood pressure  High blood pressure causes heart disease and increases the risk of stroke. This is more likely to develop in people who have high blood pressure readings, are of African descent, or are overweight.  Have your blood pressure checked: ? Every 3-5 years if you are 18-19 years of age. ? Every year if you are 43 years old or older. Eat a healthy diet   Eat a diet that includes plenty of vegetables, fruits, low-fat dairy products, and lean protein.  Do not eat a lot of foods that are high in solid fats, added sugars, or sodium. Get regular exercise Get regular exercise. This is one of the most important things you can do for your health. Most adults should:  Try to exercise for at least 150 minutes each week. The exercise should increase your heart rate and make you sweat (moderate-intensity exercise).  Try to do strengthening exercises at least twice each week. Do these in addition to the moderate-intensity exercise.  Spend less time sitting. Even light physical activity can be beneficial. Other tips  Work with your health care provider to achieve or maintain a healthy weight.  Do not use any products that contain nicotine or tobacco, such as cigarettes, e-cigarettes, and chewing tobacco. If you need help quitting, ask your health care provider.  Know your numbers. Ask your health care provider to check your cholesterol  and your blood sugar (glucose). Continue to have your blood tested as directed by your health care provider. Do I need screening for cancer? Depending on your health history and family history, you may need to have cancer screening at different stages of your life. This may include screening for:  Breast cancer.  Cervical  cancer.  Lung cancer.  Colorectal cancer. What is my risk for osteoporosis? After menopause, you may be at increased risk for osteoporosis. Osteoporosis is a condition in which bone destruction happens more quickly than new bone creation. To help prevent osteoporosis or the bone fractures that can happen because of osteoporosis, you may take the following actions:  If you are 36-12 years old, get at least 1,000 mg of calcium and at least 600 mg of vitamin D per day.  If you are older than age 41 but younger than age 109, get at least 1,200 mg of calcium and at least 600 mg of vitamin D per day.  If you are older than age 79, get at least 1,200 mg of calcium and at least 800 mg of vitamin D per day. Smoking and drinking excessive alcohol increase the risk of osteoporosis. Eat foods that are rich in calcium and vitamin D, and do weight-bearing exercises several times each week as directed by your health care provider. How does menopause affect my mental health? Depression may occur at any age, but it is more common as you become older. Common symptoms of depression include:  Low or sad mood.  Changes in sleep patterns.  Changes in appetite or eating patterns.  Feeling an overall lack of motivation or enjoyment of activities that you previously enjoyed.  Frequent crying spells. Talk with your health care provider if you think that you are experiencing depression. General instructions See your health care provider for regular wellness exams and vaccines. This may include:  Scheduling regular health, dental, and eye exams.  Getting and maintaining your vaccines. These include: ? Influenza vaccine. Get this vaccine each year before the flu season begins. ? Pneumonia vaccine. ? Shingles vaccine. ? Tetanus, diphtheria, and pertussis (Tdap) booster vaccine. Your health care provider may also recommend other immunizations. Tell your health care provider if you have ever been abused or do  not feel safe at home. Summary  Menopause is a normal process in which your ability to get pregnant comes to an end.  This condition causes hot flashes, night sweats, decreased interest in sex, mood swings, headaches, or lack of sleep.  Treatment for this condition may include hormone replacement therapy.  Take actions to keep yourself healthy, including exercising regularly, eating a healthy diet, watching your weight, and checking your blood pressure and blood sugar levels.  Get screened for cancer and depression. Make sure that you are up to date with all your vaccines. This information is not intended to replace advice given to you by your health care provider. Make sure you discuss any questions you have with your health care provider. Document Revised: 04/04/2018 Document Reviewed: 04/04/2018 Elsevier Patient Education  2020 [employer].

## 2020-01-06 NOTE — Progress Notes (Signed)
This visit was conducted in person.  BP 120/72 (BP Location: Left Arm, Patient Position: Sitting, Cuff Size: Large)   Pulse 64   Temp 97.9 F (36.6 C) (Temporal)   Ht 5\' 7"  (1.702 m)   Wt 206 lb 8 oz (93.7 kg)   SpO2 98%   BMI 32.34 kg/m    CC: CPE Subjective:    Patient ID: Carolyn [name] Labs, female    DOB: [birth year], 54 y.o.   MRN: [id number]  HPI: Carolyn [name] is a 54 y.o. female presenting on 01/06/2020 for Annual Exam   Stressed with parent's financial situation (scammed out of their life savings).  Ex husband just dx lung cancer.   GERD - takes duexis about 3 times a week for chronic jonit/neck pain.   Ongoing chest discomfort described as sudden tightness worse with going up stairs but can also be present when resting, happens about once every 2 wks, lasts minutes. Ongoing for the past 1+ years. No alleviating factors. She lays down and waits it out. Reassuring ETT 2014. Sometimes associated with dyspnea. Denies nausea, jaw or arm pain.    Preventative: COLONOSCOPY 12/2016-1 SSP, rpt 5 yrs - trouble tolerating prep (Nandigam) Well woman with [facility] [place] last 2016 - released from care. S/p hysterectomy, ovaries remain. No pelvic pain or vaginal bleeding.  Mammogram - Birads2 07/2019.  Lung cancer screening - not eligible  Flu shot - doesn't receive  Tdap -10/2016 COVID vaccine - [place] 06/2019 x2 zostavax 2017 shingrix - completed 2020 Seat belt use discussed Sunscreen use discussed. No changing moles on skin. Sees Dr [place] Crane. 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 Interim medical history since our last visit reviewed. Allergies and medications reviewed and updated. Outpatient Medications Prior to Visit  Medication Sig Dispense Refill  . diphenhydrAMINE (BENADRYL) 25 MG tablet Take 25 mg by mouth as needed.    . Ibuprofen-Famotidine 800-26.6 MG TABS Take 1 tablet by mouth 2 (two) times daily after a meal. (Patient taking differently: Take 1 tablet by mouth 2 (two) times daily after a meal. As needed) 180 tablet 3  . Psyllium (METAMUCIL FIBER PO) Take by mouth as needed.    [name] amLODipine (NORVASC) 10 MG tablet TAKE 1 TABLET BY MOUTH DAILY 90 tablet 3  . cyclobenzaprine (FLEXERIL) 10 MG tablet TAKE 1/2 TO 1 TABLET BY MOUTH TWICE DAILY AS NEEDED FOR MUSCLE SPASMS 30 tablet 3  . losartan-hydrochlorothiazide (HYZAAR) 100-12.5 MG tablet TAKE 1 TABLET BY MOUTH DAILY 90 tablet 3  . traMADol (ULTRAM) 50 MG tablet TAKE 1 TABLET BY MOUTH EVERY 8 HOURS AS NEEDED 20 tablet 0   No facility-administered medications prior to visit.     Per HPI unless specifically indicated in ROS section below Review of Systems  Constitutional: Negative for activity change, appetite change, chills, fatigue, fever and unexpected weight change.  HENT: Negative for hearing loss.   Eyes: Negative for visual disturbance.  Respiratory: Positive for chest tightness (see HPI). Negative for cough, shortness of breath and wheezing.   Cardiovascular: Positive for leg swelling. Negative for  chest pain and palpitations.  Gastrointestinal: Negative for abdominal distention, abdominal pain, blood in stool, constipation, diarrhea, nausea and vomiting.  Genitourinary: Negative for difficulty urinating and hematuria.  Musculoskeletal: Negative for arthralgias, myalgias and neck pain.  Skin: Negative for rash.  Neurological: Negative for dizziness, seizures, syncope and headaches.  Hematological: Negative for adenopathy. Does not bruise/bleed easily.    Psychiatric/Behavioral: Negative for dysphoric mood. The patient is not nervous/anxious.    Objective:  BP 120/72 (BP Location: Left Arm, Patient Position: Sitting, Cuff Size: Large)   Pulse 64   Temp 97.9 F (36.6 C) (Temporal)   Ht 5\' 7"  (1.702 m)   Wt 206 lb 8 oz (93.7 kg)   SpO2 98%   BMI 32.34 kg/m   Wt Readings from Last 3 Encounters:  01/06/20 206 lb 8 oz (93.7 kg)  10/04/19 197 lb (89.4 kg)  07/01/19 200 lb 3.2 oz (90.8 kg)      Physical Exam Vitals and nursing note reviewed.  Constitutional:      General: She is not in acute distress.    Appearance: Normal appearance. She is well-developed. She is not ill-appearing.  HENT:     Head: Normocephalic and atraumatic.     Right Ear: Hearing, tympanic membrane, ear canal and external ear normal.     Left Ear: Hearing, tympanic membrane, ear canal and external ear normal.  Eyes:     General: No scleral icterus.    Extraocular Movements: Extraocular movements intact.     Conjunctiva/sclera: Conjunctivae normal.     Pupils: Pupils are equal, round, and reactive to light.  Neck:     Thyroid: No thyroid mass or thyromegaly.  Cardiovascular:     Rate and Rhythm: Normal rate and regular rhythm.     Pulses: Normal pulses.          Radial pulses are 2+ on the right side and 2+ on the left side.     Heart sounds: Normal heart sounds. No murmur heard.   Pulmonary:     Effort: Pulmonary effort is normal. No respiratory distress.     Breath sounds: Normal breath sounds. No wheezing, rhonchi or rales.  Abdominal:     General: Abdomen is flat. Bowel sounds are normal. There is no distension.     Palpations: Abdomen is soft. There is no mass.     Tenderness: There is no abdominal tenderness. There is no guarding or rebound.     Hernia: No hernia is present.  Musculoskeletal:        General: Normal range of motion.     Cervical back: Normal range of motion and neck supple.     Right lower leg: No edema.     Left lower leg: No  edema.  Lymphadenopathy:     Cervical: No cervical adenopathy.  Skin:    General: Skin is warm and dry.     Findings: No rash.  Neurological:     General: No focal deficit present.     Mental Status: She is alert and oriented to person, place, and time.     Comments: CN grossly intact, station and gait intact  Psychiatric:        Mood and Affect: Mood normal.        Behavior: Behavior normal.        Thought Content: Thought content normal.        Judgment: Judgment normal.       Results for orders placed or performed in visit on  01/02/20  Hepatitis C antibody  Result Value Ref Range   Hepatitis C Ab NON-REACTIVE NON-REACTI   SIGNAL TO CUT-OFF 0.01 <3.14  Basic metabolic panel  Result Value Ref Range   Sodium 141 135 - 145 mEq/L   Potassium 3.7 3.5 - 5.1 mEq/L   Chloride 104 96 - 112 mEq/L   CO2 29 19 - 32 mEq/L   Glucose, Bld 99 70 - 99 mg/dL   BUN 18 6 - 23 mg/dL   Creatinine, Ser 0.79 0.40 - 1.20 mg/dL   GFR 75.85 >60.00 mL/min   Calcium 9.4 8.4 - 10.5 mg/dL   EKG - sinus bradycardia rate 50s, normal axis, intervals, no acute ST/T changes.  Assessment & Plan:  This visit occurred during the SARS-CoV-2 public health emergency.  Safety protocols were in place, including screening questions prior to the visit, additional usage of staff PPE, and extensive cleaning of exam room while observing appropriate contact time as indicated for disinfecting solutions.   Problem List Items Addressed This Visit      Chronic   Spinal stenosis in cervical region     Diagnosis of   DISH (diffuse idiopathic skeletal hyperostosis)    With known cervical spinal stenosis - wait and see.  Appreciate ortho care Louanne Skye).         Other   Obesity, Class I, BMI 30-34.9    Weight gain noted.       Hypertension    Chronic, stable. Continue current regimen.       Relevant Medications   amLODipine (NORVASC) 10 MG tablet   losartan-hydrochlorothiazide (HYZAAR) 100-12.5 MG tablet    Encounter for routine adult health examination with abnormal findings - Primary    Preventative protocols reviewed and updated unless pt declined. Discussed healthy diet and lifestyle.       Relevant Orders   EKG 12-Lead (Completed)   Chronic venous insufficiency    Continues compression stockings. Has seen Dr Donnetta Hutching [facility]      Relevant Medications   amLODipine (NORVASC) 10 MG tablet   losartan-hydrochlorothiazide (HYZAAR) 100-12.5 MG tablet   Chest pain    Ongoing chest discomfort with some atyipcal, some concerning features (tightness worse with exertion ie going up stairs). Update EKG - reassuring. Trial omeprazole 20mg  daily x 3 wks. If no better, I asked pt to let me know for cards eval for further risk stratification. She agrees with plan.  H/o reassuring ETT 2014.  HTN well controlled.  No h/o HLD or fmhx CAD.       Relevant Orders   EKG 12-Lead (Completed)       Meds ordered this encounter  Medications  . amLODipine (NORVASC) 10 MG tablet    Sig: Take 1 tablet (10 mg total) by mouth daily.    Dispense:  90 tablet    Refill:  3  . cyclobenzaprine (FLEXERIL) 10 MG tablet    Sig: TAKE 1/2 TO 1 TABLET BY MOUTH TWICE DAILY AS NEEDED FOR MUSCLE SPASMS    Dispense:  30 tablet    Refill:  3  . traMADol (ULTRAM) 50 MG tablet    Sig: Take 1 tablet (50 mg total) by mouth every 8 (eight) hours as needed.    Dispense:  20 tablet    Refill:  0  . losartan-hydrochlorothiazide (HYZAAR) 100-12.5 MG tablet    Sig: Take 1 tablet by mouth daily.    Dispense:  90 tablet    Refill:  3   Orders Placed This Encounter  Procedures  . EKG 12-Lead    Patient instructions: EKG today - overall ok. Recommend trial of omeprazole 20mg  daily for 3 weeks. If chest pain not improved, let me know for referral to cardiology for further evaluation of ongoing chest discomfort.  Return as needed or in 1 year for next physical   Follow up plan: Return in about 1 year (around 01/05/2021) for annual  exam, prior fasting for blood work.  Ria Bush, MD

## 2020-03-20 ENCOUNTER — Other Ambulatory Visit: Payer: Self-pay | Admitting: Family Medicine

## 2020-03-23 NOTE — Telephone Encounter (Signed)
Name of Medication: Tramadol Name of Pharmacy: [facility] or Written Date and Quantity: 05/01/19, #20 Last Office Visit and Type: 01/06/20, CPE Next Office Visit and Type: 01/06/21, CPE Last Controlled Substance Agreement Date: none Last UDS: none

## 2020-03-23 NOTE — Telephone Encounter (Signed)
ERx 

## 2020-05-31 ENCOUNTER — Other Ambulatory Visit: Payer: Self-pay | Admitting: Family Medicine

## 2020-06-01 NOTE — Telephone Encounter (Signed)
Pharmacy requests refill on: Amlodipine 10 mg   LAST REFILL: 01/06/2020 (Q-90, R-3) LAST OV: 01/06/2020 NEXT OV: 01/06/2021 PHARMACY: [id number] Linna Hoff, [place]

## 2020-07-13 ENCOUNTER — Other Ambulatory Visit (HOSPITAL_COMMUNITY): Payer: Self-pay | Admitting: Family Medicine

## 2020-07-13 DIAGNOSIS — Z1231 Encounter for screening mammogram for malignant neoplasm of breast: Secondary | ICD-10-CM

## 2020-07-15 ENCOUNTER — Ambulatory Visit: Payer: BC Managed Care – PPO | Admitting: Family Medicine

## 2020-07-15 ENCOUNTER — Other Ambulatory Visit: Payer: Self-pay

## 2020-07-15 ENCOUNTER — Encounter: Payer: Self-pay | Admitting: Family Medicine

## 2020-07-15 DIAGNOSIS — R21 Rash and other nonspecific skin eruption: Secondary | ICD-10-CM | POA: Diagnosis not present

## 2020-07-15 MED ORDER — HYDROXYZINE HCL 25 MG PO TABS: 25.0000 mg | ORAL_TABLET | Freq: Three times a day (TID) | ORAL | 0 refills | Status: DC | PRN

## 2020-07-15 MED ORDER — CLOBETASOL PROPIONATE 0.05 % EX CREA: 1.0000 "application " | TOPICAL_CREAM | Freq: Two times a day (BID) | CUTANEOUS | 0 refills | Status: AC

## 2020-07-15 MED ORDER — PREDNISONE 20 MG PO TABS: ORAL_TABLET | ORAL | 0 refills | Status: DC

## 2020-07-15 NOTE — Patient Instructions (Signed)
I think you have persistent itching from initial poison ivy dermatitis.  Treat with prednisone taper, hydroxyzine as needed for itch in place of benadryl (sedation precautions) and clobetasol steroid topical cream to affected areas, no more than 2 weeks at a time.  Let us know if not improving with this.

## 2020-07-15 NOTE — Progress Notes (Signed)
Patient ID: Carolyn [name], female    DOB: 1966-02-09, 55 y.o.   MRN: [id number]  This visit was conducted in person.  BP 134/70 (BP Location: Right Arm, Cuff Size: Large)   Pulse 74   Temp 97.7 F (36.5 C) (Temporal)   Ht 5\' 7"  (1.702 m)   Wt 208 lb (94.3 kg)   SpO2 94%   BMI 32.58 kg/m    CC: rash on elbow  Subjective:   HPI: Carolyn [name] is a 55 y.o. female presenting on 07/15/2020 for Rash (On right elbow)   Blistering rash to left ankle - thought she ran into tick nest while at work. Then had R posterior lower back pain.   [place] February [place] - got into poison ivy - treated with benadryl, creams including triamcinolone 0.1% without benefit. Rash is drying up but she notes continued itching at sites of rash. Can get bad poison ivy rash.   No fevers/chills, new joint pains, abd pain, nausea, oral lesions.  No new lotions, detergents, soaps or shampoos. No new medicines.   Recent amoxicillin for tooth infection (04/2020).      Relevant past medical, surgical, family and social history reviewed and updated as indicated. Interim medical history since our last visit reviewed. Allergies and medications reviewed and updated. Outpatient Medications Prior to Visit  Medication Sig Dispense Refill  . amLODipine (NORVASC) 10 MG tablet Take 1 tablet (10 mg total) by mouth daily. 90 tablet 3  . cyclobenzaprine (FLEXERIL) 10 MG tablet TAKE 1/2 TO 1 TABLET BY MOUTH TWICE DAILY AS NEEDED FOR MUSCLE SPASMS 30 tablet 3  . diphenhydrAMINE (BENADRYL) 25 MG tablet Take 25 mg by mouth as needed.    . Ibuprofen-Famotidine 800-26.6 MG TABS Take 1 tablet by mouth 2 (two) times daily after a meal. (Patient taking differently: Take 1 tablet by mouth 2 (two) times daily after a meal. As needed) 180 tablet 3  . losartan-hydrochlorothiazide (HYZAAR) 100-12.5 MG tablet Take 1 tablet by mouth daily. 90 tablet 3  . Psyllium (METAMUCIL FIBER PO) Take by mouth as needed.    . traMADol (ULTRAM)  50 MG tablet TAKE 1 TABLET BY MOUTH EVERY 8 HOURS AS NEEDED 20 tablet 0   No facility-administered medications prior to visit.     Per HPI unless specifically indicated in ROS section below Review of Systems Objective:  BP 134/70 (BP Location: Right Arm, Cuff Size: Large)   Pulse 74   Temp 97.7 F (36.5 C) (Temporal)   Ht 5\' 7"  (1.702 m)   Wt 208 lb (94.3 kg)   SpO2 94%   BMI 32.58 kg/m   Wt Readings from Last 3 Encounters:  07/15/20 208 lb (94.3 kg)  01/06/20 206 lb 8 oz (93.7 kg)  10/04/19 197 lb (89.4 kg)      Physical Exam Vitals and nursing note reviewed.  Constitutional:      Appearance: Normal appearance. She is not ill-appearing.  Skin:    General: Skin is warm and dry.     Findings: Rash present.     Comments:  Erythematous pruritic patch to R lower back  Rough rash to R lateral elbow at site of prior poison ivy with excoriations and developing lichenification of skin  Neurological:     Mental Status: She is alert.  Psychiatric:        Mood and Affect: Mood normal.        Behavior: Behavior normal.  Assessment & Plan:  This visit occurred during the SARS-CoV-2 public health emergency.  Safety protocols were in place, including screening questions prior to the visit, additional usage of staff PPE, and extensive cleaning of exam room while observing appropriate contact time as indicated for disinfecting solutions.   Problem List Items Addressed This Visit    Skin rash    Anticipate elbow rash initial poison ivy dermatitis that hasn't fully healed due to ongoing scratching. Rx steroid taper with oral steroid precautions, hydroxyzine PRN itch in place of benadryl, and high potency topical steroid clobetasol with topical steroid precautions. Update if not improving with treatment.           Meds ordered this encounter  Medications  . predniSONE (DELTASONE) 20 MG tablet    Sig: Take two tablets daily for 3 days followed by one tablet daily for 4 days     Dispense:  10 tablet    Refill:  0  . hydrOXYzine (ATARAX/VISTARIL) 25 MG tablet    Sig: Take 1 tablet (25 mg total) by mouth 3 (three) times daily as needed.    Dispense:  30 tablet    Refill:  0  . clobetasol cream (TEMOVATE) 0.05 %    Sig: Apply 1 application topically 2 (two) times daily. Apply to AA for no more than 2 weeks at a time    Dispense:  60 g    Refill:  0   No orders of the defined types were placed in this encounter.   Patient Instructions  I think you have persistent itching from initial poison ivy dermatitis.  Treat with prednisone taper, hydroxyzine as needed for itch in place of benadryl (sedation precautions) and clobetasol steroid topical cream to affected areas, no more than 2 weeks at a time.  Let Carolyn [name] know if not improving with this.    Follow up plan: Return if symptoms worsen or fail to improve.  Ria Bush, MD

## 2020-07-15 NOTE — Assessment & Plan Note (Signed)
Anticipate elbow rash initial poison ivy dermatitis that hasn't fully healed due to ongoing scratching. Rx steroid taper with oral steroid precautions, hydroxyzine PRN itch in place of benadryl, and high potency topical steroid clobetasol with topical steroid precautions. Update if not improving with treatment.

## 2020-08-19 ENCOUNTER — Ambulatory Visit (HOSPITAL_COMMUNITY)
Admission: RE | Admit: 2020-08-19 | Discharge: 2020-08-19 | Disposition: A | Discharge status: Home / Self Care | Payer: BC Managed Care – PPO | Source: Ambulatory Visit | Attending: Family Medicine | Admitting: Family Medicine

## 2020-08-19 ENCOUNTER — Other Ambulatory Visit: Payer: Self-pay

## 2020-08-19 DIAGNOSIS — Z1231 Encounter for screening mammogram for malignant neoplasm of breast: Secondary | ICD-10-CM | POA: Diagnosis present

## 2020-08-20 ENCOUNTER — Encounter: Payer: Self-pay | Admitting: Family Medicine

## 2020-08-20 MED ORDER — SCOPOLAMINE 1 MG/3DAYS TD PT72: 1.0000 | MEDICATED_PATCH | TRANSDERMAL | 1 refills | Status: DC

## 2020-11-21 ENCOUNTER — Other Ambulatory Visit: Payer: Self-pay | Admitting: Family Medicine

## 2020-11-23 NOTE — Telephone Encounter (Signed)
ERx 

## 2020-12-23 ENCOUNTER — Other Ambulatory Visit: Payer: Self-pay | Admitting: Family Medicine

## 2020-12-23 NOTE — Telephone Encounter (Signed)
Spoke with pt asking about rash and if she still needed hydroxyzine.  States she has a new rash and that's why she requested refill.  Pt scheduled OV with Dr. Glori Bickers on 9/2 at 9:30.  Due to her schedule, [facility] is the earliest she can be seen. FYI to Dr. Darnell Level.

## 2020-12-24 NOTE — Telephone Encounter (Signed)
ERx 

## 2020-12-25 ENCOUNTER — Ambulatory Visit: Payer: BC Managed Care – PPO | Admitting: Family Medicine

## 2020-12-25 ENCOUNTER — Other Ambulatory Visit: Payer: Self-pay

## 2020-12-25 ENCOUNTER — Encounter: Payer: Self-pay | Admitting: Family Medicine

## 2020-12-25 VITALS — BP 118/72 | HR 68 | Temp 98.0°F | Ht 67.0 in | Wt 213.1 lb

## 2020-12-25 DIAGNOSIS — R21 Rash and other nonspecific skin eruption: Secondary | ICD-10-CM

## 2020-12-25 MED ORDER — PREDNISONE 20 MG PO TABS: ORAL_TABLET | ORAL | 0 refills | Status: DC

## 2020-12-25 NOTE — Patient Instructions (Signed)
Keep rash clean and dry  Avoid anything with fragrance   Dove soap for sensitive skin  Free detergent  Lotion without scent    Take the prednisone as directed I placed a dermatology referral

## 2020-12-25 NOTE — Progress Notes (Signed)
Subjective:    Patient ID: Carolyn [name], female    DOB: 1965/06/22, 55 y.o.   MRN: [id number]  This visit occurred during the SARS-CoV-2 public health emergency.  Safety protocols were in place, including screening questions prior to the visit, additional usage of staff PPE, and extensive cleaning of exam room while observing appropriate contact time as indicated for disinfecting solutions.   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  Got covid when she was there   It calmed down and then flared on L  Under arms  Worse with sweat and heat   Using clobetasol cream -initially helped but not now   Trying hard not to scratch  She called to ask for more hydroxyzine (it helps some)  Does not sedate  Sees Dr Nevada Crane / Dr Jess Barters for dermatology  Is prone to poison ivy- earlier this summer had prednisone and cream  Patient Active Problem List   Diagnosis Date Noted   Skin rash 07/15/2020   Chronic low back pain without sciatica 12/23/2017   Encounter for routine adult health examination with abnormal findings 11/22/2016   Obesity, Class I, BMI 30-34.9 11/22/2016   GERD (gastroesophageal reflux disease) 09/08/2016   Chronic fatigue 09/08/2016   Urge incontinence    Family history of DVT 03/05/2015   Chronic venous insufficiency 03/05/2015   Esophageal tear 03/04/2015    Class: Acute   HNP (herniated nucleus pulposus) with myelopathy, cervical 03/03/2015    Class: Chronic   Spinal stenosis in cervical region 03/03/2015    Class: Chronic   S/P cervical spinal fusion 03/03/2015   Chest pain 12/18/2012   Hypertension    Heart murmur    Dry eyes    DISH (diffuse idiopathic skeletal hyperostosis) 08/31/2012     Class: Diagnosis of   Other dysphagia 08/31/2012    Class: Diagnosis of   Past Medical History:  Diagnosis Date   Anxiety    Arthritis    patient unsure   Depression    but doesn't require any meds   Diffuse idiopathic skeletal hyperostosis    Dry eyes    uses Restasis bid   Esophageal tear 03/04/2015   Left pyriformis sinus.    GERD (gastroesophageal reflux disease)    HCAP (healthcare-associated pneumonia) 03/05/2015   Headache(784.0)    MRI in 2007 bc of headaches   Heart murmur    New diagnosis   History of chicken pox    Hypertension    takes Hyzaar daily   Neck pain    bone spur on esophagus   PONV (postoperative nausea and vomiting) 2014   nausea and vomiting    Spinal stenosis in cervical region 03/03/2015   Urge incontinence    Past Surgical History:  Procedure Laterality Date   ABDOMINAL HYSTERECTOMY  2011   bleeding - ovaries remain   ANTERIOR CERVICAL DECOMP/DISCECTOMY FUSION N/A 08/21/2012   Procedure: Excision of exostosis C2-3,C3-4, C4-5;  Surgeon: Jessy Oto, MD;  Location: [facility];  Service: Orthopedics;  Laterality: N/A;   ANTERIOR CERVICAL DECOMP/DISCECTOMY FUSION N/A 03/03/2015   Procedure: ANTERIOR CERVICAL DISCECTOMY AND FUSION WITH  PARTIAL VERTEBRECTOMY C3 AND C4, CERVICAL PLATE AND SCREWS, ALLOGRAFT, LOCAL BONE GRAFT, VIVIGEN;  Surgeon: Jessy Oto, MD;  Location: [facility];  Service: Orthopedics;  Laterality: N/A;   COLONOSCOPY  12/2016   1 SSP, rpt 5 yrs - trouble tolerating prep (Nandigam)   DIRECT LARYNGOSCOPY  03/03/2015   Procedure: DIRECT LARYNGOSCOPY AND PLACEMENT OF FEEDING TUBE;  Surgeon: [name], MD;  Location: [facility];  Service: ENT;;   ESOPHAGOGASTRODUODENOSCOPY  12/2013   [facility] ([facility])   REPAIR OF ESOPHAGUS  03/03/2015   Procedure: REPAIR OF ESOPHAGUS;  Surgeon: [name], MD;  Location: [facility];  Service: ENT;;   Social History   Tobacco Use   Smoking status: Never   Smokeless tobacco: Never  Vaping Use   Vaping Use: Never  used  Substance Use Topics   Alcohol use: Yes    Comment: rarely   Drug use: No   Family History  Problem Relation Age of Onset   Heart murmur Father    Hypertension Father    Cancer Father        skin   Colon polyps Father    Hypertension Mother    Cancer Paternal Aunt        uterine   Cancer Maternal Grandmother        lung (non smoker)   Cancer Maternal Grandfather 64       stomach   Cancer Paternal Grandmother        lung (smoker)   Diabetes Neg Hx    CAD Neg Hx    Stroke Neg Hx    Allergies  Allergen Reactions   Ace Inhibitors Swelling   Current Outpatient Medications on File Prior to Visit  Medication Sig Dispense Refill   amLODipine (NORVASC) 10 MG tablet Take 1 tablet (10 mg total) by mouth daily. 90 tablet 3   clobetasol cream (TEMOVATE) [id number] % Apply 1 application topically 2 (two) times daily. Apply to AA for no more than 2 weeks at a time 60 g 0   cyclobenzaprine (FLEXERIL) 10 MG tablet TAKE 1/2 TO 1 TABLET BY MOUTH TWICE DAILY AS NEEDED FOR MUSCLE SPASMS 30 tablet 3   diphenhydrAMINE (BENADRYL) 25 MG tablet Take 25 mg by mouth as needed.     hydrOXYzine (ATARAX/VISTARIL) 25 MG tablet TAKE 1 TABLET(25 MG) BY MOUTH THREE TIMES DAILY AS NEEDED 30 tablet 0   Ibuprofen-Famotidine 800-26.6 MG TABS Take 1 tablet by mouth 2 (two) times daily after a meal. (Patient taking differently: Take 1 tablet by mouth 2 (two) times daily after a meal. As needed) 180 tablet 3   losartan-hydrochlorothiazide (HYZAAR) 100-12.5 MG tablet Take 1 tablet by mouth daily. 90 tablet 3   Psyllium (METAMUCIL FIBER PO) Take by mouth as needed.     scopolamine (TRANSDERM-SCOP, 1.5 MG,) 1 MG/3DAYS Place 1 patch (1.5 mg total) onto the skin every 3 (three) days. 4 patch 1   traMADol (ULTRAM) 50 MG tablet TAKE 1 TABLET(50 MG) BY MOUTH EVERY 8 HOURS AS NEEDED 20 tablet 0   No current facility-administered medications on file prior to visit.    Review of Systems  Constitutional:  Negative for  activity change, appetite change, fatigue, fever and unexpected weight change.  HENT:  Negative for congestion, ear pain, rhinorrhea, sinus pressure and sore throat.   Eyes:  Negative for pain, redness and visual disturbance.  Respiratory:  Negative for cough, shortness of breath and wheezing.   Cardiovascular:  Negative for chest pain and palpitations.  Gastrointestinal:  Negative for abdominal pain, blood in stool, constipation and diarrhea.  Endocrine: Negative for polydipsia and polyuria.  Genitourinary:  Negative for dysuria, frequency and urgency.  Musculoskeletal:  Negative for arthralgias, back pain and myalgias.  Skin:  Positive for rash. Negative for pallor and wound.  Allergic/Immunologic: Negative for environmental allergies.  Neurological:  Negative for dizziness, syncope and headaches.  Hematological:  Negative for adenopathy. Does not bruise/bleed easily.  Psychiatric/Behavioral:  Negative for decreased concentration and dysphoric mood. The patient is not nervous/anxious.       Objective:   Physical Exam Constitutional:      General: She is not in acute distress.    Appearance: Normal appearance. She is obese. She is not ill-appearing.  HENT:     Head: Normocephalic and atraumatic.     Right Ear: External ear normal.     Left Ear: External ear normal.  Eyes:     General:        Right eye: No discharge.        Left eye: No discharge.     Conjunctiva/sclera: Conjunctivae normal.     Pupils: Pupils are equal, round, and reactive to light.  Cardiovascular:     Rate and Rhythm: Normal rate and regular rhythm.  Pulmonary:     Effort: Pulmonary effort is normal.     Breath sounds: Normal breath sounds. No wheezing.  Musculoskeletal:     Cervical back: Normal range of motion and neck supple. No tenderness.  Lymphadenopathy:     Cervical: No cervical adenopathy.  Skin:    General: Skin is warm and dry.     Findings: Rash present.     Comments: Areas of dry skin and  scale on extremities, worst on L calf area with some excoriation and scabbing and pink color Some linear pattern noted/like plant dermatitis  Axillae and web spaces and groin are clear  No whelps or vesicles   Neurological:     Mental Status: She is alert.     Sensory: No sensory deficit.     Coordination: Coordination normal.  Psychiatric:        Mood and Affect: Mood normal.          Assessment & Plan:   Problem List Items Addressed This Visit       Musculoskeletal and Integument   Skin rash - Primary    This calmed down initially and then flared again  Worst area is L calf  Resembles contact or atopic dermatitis Using topical steroid Hydroxyzine helped some inst to keep clean with soap and water, use non scented products and to avoid heat/hot water  Prednisone taper px Ref made to dermatology      Relevant Orders   Ambulatory referral to Dermatology

## 2020-12-28 ENCOUNTER — Other Ambulatory Visit: Payer: Self-pay | Admitting: Family Medicine

## 2020-12-28 DIAGNOSIS — E669 Obesity, unspecified: Secondary | ICD-10-CM

## 2020-12-28 NOTE — Assessment & Plan Note (Signed)
This calmed down initially and then flared again  Worst area is L calf  Resembles contact or atopic dermatitis Using topical steroid Hydroxyzine helped some inst to keep clean with soap and water, use non scented products and to avoid heat/hot water  Prednisone taper px Ref made to dermatology

## 2020-12-30 ENCOUNTER — Other Ambulatory Visit: Payer: Self-pay

## 2020-12-30 ENCOUNTER — Other Ambulatory Visit (INDEPENDENT_AMBULATORY_CARE_PROVIDER_SITE_OTHER): Payer: BC Managed Care – PPO

## 2020-12-30 DIAGNOSIS — E669 Obesity, unspecified: Secondary | ICD-10-CM | POA: Diagnosis not present

## 2020-12-30 LAB — BASIC METABOLIC PANEL
BUN: 16 mg/dL (ref 6–23)
CO2: 31 mEq/L (ref 19–32)
Calcium: 9.3 mg/dL (ref 8.4–10.5)
Chloride: 101 mEq/L (ref 96–112)
Creatinine, Ser: 0.82 mg/dL (ref 0.40–1.20)
GFR: 80.78 mL/min (ref >60.00)
Glucose, Bld: 97 mg/dL (ref 70–99)
Potassium: 3.8 mEq/L (ref 3.5–5.1)
Sodium: 140 mEq/L (ref 135–145)

## 2021-01-06 ENCOUNTER — Other Ambulatory Visit: Payer: Self-pay

## 2021-01-06 ENCOUNTER — Other Ambulatory Visit: Payer: Self-pay | Admitting: Family Medicine

## 2021-01-06 ENCOUNTER — Encounter: Payer: Self-pay | Admitting: Family Medicine

## 2021-01-06 ENCOUNTER — Ambulatory Visit (INDEPENDENT_AMBULATORY_CARE_PROVIDER_SITE_OTHER): Payer: BC Managed Care – PPO | Admitting: Family Medicine

## 2021-01-06 VITALS — BP 122/74 | HR 69 | Temp 97.5°F | Ht 67.25 in | Wt 211.1 lb

## 2021-01-06 DIAGNOSIS — E669 Obesity, unspecified: Secondary | ICD-10-CM

## 2021-01-06 DIAGNOSIS — R5383 Other fatigue: Secondary | ICD-10-CM

## 2021-01-06 DIAGNOSIS — Z0001 Encounter for general adult medical examination with abnormal findings: Secondary | ICD-10-CM

## 2021-01-06 DIAGNOSIS — I10 Essential (primary) hypertension: Secondary | ICD-10-CM | POA: Diagnosis not present

## 2021-01-06 DIAGNOSIS — I872 Venous insufficiency (chronic) (peripheral): Secondary | ICD-10-CM

## 2021-01-06 DIAGNOSIS — E559 Vitamin D deficiency, unspecified: Secondary | ICD-10-CM | POA: Insufficient documentation

## 2021-01-06 DIAGNOSIS — M4802 Spinal stenosis, cervical region: Secondary | ICD-10-CM

## 2021-01-06 DIAGNOSIS — K219 Gastro-esophageal reflux disease without esophagitis: Secondary | ICD-10-CM

## 2021-01-06 DIAGNOSIS — Z981 Arthrodesis status: Secondary | ICD-10-CM

## 2021-01-06 DIAGNOSIS — M4810 Ankylosing hyperostosis [Forestier], site unspecified: Secondary | ICD-10-CM | POA: Diagnosis not present

## 2021-01-06 LAB — CBC WITH DIFFERENTIAL/PLATELET
Basophils Absolute: 0.1 10*3/uL (ref 0.0–0.1)
Basophils Relative: 0.8 % (ref 0.0–3.0)
Eosinophils Absolute: 0.5 10*3/uL (ref 0.0–0.7)
Eosinophils Relative: 6.2 % — ABNORMAL HIGH (ref 0.0–5.0)
HCT: 39.9 % (ref 36.0–46.0)
Hemoglobin: 13.6 g/dL (ref 12.0–15.0)
Lymphocytes Relative: 13.9 % (ref 12.0–46.0)
Lymphs Abs: 1.1 10*3/uL (ref 0.7–4.0)
MCHC: 34.2 g/dL (ref 30.0–36.0)
MCV: 84.8 fl (ref 78.0–100.0)
Monocytes Absolute: 0.8 10*3/uL (ref 0.1–1.0)
Monocytes Relative: 10.1 % (ref 3.0–12.0)
Neutro Abs: 5.6 10*3/uL (ref 1.4–7.7)
Neutrophils Relative %: 69 % (ref 43.0–77.0)
Platelets: 286 10*3/uL (ref 150.0–400.0)
RBC: 4.71 Mil/uL (ref 3.87–5.11)
RDW: 14.2 % (ref 11.5–15.5)
WBC: 8.2 10*3/uL (ref 4.0–10.5)

## 2021-01-06 LAB — TSH: TSH: 0.79 u[IU]/mL (ref 0.35–5.50)

## 2021-01-06 LAB — HEPATIC FUNCTION PANEL
ALT: 18 U/L (ref 0–35)
AST: 15 U/L (ref 0–37)
Albumin: 4.3 g/dL (ref 3.5–5.2)
Alkaline Phosphatase: 78 U/L (ref 39–117)
Bilirubin, Direct: 0.2 mg/dL (ref 0.0–0.3)
Total Bilirubin: 0.8 mg/dL (ref 0.2–1.2)
Total Protein: 7.2 g/dL (ref 6.0–8.3)

## 2021-01-06 LAB — VITAMIN D 25 HYDROXY (VIT D DEFICIENCY, FRACTURES): VITD: 14.13 ng/mL — ABNORMAL LOW (ref 30.00–100.00)

## 2021-01-06 LAB — VITAMIN B12: Vitamin B-12: 467 pg/mL (ref 211–911)

## 2021-01-06 MED ORDER — VITAMIN D3 25 MCG (1000 UT) PO CAPS: 1.0000 | ORAL_CAPSULE | Freq: Every day | ORAL | Status: DC

## 2021-01-06 MED ORDER — OMEPRAZOLE 40 MG PO CPDR: 40.0000 mg | DELAYED_RELEASE_CAPSULE | Freq: Every day | ORAL | 3 refills | Status: DC

## 2021-01-06 MED ORDER — CYCLOBENZAPRINE HCL 10 MG PO TABS: ORAL_TABLET | ORAL | 3 refills | Status: DC

## 2021-01-06 MED ORDER — TRAMADOL HCL 50 MG PO TABS: 50.0000 mg | ORAL_TABLET | Freq: Two times a day (BID) | ORAL | 0 refills | Status: DC | PRN

## 2021-01-06 MED ORDER — SCOPOLAMINE 1 MG/3DAYS TD PT72: 1.0000 | MEDICATED_PATCH | TRANSDERMAL | 1 refills | Status: DC

## 2021-01-06 MED ORDER — HYDROXYZINE HCL 25 MG PO TABS: ORAL_TABLET | ORAL | 3 refills | Status: DC

## 2021-01-06 MED ORDER — LOSARTAN POTASSIUM-HCTZ 100-12.5 MG PO TABS: 1.0000 | ORAL_TABLET | Freq: Every day | ORAL | 3 refills | Status: DC

## 2021-01-06 MED ORDER — VITAMIN D3 1.25 MG (50000 UT) PO TABS: 1.0000 | ORAL_TABLET | ORAL | 0 refills | Status: AC

## 2021-01-06 MED ORDER — AMLODIPINE BESYLATE 10 MG PO TABS: 10.0000 mg | ORAL_TABLET | Freq: Every day | ORAL | 3 refills | Status: DC

## 2021-01-06 NOTE — Assessment & Plan Note (Signed)
Sees ortho Louanne Skye), managed with PRN NSAID although this is limited by GI upset.

## 2021-01-06 NOTE — Assessment & Plan Note (Signed)
Chronic, duexis unaffordable.  Will try omeprazole '40mg'$  daily for 3 wks then PRN, update with effect.

## 2021-01-06 NOTE — Assessment & Plan Note (Signed)
Continue compression stocking use. Discussed leg elevation, avoiding sodium in diet - she does regularly eat canned soups.

## 2021-01-06 NOTE — Patient Instructions (Addendum)
Trial omeprazole '40mg'$  daily for reflux symptoms Watch sodium /salt in the diet, increase water, continue leg elevation and compression stocking use.  Scopolamine patch sent in for your upcoming trip.  Return as needed or in 1 year for next physical.   Health Maintenance for Postmenopausal Women Menopause is a normal process in which your ability to get pregnant comes to an end. This process happens slowly over many months or years, usually between the ages of 25 and 62. Menopause is complete when you have missed your menstrual periods for 12 months. It is important to talk with your health care provider about some of the most common conditions that affect women after menopause (postmenopausal women). These include heart disease, cancer, and bone loss (osteoporosis). Adopting a healthy lifestyle and getting preventive care can help to promote your health and wellness. The actions you take can also lower your chances of developing some of these common conditions. What should I know about menopause? During menopause, you may get a number of symptoms, such as: Hot flashes. These can be moderate or severe. Night sweats. Decrease in sex drive. Mood swings. Headaches. Tiredness. Irritability. Memory problems. Insomnia. Choosing to treat or not to treat these symptoms is a decision that you make with your health care provider. Do I need hormone replacement therapy? Hormone replacement therapy is effective in treating symptoms that are caused by menopause, such as hot flashes and night sweats. Hormone replacement carries certain risks, especially as you become older. If you are thinking about using estrogen or estrogen with progestin, discuss the benefits and risks with your health care provider. What is my risk for heart disease and stroke? The risk of heart disease, heart attack, and stroke increases as you age. One of the causes may be a change in the body's hormones during menopause. This can  affect how your body uses dietary fats, triglycerides, and cholesterol. Heart attack and stroke are medical emergencies. There are many things that you can do to help prevent heart disease and stroke. Watch your blood pressure High blood pressure causes heart disease and increases the risk of stroke. This is more likely to develop in people who have high blood pressure readings, are of African descent, or are overweight. Have your blood pressure checked: Every 3-5 years if you are 56-36 years of age. Every year if you are 48 years old or older. Eat a healthy diet  Eat a diet that includes plenty of vegetables, fruits, low-fat dairy products, and lean protein. Do not eat a lot of foods that are high in solid fats, added sugars, or sodium. Get regular exercise Get regular exercise. This is one of the most important things you can do for your health. Most adults should: Try to exercise for at least 150 minutes each week. The exercise should increase your heart rate and make you sweat (moderate-intensity exercise). Try to do strengthening exercises at least twice each week. Do these in addition to the moderate-intensity exercise. Spend less time sitting. Even light physical activity can be beneficial. Other tips Work with your health care provider to achieve or maintain a healthy weight. Do not use any products that contain nicotine or tobacco, such as cigarettes, e-cigarettes, and chewing tobacco. If you need help quitting, ask your health care provider. Know your numbers. Ask your health care provider to check your cholesterol and your blood sugar (glucose). Continue to have your blood tested as directed by your health care provider. Do I need screening for cancer? Depending  on your health history and family history, you may need to have cancer screening at different stages of your life. This may include screening for: Breast cancer. Cervical cancer. Lung cancer. Colorectal cancer. What is my  risk for osteoporosis? After menopause, you may be at increased risk for osteoporosis. Osteoporosis is a condition in which bone destruction happens more quickly than new bone creation. To help prevent osteoporosis or the bone fractures that can happen because of osteoporosis, you may take the following actions: If you are 45-75 years old, get at least 1,000 mg of calcium and at least 600 mg of vitamin D per day. If you are older than age 77 but younger than age 27, get at least 1,200 mg of calcium and at least 600 mg of vitamin D per day. If you are older than age 50, get at least 1,200 mg of calcium and at least 800 mg of vitamin D per day. Smoking and drinking excessive alcohol increase the risk of osteoporosis. Eat foods that are rich in calcium and vitamin D, and do weight-bearing exercises several times each week as directed by your health care provider. How does menopause affect my mental health? Depression may occur at any age, but it is more common as you become older. Common symptoms of depression include: Low or sad mood. Changes in sleep patterns. Changes in appetite or eating patterns. Feeling an overall lack of motivation or enjoyment of activities that you previously enjoyed. Frequent crying spells. Talk with your health care provider if you think that you are experiencing depression. General instructions See your health care provider for regular wellness exams and vaccines. This may include: Scheduling regular health, dental, and eye exams. Getting and maintaining your vaccines. These include: Influenza vaccine. Get this vaccine each year before the flu season begins. Pneumonia vaccine. Shingles vaccine. Tetanus, diphtheria, and pertussis (Tdap) booster vaccine. Your health care provider may also recommend other immunizations. Tell your health care provider if you have ever been abused or do not feel safe at home. Summary Menopause is a normal process in which your ability to  get pregnant comes to an end. This condition causes hot flashes, night sweats, decreased interest in sex, mood swings, headaches, or lack of sleep. Treatment for this condition may include hormone replacement therapy. Take actions to keep yourself healthy, including exercising regularly, eating a healthy diet, watching your weight, and checking your blood pressure and blood sugar levels. Get screened for cancer and depression. Make sure that you are up to date with all your vaccines. This information is not intended to replace advice given to you by your health care provider. Make sure you discuss any questions you have with your health care provider. Document Revised: 04/04/2018 Document Reviewed: 04/04/2018 Elsevier Patient Education  2022 [employer].

## 2021-01-06 NOTE — Progress Notes (Signed)
Patient ID: Carolyn [name], female    DOB: 08-03-1965, 55 y.o.   MRN: [id number]  This visit was conducted in person.  BP 122/74   Pulse 69   Temp (!) 97.5 F (36.4 C) (Temporal)   Ht 5' 7.25" (1.708 m)   Wt 211 lb 1 oz (95.7 kg)   SpO2 98%   BMI 32.81 kg/m    CC: CPE Subjective:   HPI: Carolyn [name] is a 55 y.o. female presenting on 01/06/2021 for Annual Exam    Upcoming trip to [place] next month - requests scopolamine patches for motion sickness.   Ongoing fatigue for several months. Attributes to heat. No night sweats or unexpected weight changes, heat or cold intolerance.   Skin rash - comes and goes - last seen 2 wks ago in office for L calf rash ?contact or atopic dermatitis managing with topical steroid, prednisone orally, referred to dermatology - pending.   Also notes increasing leg swelling. Normally uses compression stocking except when she has rash.   GERD - Duexis works well but not covered by Insurance underwriter. Managing with diet. Stopped NSAID due to significant GI upset.   DISH - planning to return to see Dr Louanne Skye.   COVID end of 10/2020, fully recovered but notes residual hair loss - discussed likely telogen effluvium.   Preventative: COLONOSCOPY 12/2016 - 1 SSP, rpt 5 yrs - trouble tolerating prep (Nandigam) Well woman with OBGYN Dr Derenda Mis in [place] last 2016 - released from care. S/p hysterectomy for heavy bleeding, ovaries remain. No pelvic pain or vaginal bleeding.  Mammogram - Birads1 @ [facility] 07/2020.  Lung cancer screening - not eligible  Flu shot - doesn't receive  Tdap - 10/2016 COVID vaccine - [facility] 06/2019 x2, booster 03/2020.  zostavax 2017 shingrix - 12/2018, 03/2019  Seat belt use discussed.  Sunscreen use discussed. No changing moles on skin. 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 Interim medical history since our last visit reviewed. Allergies and medications reviewed and updated. Outpatient Medications Prior to Visit  Medication Sig Dispense Refill   clobetasol cream (TEMOVATE) [id number] % Apply 1 application topically 2 (two) times daily. Apply to AA for no more than 2 weeks at a time 60 g 0   diphenhydrAMINE (BENADRYL) 25 MG tablet Take 25 mg by mouth as needed.     Ibuprofen-Famotidine 800-26.6 MG TABS Take 1 tablet by mouth 2 (two) times daily after a meal. (Patient taking differently: Take 1 tablet by mouth 2 (two) times daily after a meal. As needed) 180 tablet 3   Psyllium (METAMUCIL FIBER PO) Take by mouth as needed.     amLODipine (NORVASC) 10 MG tablet Take 1 tablet (10 mg total) by mouth daily. 90 tablet 3   cyclobenzaprine (FLEXERIL) 10 MG tablet TAKE 1/2 TO 1 TABLET BY MOUTH TWICE DAILY AS NEEDED FOR MUSCLE SPASMS 30 tablet 3   hydrOXYzine (ATARAX/VISTARIL)  25 MG tablet TAKE 1 TABLET(25 MG) BY MOUTH THREE TIMES DAILY AS NEEDED 30 tablet 0   losartan-hydrochlorothiazide (HYZAAR) 100-12.5 MG tablet Take 1 tablet by mouth daily. 90 tablet 3   scopolamine (TRANSDERM-SCOP, 1.5 MG,) 1 MG/3DAYS Place 1 patch (1.5 mg total) onto the skin every 3 (three) days. 4 patch 1   traMADol (ULTRAM) 50 MG tablet TAKE 1 TABLET(50 MG) BY MOUTH EVERY 8 HOURS AS NEEDED 20 tablet 0   predniSONE (DELTASONE) 20 MG tablet Take two tablets daily for 3 days followed by one tablet daily for 4 days 10 tablet 0   No facility-administered medications prior to visit.     Per HPI unless specifically indicated in ROS section below Review of  Systems  Constitutional:  Negative for activity change, appetite change, chills, fatigue, fever and unexpected weight change.  HENT:  Negative for hearing loss.   Eyes:  Negative for visual disturbance.  Respiratory:  Negative for cough, chest tightness, shortness of breath and wheezing.   Cardiovascular:  Positive for leg swelling. Negative for chest pain and palpitations.  Gastrointestinal:  Negative for abdominal distention, abdominal pain, blood in stool, constipation, diarrhea, nausea and vomiting.  Genitourinary:  Negative for difficulty urinating and hematuria.  Musculoskeletal:  Positive for arthralgias and back pain. Negative for myalgias and neck pain.  Skin:  Negative for rash.  Neurological:  Negative for dizziness, seizures, syncope and headaches.  Hematological:  Negative for adenopathy. Does not bruise/bleed easily.  Psychiatric/Behavioral:  Negative for dysphoric mood. The patient is not nervous/anxious.    Objective:  BP 122/74   Pulse 69   Temp (!) 97.5 F (36.4 C) (Temporal)   Ht 5' 7.25" (1.708 m)   Wt 211 lb 1 oz (95.7 kg)   SpO2 98%   BMI 32.81 kg/m   Wt Readings from Last 3 Encounters:  01/06/21 211 lb 1 oz (95.7 kg)  12/25/20 213 lb 2 oz (96.7 kg)  07/15/20 208 lb (94.3 kg)      Physical Exam Vitals and nursing note reviewed.  Constitutional:      Appearance: Normal appearance. She is not ill-appearing.  Eyes:     Extraocular Movements: Extraocular movements intact.     Conjunctiva/sclera: Conjunctivae normal.     Pupils: Pupils are equal, round, and reactive to light.  Cardiovascular:     Rate and Rhythm: Normal rate and regular rhythm.     Pulses: Normal pulses.     Heart sounds: Normal heart sounds. No murmur heard. Pulmonary:     Effort: Pulmonary effort is normal. No respiratory distress.     Breath sounds: Normal breath sounds. No wheezing or rhonchi.  Musculoskeletal:     Cervical back: Normal range of motion and neck supple. No rigidity.      Right lower leg: No edema.     Left lower leg: No edema.  Lymphadenopathy:     Cervical: No cervical adenopathy.  Skin:    General: Skin is warm and dry.     Findings: No rash.  Neurological:     Mental Status: She is alert.  Psychiatric:        Mood and Affect: Mood normal.        Behavior: Behavior normal.      Results for orders placed or performed in visit on 01/06/21  Vitamin B12  Result Value Ref Range   Vitamin B-12 467 211 - 911 pg/mL  VITAMIN D 25 Hydroxy (Vit-D Deficiency, Fractures)  Result Value Ref Range  VITD 14.13 (L) 30.00 - 100.00 ng/mL  TSH  Result Value Ref Range   TSH 0.79 0.35 - 5.50 uIU/mL  CBC with Differential/Platelet  Result Value Ref Range   WBC 8.2 4.0 - 10.5 K/uL   RBC 4.71 3.87 - 5.11 Mil/uL   Hemoglobin 13.6 12.0 - 15.0 g/dL   HCT 39.9 36.0 - 46.0 %   MCV 84.8 78.0 - 100.0 fl   MCHC 34.2 30.0 - 36.0 g/dL   RDW 14.2 11.5 - 15.5 %   Platelets 286.0 150.0 - 400.0 K/uL   Neutrophils Relative % 69.0 43.0 - 77.0 %   Lymphocytes Relative 13.9 12.0 - 46.0 %   Monocytes Relative 10.1 3.0 - 12.0 %   Eosinophils Relative 6.2 (H) 0.0 - 5.0 %   Basophils Relative 0.8 0.0 - 3.0 %   Neutro Abs 5.6 1.4 - 7.7 K/uL   Lymphs Abs 1.1 0.7 - 4.0 K/uL   Monocytes Absolute 0.8 0.1 - 1.0 K/uL   Eosinophils Absolute 0.5 0.0 - 0.7 K/uL   Basophils Absolute 0.1 0.0 - 0.1 K/uL  Hepatic function panel  Result Value Ref Range   Total Bilirubin 0.8 0.2 - 1.2 mg/dL   Bilirubin, Direct 0.2 0.0 - 0.3 mg/dL   Alkaline Phosphatase 78 39 - 117 U/L   AST 15 0 - 37 U/L   ALT 18 0 - 35 U/L   Total Protein 7.2 6.0 - 8.3 g/dL   Albumin 4.3 3.5 - 5.2 g/dL    Assessment & Plan:  This visit occurred during the SARS-CoV-2 public health emergency.  Safety protocols were in place, including screening questions prior to the visit, additional usage of staff PPE, and extensive cleaning of exam room while observing appropriate contact time as indicated for disinfecting  solutions.   Problem List Items Addressed This Visit       Chronic   Spinal stenosis in cervical region     Diagnosis of   DISH (diffuse idiopathic skeletal hyperostosis)    Sees ortho (Nitka), managed with PRN NSAID although this is limited by GI upset.         Other   Encounter for routine adult health examination with abnormal findings - Primary (Chronic)    Preventative protocols reviewed and updated unless pt declined. Discussed healthy diet and lifestyle.       Hypertension    Chronic, stable. Continue current regimen.       Relevant Medications   amLODipine (NORVASC) 10 MG tablet   losartan-hydrochlorothiazide (HYZAAR) 100-12.5 MG tablet   S/P cervical spinal fusion   Chronic venous insufficiency    Continue compression stocking use. Discussed leg elevation, avoiding sodium in diet - she does regularly eat canned soups.       Relevant Medications   amLODipine (NORVASC) 10 MG tablet   losartan-hydrochlorothiazide (HYZAAR) 100-12.5 MG tablet   GERD (gastroesophageal reflux disease)    Chronic, duexis unaffordable.  Will try omeprazole '40mg'$  daily for 3 wks then PRN, update with effect.       Relevant Medications   scopolamine (TRANSDERM-SCOP, 1.5 MG,) 1 MG/3DAYS   omeprazole (PRILOSEC) 40 MG capsule   Fatigue    Check labs for reversible causes of fatigue.      Relevant Orders   Vitamin B12 (Completed)   VITAMIN D 25 Hydroxy (Vit-D Deficiency, Fractures) (Completed)   TSH (Completed)   CBC with Differential/Platelet (Completed)   Hepatic function panel (Completed)   Obesity, Class I, BMI 30-34.9    Encouraged  healthy diet and lifestyle choices to affect sustainable weight loss.         Meds ordered this encounter  Medications   amLODipine (NORVASC) 10 MG tablet    Sig: Take 1 tablet (10 mg total) by mouth daily.    Dispense:  90 tablet    Refill:  3   losartan-hydrochlorothiazide (HYZAAR) 100-12.5 MG tablet    Sig: Take 1 tablet by mouth daily.     Dispense:  90 tablet    Refill:  3   traMADol (ULTRAM) 50 MG tablet    Sig: Take 1 tablet (50 mg total) by mouth 2 (two) times daily as needed for moderate pain.    Dispense:  20 tablet    Refill:  0   cyclobenzaprine (FLEXERIL) 10 MG tablet    Sig: TAKE 1/2 TO 1 TABLET BY MOUTH TWICE DAILY AS NEEDED FOR MUSCLE SPASMS    Dispense:  30 tablet    Refill:  3   scopolamine (TRANSDERM-SCOP, 1.5 MG,) 1 MG/3DAYS    Sig: Place 1 patch (1.5 mg total) onto the skin every 3 (three) days.    Dispense:  4 patch    Refill:  1   hydrOXYzine (ATARAX/VISTARIL) 25 MG tablet    Sig: TAKE 1 TABLET(25 MG) BY MOUTH THREE TIMES DAILY AS NEEDED    Dispense:  30 tablet    Refill:  3   omeprazole (PRILOSEC) 40 MG capsule    Sig: Take 1 capsule (40 mg total) by mouth daily. For 3 weeks then as needed    Dispense:  30 capsule    Refill:  3    Orders Placed This Encounter  Procedures   Vitamin B12   VITAMIN D 25 Hydroxy (Vit-D Deficiency, Fractures)   TSH   CBC with Differential/Platelet   Hepatic function panel     Patient instructions: Trial omeprazole '40mg'$  daily for reflux symptoms Watch sodium /salt in the diet, increase water, continue leg elevation and compression stocking use.  Scopolamine patch sent in for your upcoming trip.  Return as needed or in 1 year for next physical.   Follow up plan: Return in about 1 year (around 01/06/2022) for annual exam, prior fasting for blood work.  Ria Bush, MD

## 2021-01-06 NOTE — Assessment & Plan Note (Signed)
Check labs for reversible causes of fatigue.

## 2021-01-06 NOTE — Assessment & Plan Note (Signed)
Preventative protocols reviewed and updated unless pt declined. Discussed healthy diet and lifestyle.  

## 2021-01-06 NOTE — Assessment & Plan Note (Signed)
Encouraged healthy diet and lifestyle choices to affect sustainable weight loss.  ?

## 2021-01-06 NOTE — Assessment & Plan Note (Signed)
Chronic, stable. Continue current regimen. 

## 2021-01-31 ENCOUNTER — Other Ambulatory Visit: Payer: Self-pay | Admitting: Family Medicine

## 2021-02-03 ENCOUNTER — Other Ambulatory Visit (HOSPITAL_COMMUNITY)
Admission: RE | Admit: 2021-02-03 | Discharge: 2021-02-03 | Disposition: A | Discharge status: Home / Self Care | Payer: BC Managed Care – PPO | Source: Ambulatory Visit | Attending: Dermatology | Admitting: Dermatology

## 2021-02-03 DIAGNOSIS — L508 Other urticaria: Secondary | ICD-10-CM | POA: Insufficient documentation

## 2021-02-04 ENCOUNTER — Encounter: Payer: Self-pay | Admitting: Family Medicine

## 2021-02-04 LAB — ALPHA GALACTOSIDASE

## 2021-02-22 ENCOUNTER — Ambulatory Visit: Payer: BC Managed Care – PPO | Admitting: Family Medicine

## 2021-05-05 ENCOUNTER — Encounter: Payer: Self-pay | Admitting: Family Medicine

## 2021-05-05 MED ORDER — VITAMIN D3 1.25 MG (50000 UT) PO TABS: 1.0000 | ORAL_TABLET | ORAL | 1 refills | Status: DC

## 2021-10-31 ENCOUNTER — Other Ambulatory Visit: Payer: Self-pay | Admitting: Family Medicine

## 2021-11-09 ENCOUNTER — Other Ambulatory Visit: Payer: Self-pay | Admitting: Family Medicine

## 2021-11-18 ENCOUNTER — Ambulatory Visit: Payer: BC Managed Care – PPO | Admitting: Family Medicine

## 2021-11-18 ENCOUNTER — Encounter: Payer: Self-pay | Admitting: Family Medicine

## 2021-11-18 VITALS — BP 138/70 | HR 85 | Temp 97.7°F | Wt 216.5 lb

## 2021-11-18 DIAGNOSIS — R600 Localized edema: Secondary | ICD-10-CM

## 2021-11-18 DIAGNOSIS — R350 Frequency of micturition: Secondary | ICD-10-CM

## 2021-11-18 DIAGNOSIS — M545 Low back pain, unspecified: Secondary | ICD-10-CM | POA: Diagnosis not present

## 2021-11-18 LAB — CBC WITH DIFFERENTIAL/PLATELET
Basophils Absolute: 0.1 10*3/uL (ref 0.0–0.1)
Basophils Relative: 1.2 % (ref 0.0–3.0)
Eosinophils Absolute: 0.5 10*3/uL (ref 0.0–0.7)
Eosinophils Relative: 6.6 % — ABNORMAL HIGH (ref 0.0–5.0)
HCT: 33.8 % — ABNORMAL LOW (ref 36.0–46.0)
Hemoglobin: 11.3 g/dL — ABNORMAL LOW (ref 12.0–15.0)
Lymphocytes Relative: 16.6 % (ref 12.0–46.0)
Lymphs Abs: 1.2 10*3/uL (ref 0.7–4.0)
MCHC: 33.5 g/dL (ref 30.0–36.0)
MCV: 79.3 fl (ref 78.0–100.0)
Monocytes Absolute: 0.7 10*3/uL (ref 0.1–1.0)
Monocytes Relative: 9.7 % (ref 3.0–12.0)
Neutro Abs: 4.6 10*3/uL (ref 1.4–7.7)
Neutrophils Relative %: 65.9 % (ref 43.0–77.0)
Platelets: 276 10*3/uL (ref 150.0–400.0)
RBC: 4.26 Mil/uL (ref 3.87–5.11)
RDW: 14.7 % (ref 11.5–15.5)
WBC: 6.9 10*3/uL (ref 4.0–10.5)

## 2021-11-18 LAB — COMPREHENSIVE METABOLIC PANEL
ALT: 15 U/L (ref 0–35)
AST: 16 U/L (ref 0–37)
Albumin: 4.3 g/dL (ref 3.5–5.2)
Alkaline Phosphatase: 77 U/L (ref 39–117)
BUN: 15 mg/dL (ref 6–23)
CO2: 29 mEq/L (ref 19–32)
Calcium: 9.7 mg/dL (ref 8.4–10.5)
Chloride: 101 mEq/L (ref 96–112)
Creatinine, Ser: 0.78 mg/dL (ref 0.40–1.20)
GFR: 85.24 mL/min (ref >60.00)
Glucose, Bld: 102 mg/dL — ABNORMAL HIGH (ref 70–99)
Potassium: 3.8 mEq/L (ref 3.5–5.1)
Sodium: 138 mEq/L (ref 135–145)
Total Bilirubin: 0.4 mg/dL (ref 0.2–1.2)
Total Protein: 7.3 g/dL (ref 6.0–8.3)

## 2021-11-18 LAB — POC URINALSYSI DIPSTICK (AUTOMATED)
Bilirubin, UA: NEGATIVE
Blood, UA: NEGATIVE
Glucose, UA: NEGATIVE
Ketones, UA: NEGATIVE
Leukocytes, UA: NEGATIVE
Nitrite, UA: NEGATIVE
Protein, UA: NEGATIVE
Spec Grav, UA: 1.01 (ref 1.010–1.025)
Urobilinogen, UA: 0.2 E.U./dL
pH, UA: 8 (ref 5.0–8.0)

## 2021-11-18 LAB — BRAIN NATRIURETIC PEPTIDE: Pro B Natriuretic peptide (BNP): 58 pg/mL (ref 0.0–100.0)

## 2021-11-18 NOTE — Progress Notes (Signed)
Subjective:     Carolyn [name] is a 56 y.o. female presenting for Urinary Frequency (Only at night. X 1 month ), Back Pain (Right lumbar, only at night. Urination helps relieve pain. X 1 month ), and Leg Swelling (In both lower legs during the day. )     Urinary Frequency  Associated symptoms include frequency and urgency. Pertinent negatives include no hematuria.  Back Pain This is a new problem. The current episode started 1 to 4 weeks ago. Episode frequency: nighttime. The pain is present in the lumbar spine. The pain does not radiate. The pain is severe. The pain is Worse during the night. The symptoms are aggravated by lying down. Pertinent negatives include no bladder incontinence, bowel incontinence, dysuria, leg pain, numbness, tingling, weakness or weight loss. She has tried muscle relaxant and NSAIDs (tramadol and tylenol) for the symptoms. The treatment provided no relief.       Review of Systems  Constitutional:  Negative for weight loss.  Respiratory:  Negative for cough and shortness of breath.   Cardiovascular:  Positive for leg swelling.  Gastrointestinal:  Negative for bowel incontinence.  Genitourinary:  Positive for frequency and urgency. Negative for bladder incontinence, difficulty urinating, dysuria and hematuria.  Musculoskeletal:  Positive for back pain.  Neurological:  Negative for tingling, weakness and numbness.     Social History   Tobacco Use  Smoking Status Never  Smokeless Tobacco Never        Objective:    BP Readings from Last 3 Encounters:  11/18/21 138/70  01/06/21 122/74  12/25/20 118/72   Wt Readings from Last 3 Encounters:  11/18/21 216 lb 8 oz (98.2 kg)  01/06/21 211 lb 1 oz (95.7 kg)  12/25/20 213 lb 2 oz (96.7 kg)    BP 138/70   Pulse 85   Temp 97.7 F (36.5 C) (Temporal)   Wt 216 lb 8 oz (98.2 kg)   SpO2 98%   BMI 33.66 kg/m    Physical Exam Constitutional:      General: She is not in acute distress.     Appearance: She is well-developed. She is not diaphoretic.  HENT:     Right Ear: External ear normal.     Left Ear: External ear normal.     Nose: Nose normal.  Eyes:     Conjunctiva/sclera: Conjunctivae normal.  Cardiovascular:     Rate and Rhythm: Normal rate and regular rhythm.  Pulmonary:     Effort: Pulmonary effort is normal. No respiratory distress.     Breath sounds: Normal breath sounds. No wheezing.  Abdominal:     General: Abdomen is flat. There is no distension.     Palpations: Abdomen is soft.     Tenderness: There is no abdominal tenderness. There is no right CVA tenderness, left CVA tenderness, guarding or rebound.  Musculoskeletal:     Cervical back: Neck supple.     Right lower leg: Edema (1+ b/l) present.     Left lower leg: Edema present.     Comments: Back Inspection: no abnormalities Palpation: Lumbar spinous process ttp, no Paraspinous ttp ROM: Restricted flexion, extension, rotation, and lateral flexion Strength: Normal bilateral hip flexion/extension, knee flexion/extension, dorsiflexion/extension Straight leg raise - negative b/l    Skin:    General: Skin is warm and dry.     Capillary Refill: Capillary refill takes less than 2 seconds.  Neurological:     Mental Status: She is alert. Mental status is at baseline.  Psychiatric:        Mood and Affect: Mood normal.        Behavior: Behavior normal.           Assessment & Plan:   Problem List Items Addressed This Visit       Other   Acute right-sided low back pain without sciatica - Primary    Patient with a diagnosis of DISH for which she sees Ortho.  Unclear if improvement with urination overnight is due to position change as patient notes worse with laying down.  Or if pain is secondary to a bladder issue.  He lacks any CVA tenderness and her UA was bland today.  Did still send for culture due to urinary frequency.  Also check labs for kidney disease though this does seem less likely.   Suspect this is mostly a muscle related issue, if work-up is negative patient plans to see her orthopedic provider as she declined PT      Lower extremity edema    Patient with a history of vascular disease and was previously given prescription strength compression socks, but this was several years ago and she thinks they have worn out.  She is having urinary frequency overnight which may be secondary to her worsening swelling.  Check kidneys livers and heart to evaluate for possible explanation for worsening swelling.  Advised that patient get new compression socks.      Relevant Orders   Comprehensive metabolic panel   CBC with Differential   Brain natriuretic peptide   Other Visit Diagnoses     Urinary frequency       Relevant Orders   POCT Urinalysis Dipstick (Automated) (Completed)   Urine Culture        Return if symptoms worsen or fail to improve.  Lesleigh Noe, MD

## 2021-11-18 NOTE — Assessment & Plan Note (Signed)
Patient with a diagnosis of DISH for which she sees Ortho.  Unclear if improvement with urination overnight is due to position change as patient notes worse with laying down.  Or if pain is secondary to a bladder issue.  He lacks any CVA tenderness and her UA was bland today.  Did still send for culture due to urinary frequency.  Also check labs for kidney disease though this does seem less likely.  Suspect this is mostly a muscle related issue, if work-up is negative patient plans to see her orthopedic provider as she declined PT

## 2021-11-18 NOTE — Patient Instructions (Addendum)
Blood and urine culture today  If normal - see your ortho   Send mychart message to Dr. Darnell Level about the compression socks

## 2021-11-18 NOTE — Assessment & Plan Note (Signed)
Patient with a history of vascular disease and was previously given prescription strength compression socks, but this was several years ago and she thinks they have worn out.  She is having urinary frequency overnight which may be secondary to her worsening swelling.  Check kidneys livers and heart to evaluate for possible explanation for worsening swelling.  Advised that patient get new compression socks.

## 2021-11-19 LAB — URINE CULTURE
MICRO NUMBER:: 13703025
SPECIMEN QUALITY:: ADEQUATE

## 2021-12-27 ENCOUNTER — Other Ambulatory Visit: Payer: Self-pay | Admitting: Family Medicine

## 2021-12-27 DIAGNOSIS — I10 Essential (primary) hypertension: Secondary | ICD-10-CM

## 2021-12-27 DIAGNOSIS — E559 Vitamin D deficiency, unspecified: Secondary | ICD-10-CM

## 2021-12-27 DIAGNOSIS — K219 Gastro-esophageal reflux disease without esophagitis: Secondary | ICD-10-CM

## 2021-12-27 DIAGNOSIS — Z13220 Encounter for screening for lipoid disorders: Secondary | ICD-10-CM

## 2021-12-30 ENCOUNTER — Other Ambulatory Visit (INDEPENDENT_AMBULATORY_CARE_PROVIDER_SITE_OTHER): Payer: BC Managed Care – PPO

## 2021-12-30 DIAGNOSIS — I10 Essential (primary) hypertension: Secondary | ICD-10-CM | POA: Diagnosis not present

## 2021-12-30 DIAGNOSIS — Z13220 Encounter for screening for lipoid disorders: Secondary | ICD-10-CM | POA: Diagnosis not present

## 2021-12-30 DIAGNOSIS — K219 Gastro-esophageal reflux disease without esophagitis: Secondary | ICD-10-CM | POA: Diagnosis not present

## 2021-12-30 DIAGNOSIS — E559 Vitamin D deficiency, unspecified: Secondary | ICD-10-CM

## 2021-12-30 LAB — CBC WITH DIFFERENTIAL/PLATELET
Basophils Absolute: 0.1 10*3/uL (ref 0.0–0.1)
Basophils Relative: 1 % (ref 0.0–3.0)
Eosinophils Absolute: 0.3 10*3/uL (ref 0.0–0.7)
Eosinophils Relative: 6.3 % — ABNORMAL HIGH (ref 0.0–5.0)
HCT: 35.3 % — ABNORMAL LOW (ref 36.0–46.0)
Hemoglobin: 11.9 g/dL — ABNORMAL LOW (ref 12.0–15.0)
Lymphocytes Relative: 15.3 % (ref 12.0–46.0)
Lymphs Abs: 0.8 10*3/uL (ref 0.7–4.0)
MCHC: 33.8 g/dL (ref 30.0–36.0)
MCV: 78.5 fl (ref 78.0–100.0)
Monocytes Absolute: 0.5 10*3/uL (ref 0.1–1.0)
Monocytes Relative: 9.8 % (ref 3.0–12.0)
Neutro Abs: 3.3 10*3/uL (ref 1.4–7.7)
Neutrophils Relative %: 67.6 % (ref 43.0–77.0)
Platelets: 263 10*3/uL (ref 150.0–400.0)
RBC: 4.5 Mil/uL (ref 3.87–5.11)
RDW: 15.5 % (ref 11.5–15.5)
WBC: 4.9 10*3/uL (ref 4.0–10.5)

## 2021-12-30 LAB — LIPID PANEL
Cholesterol: 97 mg/dL (ref 0–200)
HDL: 36.2 mg/dL — ABNORMAL LOW (ref >39.00)
LDL Cholesterol: 48 mg/dL (ref 0–99)
NonHDL: 60.35
Total CHOL/HDL Ratio: 3
Triglycerides: 64 mg/dL (ref 0.0–149.0)
VLDL: 12.8 mg/dL (ref 0.0–40.0)

## 2021-12-30 LAB — VITAMIN D 25 HYDROXY (VIT D DEFICIENCY, FRACTURES): VITD: 75.49 ng/mL (ref 30.00–100.00)

## 2022-01-11 ENCOUNTER — Ambulatory Visit (INDEPENDENT_AMBULATORY_CARE_PROVIDER_SITE_OTHER): Payer: BC Managed Care – PPO | Admitting: Family Medicine

## 2022-01-11 ENCOUNTER — Encounter: Payer: Self-pay | Admitting: Family Medicine

## 2022-01-11 VITALS — BP 136/84 | HR 69 | Temp 97.5°F | Ht 66.5 in | Wt 191.1 lb

## 2022-01-11 DIAGNOSIS — Z0001 Encounter for general adult medical examination with abnormal findings: Secondary | ICD-10-CM | POA: Diagnosis not present

## 2022-01-11 DIAGNOSIS — R21 Rash and other nonspecific skin eruption: Secondary | ICD-10-CM

## 2022-01-11 DIAGNOSIS — I10 Essential (primary) hypertension: Secondary | ICD-10-CM | POA: Diagnosis not present

## 2022-01-11 DIAGNOSIS — K219 Gastro-esophageal reflux disease without esophagitis: Secondary | ICD-10-CM

## 2022-01-11 DIAGNOSIS — E669 Obesity, unspecified: Secondary | ICD-10-CM

## 2022-01-11 DIAGNOSIS — E66811 Obesity, class 1: Secondary | ICD-10-CM

## 2022-01-11 DIAGNOSIS — R600 Localized edema: Secondary | ICD-10-CM

## 2022-01-11 DIAGNOSIS — E559 Vitamin D deficiency, unspecified: Secondary | ICD-10-CM

## 2022-01-11 DIAGNOSIS — M4810 Ankylosing hyperostosis [Forestier], site unspecified: Secondary | ICD-10-CM

## 2022-01-11 MED ORDER — SCOPOLAMINE 1 MG/3DAYS TD PT72: 1.0000 | MEDICATED_PATCH | TRANSDERMAL | 1 refills | Status: DC

## 2022-01-11 NOTE — Progress Notes (Unsigned)
Patient ID: Carolyn [name], female    DOB: 01/31/1966, 56 y.o.   MRN: [id number]  This visit was conducted in person.  BP 136/84   Pulse 69   Temp (!) 97.5 F (36.4 C) (Temporal)   Ht 5' 6.5" (1.689 m)   Wt 191 lb 2 oz (86.7 kg)   SpO2 98%   BMI 30.39 kg/m    CC: CPE Subjective:   HPI: Carolyn [name] is a 56 y.o. female presenting on 01/11/2022 for Annual Exam   New grandson - with facial asymmetry and feeding difficulties.   Requests scopolamine patch refilled for upcoming trip to [place] and [place] next year. Enjoyed [place] trip last year.   Skin rash - saw dermatology last year Rozann Lesches), dx poison ivy + eczema treated with steroid creams.   GERD - Duexis works well but not covered by Insurance underwriter. Managing with diet. Stopped NSAID due to significant GI upset.   Seeing bariatric clinic in [place] and following low carb diet and lipotropic complex drops TID. Has lost 23 lbs since starting last month! Ankle swelling has resolved.   DISH - planning to return to see Dr Louanne Skye.   Preventative: COLONOSCOPY 12/2016 - 1 SSP, rpt 5 yrs - trouble tolerating prep (Nandigam)  Well woman with OBGYN Dr Derenda Mis in [place] last 2016 - released from care. S/p hysterectomy 2011 for heavy bleeding, ovaries remain. No pelvic pain.  Mammogram - Bir[facility] @ [facility] 07/2020. will call to schedule.  Lung cancer screening - not eligible  Flu shot - declines Tdap - 10/2016  COVID vaccine - [facility] 06/2019 x2, booster 03/2020.  zostavax 2017 shingrix - 12/2018, 03/2019  Seat belt use discussed.  Sunscreen use discussed. No changing moles on skin. 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 Interim medical history since our last visit reviewed. Allergies and medications reviewed and updated. Outpatient Medications Prior to Visit  Medication Sig Dispense Refill   diphenhydrAMINE (BENADRYL) 25 MG tablet Take 25 mg by mouth as needed.     Nutritional Supplements (LIPOTROPIC COMPLEX PO) Take by mouth in the morning, at noon, and at bedtime.     Psyllium (METAMUCIL FIBER PO) Take by mouth as needed.     amLODipine (NORVASC) 10 MG tablet Take 1 tablet (10 mg total) by mouth daily. 90 tablet 3   Cholecalciferol (VITAMIN D3) 1.25 MG (50000 UT) CAPS TAKE 1 CAPSULE BY MOUTH ONCE WEEKLY 12 capsule 0   cyclobenzaprine (FLEXERIL) 10 MG tablet TAKE 1/2 TO 1 TABLET BY MOUTH TWICE DAILY AS NEEDED FOR MUSCLE SPASMS 30 tablet 3   hydrOXYzine (ATARAX/VISTARIL) 25 MG tablet TAKE 1 TABLET(25 MG) BY MOUTH THREE TIMES DAILY AS NEEDED 30 tablet 3   losartan-hydrochlorothiazide (HYZAAR) 100-12.5 MG tablet Take 1 tablet by mouth daily. 90 tablet 3   omeprazole (PRILOSEC) 40 MG capsule Take 1 capsule (40 mg total) by mouth daily as needed. 30 capsule 1   scopolamine (TRANSDERM-SCOP, 1.5  MG,) 1 MG/3DAYS Place 1 patch (1.5 mg total) onto the skin every 3 (three) days. 4 patch 1   traMADol (ULTRAM) 50 MG tablet Take 1 tablet (50 mg total) by mouth 2 (two) times daily as needed for moderate pain. 20 tablet 0   Ibuprofen-Famotidine 800-26.6 MG TABS Take 1 tablet by mouth 2 (two) times daily after a meal. (Patient taking differently: Take 1 tablet by mouth 2 (two) times daily after a meal. As needed) 180 tablet 3   No facility-administered medications prior to visit.     Per HPI unless specifically indicated in ROS section below Review of Systems  Constitutional:  Negative for activity change, appetite change, chills, fatigue, fever  and unexpected weight change.  HENT:  Negative for hearing loss.   Eyes:  Negative for visual disturbance.  Respiratory:  Negative for cough, chest tightness, shortness of breath and wheezing.   Cardiovascular:  Negative for chest pain, palpitations and leg swelling.  Gastrointestinal:  Negative for abdominal distention, abdominal pain, blood in stool, constipation, diarrhea, nausea and vomiting.  Genitourinary:  Negative for difficulty urinating and hematuria.  Musculoskeletal:  Negative for arthralgias, myalgias and neck pain.  Skin:  Negative for rash.  Neurological:  Negative for dizziness, seizures, syncope and headaches.  Hematological:  Negative for adenopathy. Does not bruise/bleed easily.  Psychiatric/Behavioral:  Positive for dysphoric mood. The patient is nervous/anxious.     Objective:  BP 136/84   Pulse 69   Temp (!) 97.5 F (36.4 C) (Temporal)   Ht 5' 6.5" (1.689 m)   Wt 191 lb 2 oz (86.7 kg)   SpO2 98%   BMI 30.39 kg/m   Wt Readings from Last 3 Encounters:  01/11/22 191 lb 2 oz (86.7 kg)  11/18/21 216 lb 8 oz (98.2 kg)  01/06/21 211 lb 1 oz (95.7 kg)      Physical Exam Vitals and nursing note reviewed.  Constitutional:      Appearance: Normal appearance. She is not ill-appearing.  HENT:     Head: Normocephalic and atraumatic.     Right Ear: Tympanic membrane, ear canal and external ear normal. There is no impacted cerumen.     Left Ear: Tympanic membrane, ear canal and external ear normal. There is no impacted cerumen.  Eyes:     General:        Right eye: No discharge.        Left eye: No discharge.     Extraocular Movements: Extraocular movements intact.     Conjunctiva/sclera: Conjunctivae normal.     Pupils: Pupils are equal, round, and reactive to light.  Neck:     Thyroid: No thyroid mass or thyromegaly.  Cardiovascular:     Rate and Rhythm: Normal rate and regular rhythm.     Pulses: Normal pulses.     Heart sounds: Normal heart sounds. No  murmur heard. Pulmonary:     Effort: Pulmonary effort is normal. No respiratory distress.     Breath sounds: Normal breath sounds. No wheezing, rhonchi or rales.  Abdominal:     General: Bowel sounds are normal. There is no distension.     Palpations: Abdomen is soft. There is no mass.     Tenderness: There is no abdominal tenderness. There is no guarding or rebound.     Hernia: No hernia is present.  Musculoskeletal:     Cervical back: Normal range of motion and neck supple. No rigidity.     Right lower leg: No edema.  Left lower leg: No edema.  Lymphadenopathy:     Cervical: No cervical adenopathy.  Skin:    General: Skin is warm and dry.     Findings: No rash.  Neurological:     General: No focal deficit present.     Mental Status: She is alert. Mental status is at baseline.  Psychiatric:        Mood and Affect: Mood normal.        Behavior: Behavior normal.       Results for orders placed or performed in visit on 12/30/21  CBC with Differential/Platelet  Result Value Ref Range   WBC 4.9 4.0 - 10.5 K/uL   RBC 4.50 3.87 - 5.11 Mil/uL   Hemoglobin 11.9 (L) 12.0 - 15.0 g/dL   HCT 35.3 (L) 36.0 - 46.0 %   MCV 78.5 78.0 - 100.0 fl   MCHC 33.8 30.0 - 36.0 g/dL   RDW 15.5 11.5 - 15.5 %   Platelets 263.0 150.0 - 400.0 K/uL   Neutrophils Relative % 67.6 43.0 - 77.0 %   Lymphocytes Relative 15.3 12.0 - 46.0 %   Monocytes Relative 9.8 3.0 - 12.0 %   Eosinophils Relative 6.3 (H) 0.0 - 5.0 %   Basophils Relative 1.0 0.0 - 3.0 %   Neutro Abs 3.3 1.4 - 7.7 K/uL   Lymphs Abs 0.8 0.7 - 4.0 K/uL   Monocytes Absolute 0.5 0.1 - 1.0 K/uL   Eosinophils Absolute 0.3 0.0 - 0.7 K/uL   Basophils Absolute 0.1 0.0 - 0.1 K/uL  Lipid panel  Result Value Ref Range   Cholesterol 97 0 - 200 mg/dL   Triglycerides 64.0 0.0 - 149.0 mg/dL   HDL 36.20 (L) >39.00 mg/dL   VLDL 12.8 0.0 - 40.0 mg/dL   LDL Cholesterol 48 0 - 99 mg/dL   Total CHOL/HDL Ratio 3    NonHDL 60.35   VITAMIN D 25  Hydroxy (Vit-D Deficiency, Fractures)  Result Value Ref Range   VITD 75.49 30.00 - 100.00 ng/mL    Assessment & Plan:   Problem List Items Addressed This Visit       Diagnosis of   DISH (diffuse idiopathic skeletal hyperostosis)    Chronic, stable period. Has seen ortho Louanne Skye). Continue PRN tramadol, flexeril        Other   Encounter for routine adult health examination with abnormal findings - Primary (Chronic)    Preventative protocols reviewed and updated unless pt declined. Discussed healthy diet and lifestyle.       Hypertension    Chronic, stable on current regimen - continue.       Relevant Medications   amLODipine (NORVASC) 10 MG tablet   losartan-hydrochlorothiazide (HYZAAR) 100-12.5 MG tablet   GERD (gastroesophageal reflux disease)    Managed with PRN PPI and diet modifications      Relevant Medications   scopolamine (TRANSDERM-SCOP, 1.5 MG,) 1 MG/3DAYS   omeprazole (PRILOSEC) 40 MG capsule   Obesity, Class I, BMI 30-34.9    Congratulated on weight loss to date, encouraged ongoing efforts.  Just started seeing bariatric clinic in [place], taking new weight loss supplement.       Skin rash    Saw dermatology, diagnosed with eczema + poison ivy dermatitis.       Vitamin D deficiency    Recent levels stable - continue weekly vitamin D replacement.       Lower extremity edema    This has improved with weight loss.  Meds ordered this encounter  Medications   scopolamine (TRANSDERM-SCOP, 1.5 MG,) 1 MG/3DAYS    Sig: Place 1 patch (1.5 mg total) onto the skin every 3 (three) days.    Dispense:  10 patch    Refill:  1   amLODipine (NORVASC) 10 MG tablet    Sig: Take 1 tablet (10 mg total) by mouth daily.    Dispense:  90 tablet    Refill:  3   cyclobenzaprine (FLEXERIL) 10 MG tablet    Sig: TAKE 1/2 TO 1 TABLET BY MOUTH TWICE DAILY AS NEEDED FOR MUSCLE SPASMS    Dispense:  30 tablet    Refill:  3   hydrOXYzine (ATARAX) 25 MG tablet     Sig: TAKE 1 TABLET(25 MG) BY MOUTH THREE TIMES DAILY AS NEEDED    Dispense:  30 tablet    Refill:  3   losartan-hydrochlorothiazide (HYZAAR) 100-12.5 MG tablet    Sig: Take 1 tablet by mouth daily.    Dispense:  90 tablet    Refill:  3   omeprazole (PRILOSEC) 40 MG capsule    Sig: Take 1 capsule (40 mg total) by mouth daily as needed.    Dispense:  30 capsule    Refill:  3   traMADol (ULTRAM) 50 MG tablet    Sig: Take 1 tablet (50 mg total) by mouth 2 (two) times daily as needed for moderate pain.    Dispense:  20 tablet    Refill:  0   Cholecalciferol (VITAMIN D3) 1.25 MG (50000 UT) CAPS    Sig: Take 1 capsule by mouth once a week.    Dispense:  12 capsule    Refill:  3   No orders of the defined types were placed in this encounter.   Patient instructions: Happy belated birthday!  Scopolamine patch refilled.  You may call [facility] to schedule repeat colonoscopy at [phone].   Call and schedule mammogram at your convenience.  Good to see you today Return as needed or in 1 year for next physical.   Follow up plan: Return in about 1 year (around 01/12/2023) for annual exam, prior fasting for blood work.  Ria Bush, MD

## 2022-01-11 NOTE — Patient Instructions (Addendum)
Happy belated birthday!  Scopolamine patch refilled.  You may call [facility] to schedule repeat colonoscopy at [phone].   Call and schedule mammogram at your convenience.  Good to see you today  Return as needed or in 1 year for next physical.   Health Maintenance for Postmenopausal Women Menopause is a normal process in which your ability to get pregnant comes to an end. This process happens slowly over many months or years, usually between the ages of 58 and 78. Menopause is complete when you have missed your menstrual period for 12 months. It is important to talk with your health care provider about some of the most common conditions that affect women after menopause (postmenopausal women). These include heart disease, cancer, and bone loss (osteoporosis). Adopting a healthy lifestyle and getting preventive care can help to promote your health and wellness. The actions you take can also lower your chances of developing some of these common conditions. What are the signs and symptoms of menopause? During menopause, you may have the following symptoms: Hot flashes. These can be moderate or severe. Night sweats. Decrease in sex drive. Mood swings. Headaches. Tiredness (fatigue). Irritability. Memory problems. Problems falling asleep or staying asleep. Talk with your health care provider about treatment options for your symptoms. Do I need hormone replacement therapy? Hormone replacement therapy is effective in treating symptoms that are caused by menopause, such as hot flashes and night sweats. Hormone replacement carries certain risks, especially as you become older. If you are thinking about using estrogen or estrogen with progestin, discuss the benefits and risks with your health care provider. How can I reduce my risk for heart disease and stroke? The risk of heart disease, heart attack, and stroke increases as you age. One of the causes may be a change in the body's hormones  during menopause. This can affect how your body uses dietary fats, triglycerides, and cholesterol. Heart attack and stroke are medical emergencies. There are many things that you can do to help prevent heart disease and stroke. Watch your blood pressure High blood pressure causes heart disease and increases the risk of stroke. This is more likely to develop in people who have high blood pressure readings or are overweight. Have your blood pressure checked: Every 3-5 years if you are 37-18 years of age. Every year if you are 28 years old or older. Eat a healthy diet  Eat a diet that includes plenty of vegetables, fruits, low-fat dairy products, and lean protein. Do not eat a lot of foods that are high in solid fats, added sugars, or sodium. Get regular exercise Get regular exercise. This is one of the most important things you can do for your health. Most adults should: Try to exercise for at least 150 minutes each week. The exercise should increase your heart rate and make you sweat (moderate-intensity exercise). Try to do strengthening exercises at least twice each week. Do these in addition to the moderate-intensity exercise. Spend less time sitting. Even light physical activity can be beneficial. Other tips Work with your health care provider to achieve or maintain a healthy weight. Do not use any products that contain nicotine or tobacco. These products include cigarettes, chewing tobacco, and vaping devices, such as e-cigarettes. If you need help quitting, ask your health care provider. Know your numbers. Ask your health care provider to check your cholesterol and your blood sugar (glucose). Continue to have your blood tested as directed by your health care provider. Do I need  screening for cancer? Depending on your health history and family history, you may need to have cancer screenings at different stages of your life. This may include screening for: Breast cancer. Cervical  cancer. Lung cancer. Colorectal cancer. What is my risk for osteoporosis? After menopause, you may be at increased risk for osteoporosis. Osteoporosis is a condition in which bone destruction happens more quickly than new bone creation. To help prevent osteoporosis or the bone fractures that can happen because of osteoporosis, you may take the following actions: If you are 23-78 years old, get at least 1,000 mg of calcium and at least 600 international units (IU) of vitamin D per day. If you are older than age 65 but younger than age 51, get at least 1,200 mg of calcium and at least 600 international units (IU) of vitamin D per day. If you are older than age 55, get at least 1,200 mg of calcium and at least 800 international units (IU) of vitamin D per day. Smoking and drinking excessive alcohol increase the risk of osteoporosis. Eat foods that are rich in calcium and vitamin D, and do weight-bearing exercises several times each week as directed by your health care provider. How does menopause affect my mental health? Depression may occur at any age, but it is more common as you become older. Common symptoms of depression include: Feeling depressed. Changes in sleep patterns. Changes in appetite or eating patterns. Feeling an overall lack of motivation or enjoyment of activities that you previously enjoyed. Frequent crying spells. Talk with your health care provider if you think that you are experiencing any of these symptoms. General instructions See your health care provider for regular wellness exams and vaccines. This may include: Scheduling regular health, dental, and eye exams. Getting and maintaining your vaccines. These include: Influenza vaccine. Get this vaccine each year before the flu season begins. Pneumonia vaccine. Shingles vaccine. Tetanus, diphtheria, and pertussis (Tdap) booster vaccine. Your health care provider may also recommend other immunizations. Tell your health  care provider if you have ever been abused or do not feel safe at home. Summary Menopause is a normal process in which your ability to get pregnant comes to an end. This condition causes hot flashes, night sweats, decreased interest in sex, mood swings, headaches, or lack of sleep. Treatment for this condition may include hormone replacement therapy. Take actions to keep yourself healthy, including exercising regularly, eating a healthy diet, watching your weight, and checking your blood pressure and blood sugar levels. Get screened for cancer and depression. Make sure that you are up to date with all your vaccines. This information is not intended to replace advice given to you by your health care provider. Make sure you discuss any questions you have with your health care provider. Document Revised: 08/31/2020 Document Reviewed: 08/31/2020 Elsevier Patient Education  [place].

## 2022-01-12 MED ORDER — OMEPRAZOLE 40 MG PO CPDR: 40.0000 mg | DELAYED_RELEASE_CAPSULE | Freq: Every day | ORAL | 3 refills | Status: DC | PRN

## 2022-01-12 MED ORDER — HYDROXYZINE HCL 25 MG PO TABS: ORAL_TABLET | ORAL | 3 refills | Status: DC

## 2022-01-12 MED ORDER — TRAMADOL HCL 50 MG PO TABS: 50.0000 mg | ORAL_TABLET | Freq: Two times a day (BID) | ORAL | 0 refills | Status: DC | PRN

## 2022-01-12 MED ORDER — CYCLOBENZAPRINE HCL 10 MG PO TABS: ORAL_TABLET | ORAL | 3 refills | Status: DC

## 2022-01-12 MED ORDER — VITAMIN D3 1.25 MG (50000 UT) PO CAPS: 1.0000 | ORAL_CAPSULE | ORAL | 3 refills | Status: DC

## 2022-01-12 MED ORDER — AMLODIPINE BESYLATE 10 MG PO TABS: 10.0000 mg | ORAL_TABLET | Freq: Every day | ORAL | 3 refills | Status: DC

## 2022-01-12 MED ORDER — LOSARTAN POTASSIUM-HCTZ 100-12.5 MG PO TABS: 1.0000 | ORAL_TABLET | Freq: Every day | ORAL | 3 refills | Status: DC

## 2022-01-12 NOTE — Assessment & Plan Note (Signed)
This has improved with weight loss.

## 2022-01-12 NOTE — Assessment & Plan Note (Signed)
Chronic, stable on current regimen - continue. 

## 2022-01-12 NOTE — Assessment & Plan Note (Signed)
Recent levels stable - continue weekly vitamin D replacement.

## 2022-01-12 NOTE — Assessment & Plan Note (Signed)
Saw dermatology, diagnosed with eczema + poison ivy dermatitis.

## 2022-01-12 NOTE — Assessment & Plan Note (Signed)
Managed with PRN PPI and diet modifications

## 2022-01-12 NOTE — Assessment & Plan Note (Signed)
Preventative protocols reviewed and updated unless pt declined. Discussed healthy diet and lifestyle.  

## 2022-01-12 NOTE — Assessment & Plan Note (Signed)
Chronic, stable period. Has seen ortho Louanne Skye). Continue PRN tramadol, flexeril

## 2022-01-12 NOTE — Assessment & Plan Note (Addendum)
Congratulated on weight loss to date, encouraged ongoing efforts.  Just started seeing bariatric clinic in [place], taking new weight loss supplement.

## 2022-01-24 ENCOUNTER — Encounter: Payer: Self-pay | Admitting: Gastroenterology

## 2022-02-15 ENCOUNTER — Other Ambulatory Visit: Payer: Self-pay | Admitting: Family Medicine

## 2022-02-15 NOTE — Telephone Encounter (Signed)
Too soon.  Rx sent on 01/12/22, #90/3 to [facility], [place].  

## 2022-03-03 ENCOUNTER — Other Ambulatory Visit: Payer: Self-pay | Admitting: Family Medicine

## 2022-03-03 NOTE — Telephone Encounter (Signed)
Too soon.  Rx sent on 01/12/22, #90/3 to [facility] [address], [place].

## 2022-04-05 ENCOUNTER — Encounter (INDEPENDENT_AMBULATORY_CARE_PROVIDER_SITE_OTHER): Payer: BC Managed Care – PPO | Admitting: Family Medicine

## 2022-04-05 DIAGNOSIS — Z7184 Encounter for health counseling related to travel: Secondary | ICD-10-CM

## 2022-04-07 NOTE — Telephone Encounter (Signed)
Please see the MyChart message reply(ies) for my assessment and plan.  The patient gave consent for this Medical Advice Message and is aware that it may result in a bill to their insurance company as well as the possibility that this may result in a co-payment or deductible. They are an established patient, but are not seeking medical advice exclusively about a problem treated during an in person or video visit in the last 7 days. I did not recommend an in person or video visit within 7 days of my reply.  I spent a total of 10 minutes cumulative time within 7 days through [facility] messaging Ria Bush, MD

## 2022-04-11 MED ORDER — CIPROFLOXACIN HCL 500 MG PO TABS: 500.0000 mg | ORAL_TABLET | Freq: Two times a day (BID) | ORAL | 0 refills | Status: AC

## 2022-04-11 NOTE — Addendum Note (Signed)
Addended by: Ria Bush on: 04/11/2022 09:52 AM   Modules accepted: Orders

## 2022-04-29 ENCOUNTER — Other Ambulatory Visit: Payer: Self-pay | Admitting: Family Medicine

## 2022-04-29 DIAGNOSIS — I10 Essential (primary) hypertension: Secondary | ICD-10-CM

## 2022-05-25 ENCOUNTER — Ambulatory Visit: Payer: BC Managed Care – PPO | Admitting: Family Medicine

## 2022-05-25 ENCOUNTER — Encounter: Payer: Self-pay | Admitting: Family Medicine

## 2022-05-25 VITALS — BP 130/70 | HR 73 | Temp 97.9°F | Ht 66.5 in | Wt 178.4 lb

## 2022-05-25 DIAGNOSIS — L659 Nonscarring hair loss, unspecified: Secondary | ICD-10-CM

## 2022-05-25 NOTE — Progress Notes (Signed)
Patient ID: Carolyn [name], female    DOB: 07-24-1965, 57 y.o.   MRN: [id number]  This visit was conducted in person.  BP 130/70 (BP Location: Right Arm, Cuff Size: Normal)   Pulse 73   Temp 97.9 F (36.6 C) (Temporal)   Ht 5' 6.5" (1.689 m)   Wt 178 lb 6 oz (80.9 kg)   SpO2 99%   BMI 28.36 kg/m   BP Readings from Last 3 Encounters:  05/25/22 130/70  01/11/22 136/84  11/18/21 138/70   CC: hair loss Subjective:   HPI: Carolyn [name] is a 57 y.o. female presenting on 05/25/2022 for Hair/Scalp Problem (C/o worsening hair loss after having Covid 2022.)   Recent trip to [place] - enjoyed this trip with her daughter.   Notes worsened hair loss since COVID infection 10/2020.  She saw several providers including dermatology Dr Domenic Polite. Told allergic reaction + poison ivy caused previous rash treated with several steroid creams including clobetasol propionate 0.05% solution to rash in scalp.   Since October 2023 she feels hair loss has progressed - hair is falling out in clumps, notes worsened hair loss ie in the shower. Hair los diffusely, she doesn't note new hairs developing.   She notes decreased hair growth to legs and under arms, still notes hair around groin.   Notes ongoing high stress - both parents moved in with her, new grandson handicapped, daughter with pulmonary embolism.   Notes some increased BP readings recently.  No pica. No h/o iron deficiency.  Nails growing well.   S/p hysterectomy 2011, ovaries remained.  No hot flashes, breast tenderness, mood changes.  Unsure of menopause.      Relevant past medical, surgical, family and social history reviewed and updated as indicated. Interim medical history since our last visit reviewed. Allergies and medications reviewed and updated. Outpatient Medications Prior to Visit  Medication Sig Dispense Refill   amLODipine (NORVASC) 10 MG tablet TAKE 1 TABLET(10 MG) BY MOUTH DAILY 90 tablet 3   Cholecalciferol  (VITAMIN D3) 1.25 MG (50000 UT) CAPS Take 1 capsule by mouth once a week. 12 capsule 3   cyclobenzaprine (FLEXERIL) 10 MG tablet TAKE 1/2 TO 1 TABLET BY MOUTH TWICE DAILY AS NEEDED FOR MUSCLE SPASMS 30 tablet 3   diphenhydrAMINE (BENADRYL) 25 MG tablet Take 25 mg by mouth as needed.     hydrOXYzine (ATARAX) 25 MG tablet TAKE 1 TABLET(25 MG) BY MOUTH THREE TIMES DAILY AS NEEDED 30 tablet 3   losartan-hydrochlorothiazide (HYZAAR) 100-12.5 MG tablet Take 1 tablet by mouth daily. 90 tablet 3   Nutritional Supplements (LIPOTROPIC COMPLEX PO) Take by mouth in the morning, at noon, and at bedtime.     omeprazole (PRILOSEC) 40 MG capsule Take 1 capsule (40 mg total) by mouth daily as needed. 30 capsule 3   Psyllium (METAMUCIL FIBER PO) Take by mouth as needed.     scopolamine (TRANSDERM-SCOP, 1.5 MG,) 1 MG/3DAYS Place 1 patch (1.5 mg total) onto the skin every 3 (three) days. 10 patch 1   traMADol (ULTRAM) 50 MG tablet Take 1 tablet (50 mg total) by mouth 2 (two) times daily as needed for moderate pain. 20 tablet 0   No facility-administered medications prior to visit.     Per HPI unless specifically indicated in ROS section below Review of Systems  Objective:  BP 130/70 (BP Location: Right Arm, Cuff Size: Normal)   Pulse 73   Temp 97.9 F (36.6 C) (Temporal)   Ht  5' 6.5" (1.689 m)   Wt 178 lb 6 oz (80.9 kg)   SpO2 99%   BMI 28.36 kg/m   Wt Readings from Last 3 Encounters:  05/25/22 178 lb 6 oz (80.9 kg)  01/11/22 191 lb 2 oz (86.7 kg)  11/18/21 216 lb 8 oz (98.2 kg)      Physical Exam Vitals and nursing note reviewed.  Constitutional:      Appearance: Normal appearance. She is not ill-appearing.  HENT:     Head: Normocephalic and atraumatic.     Mouth/Throat:     Mouth: Mucous membranes are moist.     Pharynx: Oropharynx is clear. No oropharyngeal exudate or posterior oropharyngeal erythema.  Eyes:     Extraocular Movements: Extraocular movements intact.     Pupils: Pupils are  equal, round, and reactive to light.  Neck:     Thyroid: No thyroid mass or thyromegaly.  Cardiovascular:     Rate and Rhythm: Normal rate and regular rhythm.     Pulses: Normal pulses.     Heart sounds: Normal heart sounds. No murmur heard. Pulmonary:     Effort: Pulmonary effort is normal. No respiratory distress.     Breath sounds: Normal breath sounds. No wheezing or rales.  Musculoskeletal:     Cervical back: Normal range of motion and neck supple. No rigidity.     Right lower leg: No edema.     Left lower leg: No edema.  Lymphadenopathy:     Cervical: No cervical adenopathy.  Skin:    General: Skin is warm and dry.     Findings: No rash.     Comments:  No scalp erythema or irritation or rash present  Negative hair pull test  Neurological:     Mental Status: She is alert.  Psychiatric:        Mood and Affect: Mood normal.        Behavior: Behavior normal.       Results for orders placed or performed in visit on 12/30/21  CBC with Differential/Platelet  Result Value Ref Range   WBC 4.9 4.0 - 10.5 K/uL   RBC 4.50 3.87 - 5.11 Mil/uL   Hemoglobin 11.9 (L) 12.0 - 15.0 g/dL   HCT 35.3 (L) 36.0 - 46.0 %   MCV 78.5 78.0 - 100.0 fl   MCHC 33.8 30.0 - 36.0 g/dL   RDW 15.5 11.5 - 15.5 %   Platelets 263.0 150.0 - 400.0 K/uL   Neutrophils Relative % 67.6 43.0 - 77.0 %   Lymphocytes Relative 15.3 12.0 - 46.0 %   Monocytes Relative 9.8 3.0 - 12.0 %   Eosinophils Relative 6.3 (H) 0.0 - 5.0 %   Basophils Relative 1.0 0.0 - 3.0 %   Neutro Abs 3.3 1.4 - 7.7 K/uL   Lymphs Abs 0.8 0.7 - 4.0 K/uL   Monocytes Absolute 0.5 0.1 - 1.0 K/uL   Eosinophils Absolute 0.3 0.0 - 0.7 K/uL   Basophils Absolute 0.1 0.0 - 0.1 K/uL  Lipid panel  Result Value Ref Range   Cholesterol 97 0 - 200 mg/dL   Triglycerides 64.0 0.0 - 149.0 mg/dL   HDL 36.20 (L) >39.00 mg/dL   VLDL 12.8 0.0 - 40.0 mg/dL   LDL Cholesterol 48 0 - 99 mg/dL   Total CHOL/HDL Ratio 3    NonHDL 60.35   VITAMIN D 25 Hydroxy  (Vit-D Deficiency, Fractures)  Result Value Ref Range   VITD 75.49 30.00 - 100.00 ng/mL   Lab  Results  Component Value Date   TSH 0.79 01/06/2021    Assessment & Plan:   Problem List Items Addressed This Visit     Hair loss - Primary    She describes diffuse non-scarring hair loss but not in 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  Will start with labs to rule out reversible cause (TSH, iron panel).  ?menopause related Reviewed relation of stress to hair loss.  Rec trial biotin hair/nail supplement daily x 1 month.  Discussed possible rogaine use.  Consider dermatology referral if not better with above.       Relevant Orders   TSH   CBC with Differential/Platelet   Basic metabolic panel   Ferritin   IBC panel     No orders of the defined types were placed in this encounter.   Orders Placed This Encounter  Procedures   TSH   CBC with Differential/Platelet   Basic metabolic panel   Ferritin   IBC panel    Patient Instructions  Labs today to evaluate for other causes of hair loss.  Take biotin vitamin daily for a month.  If no better, could consider topical minoxidil (Rogaine).   Follow up plan: Return if symptoms worsen or fail to improve.  Ria Bush, MD

## 2022-05-25 NOTE — Assessment & Plan Note (Addendum)
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  Will start with labs to rule out reversible cause (TSH, iron panel).  ?menopause related Reviewed relation of stress to hair loss.  Rec trial biotin hair/nail supplement daily x 1 month.  Discussed possible rogaine use.  Consider dermatology referral if not better with above.

## 2022-05-25 NOTE — Patient Instructions (Addendum)
Labs today to evaluate for other causes of hair loss.  Take biotin vitamin daily for a month.  If no better, could consider topical minoxidil (Rogaine).

## 2022-05-26 ENCOUNTER — Other Ambulatory Visit: Payer: Self-pay | Admitting: Family Medicine

## 2022-05-26 LAB — CBC WITH DIFFERENTIAL/PLATELET
Basophils Absolute: 0.1 10*3/uL (ref 0.0–0.1)
Basophils Relative: 1.1 % (ref 0.0–3.0)
Eosinophils Absolute: 0.3 10*3/uL (ref 0.0–0.7)
Eosinophils Relative: 2.6 % (ref 0.0–5.0)
HCT: 38.1 % (ref 36.0–46.0)
Hemoglobin: 13.3 g/dL (ref 12.0–15.0)
Lymphocytes Relative: 13.9 % (ref 12.0–46.0)
Lymphs Abs: 1.5 10*3/uL (ref 0.7–4.0)
MCHC: 34.8 g/dL (ref 30.0–36.0)
MCV: 87 fl (ref 78.0–100.0)
Monocytes Absolute: 0.7 10*3/uL (ref 0.1–1.0)
Monocytes Relative: 6.8 % (ref 3.0–12.0)
Neutro Abs: 8 10*3/uL — ABNORMAL HIGH (ref 1.4–7.7)
Neutrophils Relative %: 75.6 % (ref 43.0–77.0)
Platelets: 307 10*3/uL (ref 150.0–400.0)
RBC: 4.38 Mil/uL (ref 3.87–5.11)
RDW: 13.7 % (ref 11.5–15.5)
WBC: 10.6 10*3/uL — ABNORMAL HIGH (ref 4.0–10.5)

## 2022-05-26 LAB — IBC PANEL
Iron: 115 ug/dL (ref 42–145)
Saturation Ratios: 30.2 % (ref 20.0–50.0)
TIBC: 380.8 ug/dL (ref 250.0–450.0)
Transferrin: 272 mg/dL (ref 212.0–360.0)

## 2022-05-26 LAB — BASIC METABOLIC PANEL
BUN: 23 mg/dL (ref 6–23)
CO2: 30 mEq/L (ref 19–32)
Calcium: 9.7 mg/dL (ref 8.4–10.5)
Chloride: 100 mEq/L (ref 96–112)
Creatinine, Ser: 0.88 mg/dL (ref 0.40–1.20)
GFR: 73.49 mL/min (ref >60.00)
Glucose, Bld: 125 mg/dL — ABNORMAL HIGH (ref 70–99)
Potassium: 3.3 mEq/L — ABNORMAL LOW (ref 3.5–5.1)
Sodium: 139 mEq/L (ref 135–145)

## 2022-05-26 LAB — FERRITIN: Ferritin: 29.3 ng/mL (ref 10.0–291.0)

## 2022-05-26 LAB — TSH: TSH: 0.48 u[IU]/mL (ref 0.35–5.50)

## 2022-05-26 MED ORDER — IRON (FERROUS SULFATE) 325 (65 FE) MG PO TABS: 325.0000 mg | ORAL_TABLET | ORAL | Status: DC

## 2022-06-17 ENCOUNTER — Telehealth: Payer: Self-pay | Admitting: Gastroenterology

## 2022-06-17 NOTE — Telephone Encounter (Signed)
Good Afternoon Dr Silverio Decamp  Inbound call from patient requesting  transfer of care to Dr Havery Moros.  Patient states reason for the transfer is due to her not feeling well before or after her last procedure.  Please advise. Thank you

## 2022-06-17 NOTE — Telephone Encounter (Signed)
Sure. Patient  had colonoscopy in 2018 and never contacted office with any issues or complaints before or after procedure

## 2022-06-22 NOTE — Telephone Encounter (Signed)
Is she requesting her follow up colonoscopy for which she is overdue, or a clinic visit for another reason?  Can direct book in the [facility] for colonoscopy with me if she is wishing to schedule that, or clinic visit if she has other concerns that she wishes to be addressed. Thanks

## 2022-06-22 NOTE — Telephone Encounter (Signed)
Good Morning Dr Havery Moros   Please advise on previous note   Thank you

## 2022-06-29 NOTE — Telephone Encounter (Signed)
Left message for patient to call back and schedule colon procedure.

## 2022-07-04 ENCOUNTER — Encounter: Payer: Self-pay | Admitting: Gastroenterology

## 2022-09-14 ENCOUNTER — Telehealth: Payer: Self-pay

## 2022-09-14 NOTE — Telephone Encounter (Signed)
Dr. Adela Lank I see this pt transferred care to you she had a procedure done here at [facility] despite being marked a difficult intubation. Per Rudell Cobb pt needs to be done at one of the hospital facilities. Please advise if patient needs an OV or direct to hospital.

## 2022-09-14 NOTE — Telephone Encounter (Signed)
[place]. Can you please clarify if this patient is appropriate for [facility]. She was marked as a difficult intubation in 2014 and 2016 due to neck mobility but her last colon in 2018 was done here at [facility]. Please advise

## 2022-09-14 NOTE — Telephone Encounter (Signed)
Carolyn [name],   This pt is a documented difficult intubation and her procedure will need to be done at the hospital.   Thanks,  Cathlyn Parsons

## 2022-09-14 NOTE — Telephone Encounter (Signed)
Okay sorry to hear this. Can you book her an office visit with me or APP to discuss. Thanks

## 2022-09-14 NOTE — Telephone Encounter (Signed)
Attempted to call patient to make her aware he PV and colonoscopy would be cancelled at this time. VM left for patient to call back to further discuss.

## 2022-09-15 NOTE — Telephone Encounter (Signed)
Attempted to call patient to make OV. Unable to reach at this time VM left for patient to call back. I made the patient aware that due to her being documented as a diff airway we had to cancel her procedures. If she returns the call please have her scheduled for an OV with Dr. Adela Lank or one of the APPs, unless she has further questions.

## 2022-09-20 NOTE — Telephone Encounter (Signed)
Patient called to return phone call. Please advise, thank you.  

## 2022-09-20 NOTE — Telephone Encounter (Signed)
Patient requesting a call back to discuss the need of a appointment because she is schedule to have X rays with PCP. Please advise, thank you.

## 2022-09-20 NOTE — Telephone Encounter (Signed)
Lm on vm for patient to return call 

## 2022-09-21 NOTE — Telephone Encounter (Signed)
Pt returned call - she was scheduled for follow up with Dr. Adela Lank on 01/03/23 at 2:10 pm.

## 2022-09-21 NOTE — Telephone Encounter (Signed)
Lm on vm for patient to return call - please see note below from [facility] nurse that was sent to patient.   Your procedure was cancelled due to your past surgical history of having a difficult airway. For your safety Dr. Retia Passe has requested that you been seen in the office prior to completing your colonoscopy.

## 2022-10-07 ENCOUNTER — Encounter: Payer: BC Managed Care – PPO | Admitting: Gastroenterology

## 2023-01-03 ENCOUNTER — Ambulatory Visit: Payer: BC Managed Care – PPO | Admitting: Gastroenterology

## 2023-01-03 ENCOUNTER — Encounter: Payer: Self-pay | Admitting: Gastroenterology

## 2023-01-03 VITALS — BP 120/60 | HR 97 | Ht 68.0 in | Wt 187.0 lb

## 2023-01-03 DIAGNOSIS — Z86010 Personal history of colonic polyps: Secondary | ICD-10-CM

## 2023-01-03 DIAGNOSIS — T884XXS Failed or difficult intubation, sequela: Secondary | ICD-10-CM

## 2023-01-03 DIAGNOSIS — K5900 Constipation, unspecified: Secondary | ICD-10-CM | POA: Diagnosis not present

## 2023-01-03 DIAGNOSIS — R1310 Dysphagia, unspecified: Secondary | ICD-10-CM

## 2023-01-03 MED ORDER — SUTAB 1479-225-188 MG PO TABS
24.0000 | ORAL_TABLET | Freq: Once | ORAL | 0 refills | Status: DC
Start: 2023-01-03 — End: 2023-01-03

## 2023-01-03 MED ORDER — SUTAB 1479-225-188 MG PO TABS
24.0000 | ORAL_TABLET | Freq: Once | ORAL | 0 refills | Status: AC
Start: 2023-01-03 — End: 2023-01-03

## 2023-01-03 MED ORDER — ONDANSETRON 4 MG PO TBDP: 4.0000 mg | ORAL_TABLET | Freq: Three times a day (TID) | ORAL | 0 refills | Status: DC | PRN

## 2023-01-03 NOTE — Progress Notes (Signed)
HPI :  57 y/o female with a history of colon polyps, history of difficult airway, history of cervical neck fusion surgery, here to reestablish care and discuss surveillance colonoscopy.  She was last seen by our office in September 2018, had a colonoscopy showing an 8 mm ascending sessile serrated polyp.  She was told she needed a follow-up exam in 5 years. She reported she did not do well with a bowel prep at all and felt "sick "before and after the procedure.  At baseline she does have some mild constipation, has a bowel movement once every other day.  She takes MiraLAX as needed but does not use it routinely.  Denies any hard stools or straining at baseline.  No blood in her stools.  She does have some occasional abdominal discomfort with a bowel movement which is then relieved after she moves her bowels.  She was scheduled directly for procedure at the [facility] in our office however when her chart was reviewed by our anesthesia staff and noted she was a difficult airway per anesthesia based on one of her prior operations.  In this light her case was canceled at the office and reschedule to discuss booking this at the hospital.  She was unaware she ever had a difficult airway.  She had a cervical spine surgery in 2016, this actually led to an esophageal laceration that required operative repair.  She is thinking about having another neck surgery within the next year or so if she can tolerate it.  I see that she is prescribed omeprazole 40 mg.  She states she only uses this as needed and not frequently, does not have much GERD symptoms at baseline.  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  She also has had some dysphagia following her neck surgery and injury of her esophagus.  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  She denies any dysphagia in her  chest.  She had an EGD in 2015 which was normal.  She continues to have dysphagia with pills and some foods which she states she does deals with it.  Things are stable in regard.  Otherwise she denies any cardiopulmonary symptoms of bother   Colonoscopy 01/18/2017: - The perianal and digital rectal examinations were normal. - A 8 mm polyp was found in the ascending colon. The polyp was sessile. The polyp was removed with a cold snare. Resection and retrieval were complete. - Non-bleeding internal hemorrhoids were found during retroflexion. The hemorrhoids were small. - The exam was otherwise without abnormality.   Surgical [P], ascending, polyp - SESSILE SERRATED POLYP (TWO FRAGMENTS). - BENIGN COLONIC MUCOSA (ONE FRAGMENT). - NO DYSPLASIA OR MALIGNANCY.  EGD 09/24/2013 - normal   Past Medical History:  Diagnosis Date   Anxiety    Arthritis    patient unsure   Depression    but doesn't require any meds   Diffuse idiopathic skeletal hyperostosis    Dry eyes    uses Restasis bid   Esophageal tear 03/04/2015   Left pyriformis sinus.    GERD (gastroesophageal reflux disease)    HCAP (healthcare-associated pneumonia) 03/05/2015   Headache(784.0)    MRI in 2007 bc of headaches   Heart murmur    New diagnosis   History of chicken pox    Hypertension  takes Hyzaar daily   Neck pain    bone spur on esophagus   PONV (postoperative nausea and vomiting) 2014   nausea and vomiting    Spinal stenosis in cervical region 03/03/2015   Urge incontinence      Past Surgical History:  Procedure Laterality Date   ABDOMINAL HYSTERECTOMY  2011   bleeding - ovaries remain   ANTERIOR CERVICAL DECOMP/DISCECTOMY FUSION N/A 08/21/2012   Procedure: Excision of exostosis C2-3,C3-4, C4-5;  Surgeon: Kerrin Champagne, MD;  Location: [facility] OR;  Service: Orthopedics;  Laterality: N/A;   ANTERIOR CERVICAL DECOMP/DISCECTOMY FUSION N/A 03/03/2015   Procedure: ANTERIOR CERVICAL DISCECTOMY AND FUSION WITH PARTIAL  VERTEBRECTOMY C3 AND C4, CERVICAL PLATE AND SCREWS, ALLOGRAFT, LOCAL BONE GRAFT, VIVIGEN;  Surgeon: Kerrin Champagne, MD;  Location: [facility] OR;  Service: Orthopedics;  Laterality: N/A;   COLONOSCOPY  12/2016   1 SSP, rpt 5 yrs - trouble tolerating prep (Nandigam)   DIRECT LARYNGOSCOPY  03/03/2015   Procedure: DIRECT LARYNGOSCOPY AND PLACEMENT OF FEEDING TUBE;  Surgeon: Flo Shanks, MD;  Location: [facility] OR;  Service: ENT;;   ESOPHAGOGASTRODUODENOSCOPY  12/2013   WNL (Outlaw)   REPAIR OF ESOPHAGUS  03/03/2015   Procedure: REPAIR OF ESOPHAGUS;  Surgeon: Flo Shanks, MD;  Location: [facility] OR;  Service: ENT;;   Family History  Problem Relation Age of Onset   Hypertension Mother    Melanoma Mother    Heart murmur Father    Hypertension Father    Colon polyps Father    Skin cancer Father    Lung cancer Maternal Grandmother        non smoker   Stomach cancer Maternal Grandfather 56   Lung cancer Paternal Grandmother        smoker   Clotting disorder Daughter        s/p 2 DVTs on blood thinner   Uterine cancer Paternal Aunt    Diabetes Neg Hx    CAD Neg Hx    Stroke Neg Hx    Social History   Tobacco Use   Smoking status: Never   Smokeless tobacco: Never  Vaping Use   Vaping status: Never Used  Substance Use Topics   Alcohol use: Yes    Comment: rarely   Drug use: No   Current Outpatient Medications  Medication Sig Dispense Refill   amLODipine (NORVASC) 10 MG tablet TAKE 1 TABLET(10 MG) BY MOUTH DAILY 90 tablet 3   Cholecalciferol (VITAMIN D3) 1.25 MG (50000 UT) CAPS Take 1 capsule by mouth once a week. 12 capsule 3   cyclobenzaprine (FLEXERIL) 10 MG tablet TAKE 1/2 TO 1 TABLET BY MOUTH TWICE DAILY AS NEEDED FOR MUSCLE SPASMS 30 tablet 3   diphenhydrAMINE (BENADRYL) 25 MG tablet Take 25 mg by mouth as needed.     hydrOXYzine (ATARAX) 25 MG tablet TAKE 1 TABLET(25 MG) BY MOUTH THREE TIMES DAILY AS NEEDED 30 tablet 3   Iron, Ferrous Sulfate, 325 (65 Fe) MG TABS Take 325 mg by mouth every  Monday, Wednesday, and Friday.     losartan-hydrochlorothiazide (HYZAAR) 100-12.5 MG tablet Take 1 tablet by mouth daily. 90 tablet 3   Nutritional Supplements (LIPOTROPIC COMPLEX PO) Take by mouth in the morning, at noon, and at bedtime.     omeprazole (PRILOSEC) 40 MG capsule Take 1 capsule (40 mg total) by mouth daily as needed. 30 capsule 3   Psyllium (METAMUCIL FIBER PO) Take by mouth as needed.     scopolamine (TRANSDERM-SCOP, 1.5 MG,) 1  MG/3DAYS Place 1 patch (1.5 mg total) onto the skin every 3 (three) days. 10 patch 1   traMADol (ULTRAM) 50 MG tablet Take 1 tablet (50 mg total) by mouth 2 (two) times daily as needed for moderate pain. 20 tablet 0   No current facility-administered medications for this visit.   Allergies  Allergen Reactions   Ace Inhibitors Swelling     Review of Systems: All systems reviewed and negative except where noted in HPI.   Lab Results  Component Value Date   WBC 10.6 (H) 05/25/2022   HGB 13.3 05/25/2022   HCT 38.1 05/25/2022   MCV 87.0 05/25/2022   PLT 307.0 05/25/2022    Lab Results  Component Value Date   NA 139 05/25/2022   CL 100 05/25/2022   K 3.3 (L) 05/25/2022   CO2 30 05/25/2022   BUN 23 05/25/2022   CREATININE 0.88 05/25/2022   GFR 73.49 05/25/2022   CALCIUM 9.7 05/25/2022   PHOS 2.7 11/16/2016   ALBUMIN 4.3 11/18/2021   GLUCOSE 125 (H) 05/25/2022    Lab Results  Component Value Date   ALT 15 11/18/2021   AST 16 11/18/2021   ALKPHOS 77 11/18/2021   BILITOT 0.4 11/18/2021     Physical Exam: BP 120/60   Pulse 97   Ht 5\' 8"  (1.727 m)   Wt 187 lb (84.8 kg)   BMI 28.43 kg/m  Constitutional: Pleasant,well-developed, female in no acute distress. HEENT: Normocephalic and atraumatic. Conjunctivae are normal. No scleral icterus. Neck supple.  Cardiovascular: Normal rate, regular rhythm.  Pulmonary/chest: Effort normal and breath sounds normal.  Abdominal: Soft, nondistended, nontender. There are no masses palpable.   Extremities: no edema Neurological: Alert and oriented to person place and time. Skin: Skin is warm and dry. No rashes noted. Psychiatric: Normal mood and affect. Behavior is normal.   ASSESSMENT: 57 y.o. female here for assessment of the following  1. History of colon polyps   2. Constipation, unspecified constipation type   3. Difficult airway for intubation, sequela   4. Dysphagia, unspecified type    History of right sided sessile serrated polyp removed back in 2018.  She is due for surveillance colonoscopy in this light.  Unfortunately given she has reported difficult airway for intubation per anesthesia in the past, she is not a candidate for outpatient endoscopy.  In this light her case must be done in the hospital for anesthesia support in the rare circumstance she had an issue with her respiratory status.  I explained this to her and she understands and is willing to proceed at the hospital.  Will put her on the schedule the next available opening.  We discussed her mild constipation.  I recommend she use MiraLAX every day at least 1 week prior to the exam to make sure the prep works adequately for her.  She requested Sutab prep which is fine, she had some tolerance with the liquid prep at the last exam.  I will also give her some Zofran to help with nausea and she should use this before each half of her prep.  Otherwise continue MiraLAX as needed for baseline mild constipation.  Otherwise reviewed her history of dysphagia.  She has had C-spine fusion surgery with esophageal laceration, she has had some problems swallowing since that time.  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  I suspect her symptoms are oropharyngeal in nature based on how she describes them today, we  discussed if she want to pursue any workup for esophageal etiologies or consider an empiric dilation to see if it would help it.  We discussed what EGD would entail.  Her  last EGD in 2015 was normal.  She states this issue is not high on her priority of things to address and she has gotten used to it.  She will think about if she wants an empiric dilation, discussed risks of that.  She can contact me if she changes her mind or wishes to pursue that or a barium study at some point but again I agree that her symptoms are more than likely oropharyngeal in nature.  She will continue omeprazole as needed for reflux, does not use this routinely.   PLAN: - schedule colonoscopy at the hospital secondary to difficult airway for intubation  - use Miralax daily for one week prior to prep and then PRN for mild constipation - she would prefer Sutab prep - will give Zofran 4mg  ODT - one tab prior to each half of prep - information given for [facility] if she needs transport for her procedure - offered EGD or barium swallow for dysphagia - she wants to think about this further as outlined above, more than likely benign/oropharyngeal in relation to her prior interventions.  Harlin Rain, MD [facility]  CC: Eustaquio Boyden, MD

## 2023-01-03 NOTE — Patient Instructions (Addendum)
You have been scheduled for a colonoscopy at [facility] with Dr. Adela Lank. Please follow written instructions given to you at your visit today.   Please pick up your prep supplies at the pharmacy within the next 1-3 days.  If you use inhalers (even only as needed), please bring them with you on the day of your procedure.  DO NOT TAKE 7 DAYS PRIOR TO TEST- Trulicity (dulaglutide) Ozempic, Wegovy (semaglutide) Mounjaro (tirzepatide) Bydureon Bcise (exanatide extended release)  DO NOT TAKE 1 DAY PRIOR TO YOUR TEST Rybelsus (semaglutide) Adlyxin (lixisenatide) Victoza (liraglutide) Byetta (exanatide) ___________________________________________________________________________   Use Miralax daily for 5 days prior to your procedure.  Then use daily as needed  We have sent the following medications to your pharmacy for you to pick up at your convenience: Zofran 4 mg ODT: Take one tablet 30 minutes before each colon prep (at 5:30 pm the night before and 2:00am the morning of)  We are giving you information regarding [employer] that can provide transportation services on there day of the procedure.  Thank you for entrusting me with your care and for choosing [facility], Dr. Ileene Patrick    If your blood pressure at your visit was 140/90 or greater, please contact your primary care physician to follow up on this. ______________________________________________________  If you are age 57 or older, your body mass index should be between 23-30. Your Body mass index is 28.43 kg/m. If this is out of the aforementioned range listed, please consider follow up with your Primary Care Provider.  If you are age 57 or younger, your body mass index should be between 19-25. Your Body mass index is 28.43 kg/m. If this is out of the aformentioned range listed, please consider follow up with your Primary Care Provider.   ________________________________________________________  The [facility] providers would like to encourage you to use [facility] to communicate with providers for non-urgent requests or questions.  Due to long hold times on the telephone, sending your provider a message by [facility] may be a faster and more efficient way to get a response.  Please allow 48 business hours for a response.  Please remember that this is for non-urgent requests.  _______________________________________________________  Due to recent changes in healthcare laws, you may see the results of your imaging and laboratory studies on MyChart before your provider has had a chance to review them.  We understand that in some cases there may be results that are confusing or concerning to you. Not all laboratory results come back in the same time frame and the provider may be waiting for multiple results in order to interpret others.  Please give [place] 48 hours in order for your provider to thoroughly review all the results before contacting the office for clarification of your results.

## 2023-01-04 ENCOUNTER — Other Ambulatory Visit: Payer: BC Managed Care – PPO

## 2023-01-13 ENCOUNTER — Encounter: Payer: BC Managed Care – PPO | Admitting: Family Medicine

## 2023-02-23 ENCOUNTER — Other Ambulatory Visit: Payer: Self-pay | Admitting: Medical Genetics

## 2023-02-23 DIAGNOSIS — Z006 Encounter for examination for normal comparison and control in clinical research program: Secondary | ICD-10-CM

## 2023-03-06 NOTE — Progress Notes (Signed)
Attempted to obtain medical history for pre op call via telephone, unable to reach at this time. HIPAA compliant voicemail message left requesting return call to pre surgical testing department.

## 2023-03-07 ENCOUNTER — Other Ambulatory Visit: Payer: BC Managed Care – PPO

## 2023-03-07 NOTE — Progress Notes (Signed)
Pre op call eval Name:Carolyn [name] Glow MD Cardiologist- Dr.Nishan  [phone] Echo-2014 Cath-n/a Stress-n/a ICD/PM- n/a Blood thinner- n/a GLP-1- n/a  Hx: HTN, Murmur, cervical spine fusion- listed difficult airway had an esophageal laceration which had to be operated on. Patient reports seeing a cardiologist but hasnt been a while (since 2014) and only sees when they tell her she needs to be seen. She reports no current cardiac/breathing issues.  Anesthesia Review: Yes

## 2023-03-09 ENCOUNTER — Telehealth: Payer: Self-pay | Admitting: Gastroenterology

## 2023-03-09 NOTE — Telephone Encounter (Signed)
Patient called and stated that she is being charged incorrectly for her colonoscopy. Please advise.

## 2023-03-10 NOTE — Telephone Encounter (Signed)
Inbound call from patient, advised patient of the personal hx of polyps. Patient states she wishes to do a payment plan after procedure is done to pay balance. Patient understood and has no further questions.

## 2023-03-10 NOTE — Telephone Encounter (Signed)
Patient returned your call and requested a call back. A good call back number is [phone]. Please Advise.

## 2023-03-13 ENCOUNTER — Encounter: Payer: BC Managed Care – PPO | Admitting: Family Medicine

## 2023-03-13 ENCOUNTER — Other Ambulatory Visit: Payer: Self-pay

## 2023-03-13 ENCOUNTER — Encounter (HOSPITAL_COMMUNITY): Payer: Self-pay | Admitting: Gastroenterology

## 2023-03-13 ENCOUNTER — Ambulatory Visit (HOSPITAL_COMMUNITY): Payer: BC Managed Care – PPO | Admitting: Anesthesiology

## 2023-03-13 ENCOUNTER — Encounter (HOSPITAL_COMMUNITY)
Admission: RE | Disposition: A | Discharge status: Home / Self Care | Payer: Self-pay | Source: Home / Self Care | Attending: Gastroenterology

## 2023-03-13 ENCOUNTER — Ambulatory Visit (HOSPITAL_COMMUNITY)
Admission: RE | Admit: 2023-03-13 | Discharge: 2023-03-13 | Disposition: A | Discharge status: Home / Self Care | Payer: BC Managed Care – PPO | Attending: Gastroenterology | Admitting: Gastroenterology

## 2023-03-13 DIAGNOSIS — Z1211 Encounter for screening for malignant neoplasm of colon: Secondary | ICD-10-CM | POA: Diagnosis present

## 2023-03-13 DIAGNOSIS — M1990 Unspecified osteoarthritis, unspecified site: Secondary | ICD-10-CM | POA: Insufficient documentation

## 2023-03-13 DIAGNOSIS — K5900 Constipation, unspecified: Secondary | ICD-10-CM

## 2023-03-13 DIAGNOSIS — T884XXS Failed or difficult intubation, sequela: Secondary | ICD-10-CM

## 2023-03-13 DIAGNOSIS — D122 Benign neoplasm of ascending colon: Secondary | ICD-10-CM | POA: Diagnosis not present

## 2023-03-13 DIAGNOSIS — K648 Other hemorrhoids: Secondary | ICD-10-CM | POA: Insufficient documentation

## 2023-03-13 DIAGNOSIS — Z79899 Other long term (current) drug therapy: Secondary | ICD-10-CM | POA: Diagnosis not present

## 2023-03-13 DIAGNOSIS — Z860100 Personal history of colon polyps, unspecified: Secondary | ICD-10-CM | POA: Diagnosis not present

## 2023-03-13 DIAGNOSIS — Z860101 Personal history of adenomatous and serrated colon polyps: Secondary | ICD-10-CM | POA: Insufficient documentation

## 2023-03-13 DIAGNOSIS — Z09 Encounter for follow-up examination after completed treatment for conditions other than malignant neoplasm: Secondary | ICD-10-CM

## 2023-03-13 DIAGNOSIS — I10 Essential (primary) hypertension: Secondary | ICD-10-CM | POA: Diagnosis not present

## 2023-03-13 DIAGNOSIS — K219 Gastro-esophageal reflux disease without esophagitis: Secondary | ICD-10-CM | POA: Diagnosis not present

## 2023-03-13 DIAGNOSIS — K644 Residual hemorrhoidal skin tags: Secondary | ICD-10-CM | POA: Diagnosis not present

## 2023-03-13 HISTORY — PX: POLYPECTOMY: SHX5525

## 2023-03-13 HISTORY — PX: COLONOSCOPY WITH PROPOFOL: SHX5780

## 2023-03-13 SURGERY — COLONOSCOPY WITH PROPOFOL
Anesthesia: Monitor Anesthesia Care

## 2023-03-13 MED ORDER — PROPOFOL 500 MG/50ML IV EMUL
INTRAVENOUS | Status: DC | PRN
  Administered 2023-03-13: 120 ug/kg/min via INTRAVENOUS

## 2023-03-13 MED ORDER — SODIUM CHLORIDE 0.9 % IV SOLN: INTRAVENOUS | Status: DC

## 2023-03-13 MED ORDER — LIDOCAINE HCL 1 % IJ SOLN
INTRAMUSCULAR | Status: DC | PRN
  Administered 2023-03-13: 60 mg via INTRADERMAL
  Administered 2023-03-13: 20 mg via INTRADERMAL

## 2023-03-13 MED ORDER — PROPOFOL 1000 MG/100ML IV EMUL
INTRAVENOUS | Status: AC
  Filled 2023-03-13: qty 100

## 2023-03-13 MED ORDER — PROPOFOL 10 MG/ML IV BOLUS
INTRAVENOUS | Status: DC | PRN
  Administered 2023-03-13 (×3): 20 mg via INTRAVENOUS
  Administered 2023-03-13: 30 mg via INTRAVENOUS

## 2023-03-13 SURGICAL SUPPLY — 21 items
ELECT REM PT RETURN 9FT ADLT (ELECTROSURGICAL)
ELECTRODE REM PT RTRN 9FT ADLT (ELECTROSURGICAL) IMPLANT
FCP BXJMBJMB 240X2.8X (CUTTING FORCEPS)
FLOOR PAD 36X40 (MISCELLANEOUS) ×2
FORCEPS BIOP RAD 4 LRG CAP 4 (CUTTING FORCEPS) IMPLANT
FORCEPS BIOP RJ4 240 W/NDL (CUTTING FORCEPS)
FORCEPS BXJMBJMB 240X2.8X (CUTTING FORCEPS) IMPLANT
INJECTOR/SNARE I SNARE (MISCELLANEOUS) IMPLANT
LUBRICANT JELLY 4.5OZ STERILE (MISCELLANEOUS) IMPLANT
MANIFOLD NEPTUNE II (INSTRUMENTS) IMPLANT
NDL SCLEROTHERAPY 25GX240 (NEEDLE) IMPLANT
NEEDLE SCLEROTHERAPY 25GX240 (NEEDLE)
PAD FLOOR 36X40 (MISCELLANEOUS) ×3 IMPLANT
PROBE APC STR FIRE (PROBE) IMPLANT
PROBE INJECTION GOLD 7FR (MISCELLANEOUS) IMPLANT
SNARE ROTATE MED OVAL 20MM (MISCELLANEOUS) IMPLANT
SYR 50ML LL SCALE MARK (SYRINGE) IMPLANT
TRAP SPECIMEN MUCOUS 40CC (MISCELLANEOUS) IMPLANT
TUBING ENDO SMARTCAP PENTAX (MISCELLANEOUS) IMPLANT
TUBING IRRIGATION ENDOGATOR (MISCELLANEOUS) ×3 IMPLANT
WATER STERILE IRR 1000ML POUR (IV SOLUTION) IMPLANT

## 2023-03-13 NOTE — Anesthesia Postprocedure Evaluation (Signed)
Anesthesia Post Note  Patient: Carolyn [name]  Procedure(s) Performed: COLONOSCOPY WITH PROPOFOL POLYPECTOMY     Patient location during evaluation: Endoscopy Anesthesia Type: MAC Level of consciousness: awake Pain management: pain level controlled Vital Signs Assessment: post-procedure vital signs reviewed and stable Respiratory status: spontaneous breathing Cardiovascular status: stable Postop Assessment: no apparent nausea or vomiting Anesthetic complications: no  No notable events documented.  Last Vitals:  Vitals:   03/13/23 0900 03/13/23 0905  BP: (!) 143/69 129/70  Pulse: 74 65  Resp: 17 15  Temp:    SpO2: 99% 100%    Last Pain:  Vitals:   03/13/23 0905  TempSrc:   PainSc: 0-No pain                 Caren Macadam

## 2023-03-13 NOTE — Transfer of Care (Signed)
Immediate Anesthesia Transfer of Care Note  Patient: Carolyn [name]  Procedure(s) Performed: COLONOSCOPY WITH PROPOFOL POLYPECTOMY  Patient Location: PACU and Endoscopy Unit  Anesthesia Type:MAC  Level of Consciousness: awake, alert , sedated, and patient cooperative  Airway & Oxygen Therapy: Patient Spontanous Breathing and Patient connected to face mask oxygen  Post-op Assessment: Report given to RN and Post -op Vital signs reviewed and stable  Post vital signs: Reviewed and stable  Last Vitals:  Vitals Value Taken Time  BP 129/67 03/13/23 0850  Temp    Pulse 73 03/13/23 0850  Resp 19 03/13/23 0850  SpO2 100 % 03/13/23 0850  Vitals shown include unfiled device data.  Last Pain:  Vitals:   03/13/23 0739  TempSrc: Tympanic  PainSc: 0-No pain         Complications: No notable events documented.

## 2023-03-13 NOTE — Discharge Instructions (Signed)

## 2023-03-13 NOTE — Op Note (Signed)
[facility] Patient Name: Carolyn [name] Procedure Date: 03/13/2023 MRN: [id number] Attending MD: Willaim Rayas. Adela Lank , MD, [id number] Date of Birth: 1965-06-07 CSN: [id number] Age: 57 Admit Type: Inpatient Procedure:                Colonoscopy Indications:              High risk colon cancer surveillance: Personal                            history of colonic polyps - last exam 12/2016 - 8mm                            sessile serrated polyp removed. Case done at the                            hospital for anesthesia support given history of                            reported difficult airway for intubation Providers:                Viviann Spare P. Adela Lank, MD, Rozetta Nunnery,                            Technician, Derinda Late, Technician Referring MD:              Medicines:                Monitored Anesthesia Care Complications:            No immediate complications. Estimated blood loss:                            Minimal. Estimated Blood Loss:     Estimated blood loss was minimal. Procedure:                Pre-Anesthesia Assessment:                           - Prior to the procedure, a History and Physical                            was performed, and patient medications and                            allergies were reviewed. The patient's tolerance of                            previous anesthesia was also reviewed. The risks                            and benefits of the procedure and the sedation                            options and risks were discussed with the patient.  All questions were answered, and informed consent                            was obtained. Prior Anticoagulants: The patient has                            taken no anticoagulant or antiplatelet agents. ASA                            Grade Assessment: II - A patient with mild systemic                            disease. After reviewing the risks and benefits,                             the patient was deemed in satisfactory condition to                            undergo the procedure.                           After obtaining informed consent, the colonoscope                            was passed under direct vision. Throughout the                            procedure, the patient's blood pressure, pulse, and                            oxygen saturations were monitored continuously. The                            CF-HQ190L ([id number]) Olympus colonoscope was                            introduced through the anus and advanced to the the                            cecum, identified by appendiceal orifice and                            ileocecal valve. The colonoscopy was performed                            without difficulty. The patient tolerated the                            procedure well. The quality of the bowel                            preparation was good. The ileocecal valve,  appendiceal orifice, and rectum were photographed. Scope In: 8:22:12 AM Scope Out: 8:41:45 AM Scope Withdrawal Time: 0 hours 14 minutes 47 seconds  Total Procedure Duration: 0 hours 19 minutes 33 seconds  Findings:      Skin tags were found on perianal exam.      A 3 mm polyp was found in the ascending colon. The polyp was sessile.       The polyp was removed with a cold snare. Resection and retrieval were       complete.      Internal hemorrhoids were found during retroflexion. The hemorrhoids       were small.      The exam was otherwise without abnormality. Impression:               - Perianal skin tags found on perianal exam.                           - One 3 mm polyp in the ascending colon, removed                            with a cold snare. Resected and retrieved.                           - Internal hemorrhoids.                           - The examination was otherwise normal. Moderate Sedation:      No moderate sedation, case  performed with MAC Recommendation:           - Patient has a contact number available for                            emergencies. The signs and symptoms of potential                            delayed complications were discussed with the                            patient. Return to normal activities tomorrow.                            Written discharge instructions were provided to the                            patient.                           - Resume previous diet.                           - Continue present medications.                           - Await pathology results. Procedure Code(s):        --- Professional ---  11914, Colonoscopy, flexible; with removal of                            tumor(s), polyp(s), or other lesion(s) by snare                            technique Diagnosis Code(s):        --- Professional ---                           Z86.010, Personal history of colonic polyps                           D12.2, Benign neoplasm of ascending colon                           K64.8, Other hemorrhoids                           K64.4, Residual hemorrhoidal skin tags CPT copyright 2022 American Medical Association. All rights reserved. The codes documented in this report are preliminary and upon coder review may  be revised to meet current compliance requirements. Viviann Spare P. [name], MD 03/13/2023 8:46:02 AM This report has been signed electronically. Number of Addenda: 0

## 2023-03-13 NOTE — Anesthesia Preprocedure Evaluation (Signed)
Anesthesia Evaluation  Patient identified by MRN, date of birth, ID band Patient awake    Reviewed: Allergy & Precautions, NPO status , Patient's Chart, lab work & pertinent test results  History of Anesthesia Complications (+) PONV, DIFFICULT AIRWAY and history of anesthetic complications  Airway Mallampati: III  TM Distance: >3 FB Neck ROM: Limited    Dental  (+) Teeth Intact   Pulmonary    breath sounds clear to auscultation       Cardiovascular hypertension, Pt. on medications  Rhythm:Regular Rate:Normal     Neuro/Psych  Headaches PSYCHIATRIC DISORDERS Anxiety Depression       GI/Hepatic Neg liver ROS,GERD  Medicated,,  Endo/Other  negative endocrine ROS    Renal/GU negative Renal ROS  negative genitourinary   Musculoskeletal  (+) Arthritis , Osteoarthritis,    Abdominal Normal abdominal exam  (+)   Peds negative pediatric ROS (+)  Hematology negative hematology ROS (+)   Anesthesia Other Findings   Reproductive/Obstetrics negative OB ROS                             Lab Results  Component Value Date   WBC 10.6 (H) 05/25/2022   HGB 13.3 05/25/2022   HCT 38.1 05/25/2022   MCV 87.0 05/25/2022   PLT 307.0 05/25/2022   Lab Results  Component Value Date   CREATININE 0.88 05/25/2022   BUN 23 05/25/2022   NA 139 05/25/2022   K 3.3 (L) 05/25/2022   CL 100 05/25/2022   CO2 30 05/25/2022   No results found for: "INR", "PROTIME"  EKG: sinus bradycardia.   Anesthesia Physical Anesthesia Plan  ASA: II  Anesthesia Plan: MAC   Post-op Pain Management:    Induction: Intravenous  PONV Risk Score and Plan: 3 and Propofol infusion and TIVA  Airway Management Planned: Natural Airway, Simple Face Mask and Nasal Cannula  Additional Equipment: None  Intra-op Plan:   Post-operative Plan:   Informed Consent: I have reviewed the patients History and Physical, chart, labs and  discussed the procedure including the risks, benefits and alternatives for the proposed anesthesia with the patient or authorized representative who has indicated his/her understanding and acceptance.       Plan Discussed with: CRNA  Anesthesia Plan Comments:         Anesthesia Quick Evaluation

## 2023-03-13 NOTE — H&P (Signed)
[facility] History and Physical   Primary Care Physician:  Eustaquio Boyden, MD   Reason for Procedure:   History of colon polyps  Plan:    colonoscopy     HPI: Carolyn [name] is a 57 y.o. female  here for colonoscopy surveillance - last exam done 12/2016 - 8mm SSP removed. No significant changes since I have seen her in the office a few months ago. Her case is being done at the hospital for anesthesia support given report of a "difficult airway" for intubation in the past.  Otherwise feels well without any cardiopulmonary symptoms today, no changes since I have seen her.    I have discussed risks / benefits of anesthesia and endoscopic procedure with Janyth Pupa and they wish to proceed with the exams as outlined today.    Past Medical History:  Diagnosis Date   Anxiety    Arthritis    patient unsure   Depression    but doesn't require any meds   Diffuse idiopathic skeletal hyperostosis    Dry eyes    uses Restasis bid   Esophageal tear 03/04/2015   Left pyriformis sinus.    GERD (gastroesophageal reflux disease)    HCAP (healthcare-associated pneumonia) 03/05/2015   Headache(784.0)    MRI in 2007 bc of headaches   Heart murmur    New diagnosis   History of chicken pox    Hypertension    takes Hyzaar daily   Neck pain    bone spur on esophagus   PONV (postoperative nausea and vomiting) 2014   nausea and vomiting    Spinal stenosis in cervical region 03/03/2015   Urge incontinence     Past Surgical History:  Procedure Laterality Date   ABDOMINAL HYSTERECTOMY  2011   bleeding - ovaries remain   ANTERIOR CERVICAL DECOMP/DISCECTOMY FUSION N/A 08/21/2012   Procedure: Excision of exostosis C2-3,C3-4, C4-5;  Surgeon: Kerrin Champagne, MD;  Location: [facility] OR;  Service: Orthopedics;  Laterality: N/A;   ANTERIOR CERVICAL DECOMP/DISCECTOMY FUSION N/A 03/03/2015   Procedure: ANTERIOR CERVICAL DISCECTOMY AND FUSION WITH PARTIAL VERTEBRECTOMY C3 AND C4, CERVICAL  PLATE AND SCREWS, ALLOGRAFT, LOCAL BONE GRAFT, VIVIGEN;  Surgeon: Kerrin Champagne, MD;  Location: [facility] OR;  Service: Orthopedics;  Laterality: N/A;   COLONOSCOPY  12/2016   1 SSP, rpt 5 yrs - trouble tolerating prep (Nandigam)   DIRECT LARYNGOSCOPY  03/03/2015   Procedure: DIRECT LARYNGOSCOPY AND PLACEMENT OF FEEDING TUBE;  Surgeon: Flo Shanks, MD;  Location: [facility] OR;  Service: ENT;;   ESOPHAGOGASTRODUODENOSCOPY  12/2013   WNL (Outlaw)   REPAIR OF ESOPHAGUS  03/03/2015   Procedure: REPAIR OF ESOPHAGUS;  Surgeon: Flo Shanks, MD;  Location: [facility] OR;  Service: ENT;;    Prior to Admission medications   Medication Sig Start Date End Date Taking? Authorizing Provider  amLODipine (NORVASC) 10 MG tablet TAKE 1 TABLET(10 MG) BY MOUTH DAILY 05/02/22   Eustaquio Boyden, MD  Cholecalciferol (VITAMIN D3) 1.25 MG (50000 UT) CAPS Take 1 capsule by mouth once a week. 01/12/22   Eustaquio Boyden, MD  cyclobenzaprine (FLEXERIL) 10 MG tablet TAKE 1/2 TO 1 TABLET BY MOUTH TWICE DAILY AS NEEDED FOR MUSCLE SPASMS 01/12/22   Eustaquio Boyden, MD  diphenhydrAMINE (BENADRYL) 25 MG tablet Take 25 mg by mouth as needed.    [provider]  hydrOXYzine (ATARAX) 25 MG tablet TAKE 1 TABLET(25 MG) BY MOUTH THREE TIMES DAILY AS NEEDED 01/12/22   Eustaquio Boyden, MD  Iron, Ferrous  Sulfate, 325 (65 Fe) MG TABS Take 325 mg by mouth every Monday, Wednesday, and Friday. 05/27/22   Eustaquio Boyden, MD  losartan-hydrochlorothiazide [facility]) 100-12.5 MG tablet Take 1 tablet by mouth daily. 01/12/22   Eustaquio Boyden, MD  Nutritional Supplements (LIPOTROPIC COMPLEX PO) Take by mouth in the morning, at noon, and at bedtime.    [provider]  omeprazole (PRILOSEC) 40 MG capsule Take 1 capsule (40 mg total) by mouth daily as needed. 01/12/22   Eustaquio Boyden, MD  ondansetron (ZOFRAN-ODT) 4 MG disintegrating tablet Take 1 tablet (4 mg total) by mouth every 8 (eight) hours as needed for nausea or vomiting. Take 1 tablet  30 minutes before each colonoscopy prep dose, then every 6 hours as needed 01/03/23   Ezana Hubbert, Willaim Rayas, MD  Psyllium (METAMUCIL FIBER PO) Take by mouth as needed.    [provider]  scopolamine (TRANSDERM-SCOP, 1.5 MG,) 1 MG/3DAYS Place 1 patch (1.5 mg total) onto the skin every 3 (three) days. 01/11/22   Eustaquio Boyden, MD  traMADol (ULTRAM) 50 MG tablet Take 1 tablet (50 mg total) by mouth 2 (two) times daily as needed for moderate pain. 01/12/22   Eustaquio Boyden, MD    Current Facility-Administered Medications  Medication Dose Route Frequency Provider Last Rate Last Admin   0.9 %  sodium chloride infusion   Intravenous Continuous Tigran Haynie, Willaim Rayas, MD        Allergies as of 01/03/2023 - Review Complete 01/03/2023  Allergen Reaction Noted   Ace inhibitors Swelling 09/24/2016    Family History  Problem Relation Age of Onset   Hypertension Mother    Melanoma Mother    Heart murmur Father    Hypertension Father    Colon polyps Father    Skin cancer Father    Lung cancer Maternal Grandmother        non smoker   Stomach cancer Maternal Grandfather 43   Lung cancer Paternal Grandmother        smoker   Clotting disorder Daughter        s/p 2 DVTs on blood thinner   Uterine cancer Paternal Aunt    Diabetes Neg Hx    CAD Neg Hx    Stroke Neg Hx     Social History   Socioeconomic History   Marital status: Divorced    Spouse name: Not on file   Number of children: 3   Years of education: Not on file   Highest education level: Not on file  Occupational History    Comment: Warehouse manager, [employer]  Tobacco Use   Smoking status: Never   Smokeless tobacco: Never  Vaping Use   Vaping status: Never Used  Substance and Sexual Activity   Alcohol use: Yes    Comment: rarely   Drug use: No   Sexual activity: Yes    Birth control/protection: Surgical  Other Topics Concern   Not on file  Social History Narrative   Caffeine use - coffee 5 cups daily     Married, lives with husband,  (2004) daughter, other 2 children out of house   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  Physical Exam: Vital signs Ht 5\' 8"  (1.727 m)   Wt 90.7 kg   BMI 30.41 kg/m   General:   Alert,  Well-developed, pleasant and cooperative in NAD Lungs:  Clear throughout to auscultation.   Heart:  Regular rate and rhythm Abdomen:  Soft, nontender and nondistended.   Neuro/Psych:  Alert and cooperative. Normal mood and affect. A and O x 3  Harlin Rain, MD [facility]

## 2023-03-14 LAB — SURGICAL PATHOLOGY

## 2023-03-15 ENCOUNTER — Encounter (HOSPITAL_COMMUNITY): Payer: Self-pay | Admitting: Gastroenterology

## 2023-03-16 ENCOUNTER — Telehealth: Payer: Self-pay | Admitting: Family Medicine

## 2023-03-16 DIAGNOSIS — I10 Essential (primary) hypertension: Secondary | ICD-10-CM

## 2023-03-16 DIAGNOSIS — R21 Rash and other nonspecific skin eruption: Secondary | ICD-10-CM

## 2023-03-16 MED ORDER — HYDROXYZINE HCL 25 MG PO TABS: ORAL_TABLET | ORAL | 0 refills | Status: DC

## 2023-03-16 MED ORDER — LOSARTAN POTASSIUM-HCTZ 100-12.5 MG PO TABS: 1.0000 | ORAL_TABLET | Freq: Every day | ORAL | 0 refills | Status: DC

## 2023-03-16 NOTE — Telephone Encounter (Signed)
Prescription Request  03/16/2023  LOV: 05/25/2022  What is the name of the medication or equipment? losartan-hydrochlorothiazide (HYZAAR) 100-12.5 MG tablet hydrOXYzine (ATARAX) 25 MG tablet   Have you contacted your pharmacy to request a refill? Yes   Which pharmacy would you like this sent to?   [id number] - [place], [place] - [address] AT [facility] [address] [place] [address] Phone: [phone] Fax: [phone]    Patient notified that their request is being sent to the clinical staff for review and that they should receive a response within 2 business days.   Please advise at Mobile [phone] (mobile)

## 2023-03-16 NOTE — Telephone Encounter (Signed)
E-scribed refills.  

## 2023-03-17 ENCOUNTER — Encounter: Payer: Self-pay | Admitting: Family Medicine

## 2023-03-30 ENCOUNTER — Other Ambulatory Visit: Payer: Self-pay | Admitting: Family Medicine

## 2023-03-30 DIAGNOSIS — I10 Essential (primary) hypertension: Secondary | ICD-10-CM

## 2023-03-30 DIAGNOSIS — E559 Vitamin D deficiency, unspecified: Secondary | ICD-10-CM

## 2023-04-05 ENCOUNTER — Other Ambulatory Visit: Payer: Self-pay | Admitting: Family Medicine

## 2023-04-05 DIAGNOSIS — E559 Vitamin D deficiency, unspecified: Secondary | ICD-10-CM

## 2023-04-05 DIAGNOSIS — I10 Essential (primary) hypertension: Secondary | ICD-10-CM

## 2023-04-06 ENCOUNTER — Other Ambulatory Visit (INDEPENDENT_AMBULATORY_CARE_PROVIDER_SITE_OTHER): Payer: BC Managed Care – PPO

## 2023-04-06 DIAGNOSIS — E559 Vitamin D deficiency, unspecified: Secondary | ICD-10-CM | POA: Diagnosis not present

## 2023-04-06 DIAGNOSIS — I10 Essential (primary) hypertension: Secondary | ICD-10-CM

## 2023-04-06 LAB — COMPREHENSIVE METABOLIC PANEL
ALT: 14 U/L (ref 0–35)
AST: 15 U/L (ref 0–37)
Albumin: 4.5 g/dL (ref 3.5–5.2)
Alkaline Phosphatase: 75 U/L (ref 39–117)
BUN: 17 mg/dL (ref 6–23)
CO2: 30 meq/L (ref 19–32)
Calcium: 9.5 mg/dL (ref 8.4–10.5)
Chloride: 103 meq/L (ref 96–112)
Creatinine, Ser: 0.71 mg/dL (ref 0.40–1.20)
GFR: 94.51 mL/min (ref >60.00)
Glucose, Bld: 108 mg/dL — ABNORMAL HIGH (ref 70–99)
Potassium: 3.5 meq/L (ref 3.5–5.1)
Sodium: 141 meq/L (ref 135–145)
Total Bilirubin: 0.7 mg/dL (ref 0.2–1.2)
Total Protein: 7 g/dL (ref 6.0–8.3)

## 2023-04-06 LAB — LIPID PANEL
Cholesterol: 131 mg/dL (ref 0–200)
HDL: 52.7 mg/dL (ref >39.00)
LDL Cholesterol: 69 mg/dL (ref 0–99)
NonHDL: 78.24
Total CHOL/HDL Ratio: 2
Triglycerides: 44 mg/dL (ref 0.0–149.0)
VLDL: 8.8 mg/dL (ref 0.0–40.0)

## 2023-04-06 LAB — VITAMIN D 25 HYDROXY (VIT D DEFICIENCY, FRACTURES): VITD: 75.27 ng/mL (ref 30.00–100.00)

## 2023-04-06 LAB — TSH: TSH: 1.17 u[IU]/mL (ref 0.35–5.50)

## 2023-04-17 ENCOUNTER — Encounter: Payer: Self-pay | Admitting: Family Medicine

## 2023-04-17 ENCOUNTER — Ambulatory Visit (INDEPENDENT_AMBULATORY_CARE_PROVIDER_SITE_OTHER): Payer: BC Managed Care – PPO | Admitting: Family Medicine

## 2023-04-17 VITALS — BP 126/70 | HR 73 | Temp 99.6°F | Ht 66.5 in | Wt 199.4 lb

## 2023-04-17 DIAGNOSIS — M4810 Ankylosing hyperostosis [Forestier], site unspecified: Secondary | ICD-10-CM

## 2023-04-17 DIAGNOSIS — K219 Gastro-esophageal reflux disease without esophagitis: Secondary | ICD-10-CM | POA: Diagnosis not present

## 2023-04-17 DIAGNOSIS — I10 Essential (primary) hypertension: Secondary | ICD-10-CM

## 2023-04-17 DIAGNOSIS — Z0000 Encounter for general adult medical examination without abnormal findings: Secondary | ICD-10-CM | POA: Diagnosis not present

## 2023-04-17 DIAGNOSIS — R21 Rash and other nonspecific skin eruption: Secondary | ICD-10-CM

## 2023-04-17 DIAGNOSIS — E559 Vitamin D deficiency, unspecified: Secondary | ICD-10-CM | POA: Diagnosis not present

## 2023-04-17 DIAGNOSIS — L299 Pruritus, unspecified: Secondary | ICD-10-CM

## 2023-04-17 DIAGNOSIS — E66811 Obesity, class 1: Secondary | ICD-10-CM

## 2023-04-17 MED ORDER — OMEPRAZOLE 40 MG PO CPDR: 40.0000 mg | DELAYED_RELEASE_CAPSULE | Freq: Every day | ORAL | 3 refills | Status: AC | PRN

## 2023-04-17 MED ORDER — LOSARTAN POTASSIUM-HCTZ 100-12.5 MG PO TABS: 1.0000 | ORAL_TABLET | Freq: Every day | ORAL | 4 refills | Status: DC

## 2023-04-17 MED ORDER — AMLODIPINE BESYLATE 10 MG PO TABS: 10.0000 mg | ORAL_TABLET | Freq: Every day | ORAL | 4 refills | Status: DC

## 2023-04-17 MED ORDER — HYDROXYZINE HCL 25 MG PO TABS: ORAL_TABLET | ORAL | 3 refills | Status: AC

## 2023-04-17 MED ORDER — IRON (FERROUS SULFATE) 325 (65 FE) MG PO TABS
325.0000 mg | ORAL_TABLET | ORAL | Status: AC
Start: 2023-04-17 — End: ?

## 2023-04-17 MED ORDER — VITAMIN D3 1.25 MG (50000 UT) PO CAPS: 1.0000 | ORAL_CAPSULE | ORAL | 4 refills | Status: DC

## 2023-04-17 NOTE — Assessment & Plan Note (Signed)
Preventative protocols reviewed and updated unless pt declined. Discussed healthy diet and lifestyle.  

## 2023-04-17 NOTE — Progress Notes (Unsigned)
Ph: [phone] Fax: [phone]   Patient ID: Carolyn [name], female    DOB: August 01, 1965, 57 y.o.   MRN: [id number]  This visit was conducted in person.  BP 126/70   Pulse 73   Temp 99.6 F (37.6 C) (Oral)   Ht 5' 6.5" (1.689 m)   Wt 199 lb 6 oz (90.4 kg)   SpO2 98%   BMI 31.70 kg/m    CC: CPE Subjective:   HPI: Carolyn [name] is a 57 y.o. female presenting on 04/17/2023 for Annual Exam (Pt provided copy [to keep] of recent cervical spine MRI.)   Recent cervical MRI showing h/o C3/4 ACDF, new L paracentral disc protrusion at C2/3 with resultant moderate spinal stenosis, mild-mod L C5, C6, C8 foraminal stenosis and multilevel anterior bridging osteophytic spurring consistent with known DISH. Dr Otelia Sergeant retired - saw neurosurgery Dr Yetta Barre. Discussing C2/3 cervical fusion.   GERD - Duexis works well but not covered by [employer]. Managing with diet and has omeprazole for PRN. Stopped NSAID due to significant GI upset.   Continued chronic  dysphagia - knows what foods to avoid - potatoes.   Continued hair loss - labwork was normal. She started using Weem (biotin) gummies and shampoo.   Preventative: COLONOSCOPY WITH PROPOFOL 03/13/2023 - TA, int hem, rpt 7 yrs - difficult airway (Armbruster, Willaim Rayas, MD)  Well woman with OBGYN Dr Francee Piccolo in [place] last 2016 - released from care. S/p hysterectomy 2011 for heavy bleeding, ovaries remain. No pelvic pain.  Mammogram - Birads1 @ [facility] 07/2020. due for rpt. Lung cancer screening - not eligible  Flu shot - declines Tdap - 10/2016  COVID vaccine - Pfizer 06/2019 x2, booster 03/2020.  Zostavax 2017 shingrix - 12/2018, 03/2019  Seat belt use discussed.  Sunscreen use discussed. No changing moles on skin. Sees Dr Scharlene Gloss office.  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 Interim medical history since our last visit reviewed. Allergies and medications reviewed and updated. Outpatient Medications Prior to Visit  Medication Sig Dispense Refill   cyclobenzaprine (FLEXERIL) 10 MG tablet TAKE 1/2 TO 1 TABLET BY MOUTH TWICE DAILY AS NEEDED FOR MUSCLE SPASMS 30 tablet 3   diphenhydrAMINE (BENADRYL) 25 MG tablet Take 25 mg by mouth as needed.     MISC NATURAL PRODUCTS PO Take by mouth daily. Weem (Biotin)     Psyllium (METAMUCIL FIBER PO) Take by mouth as needed.     scopolamine (TRANSDERM-SCOP, 1.5 MG,) 1 MG/3DAYS Place 1 patch (1.5 mg total) onto the skin every 3 (three) days. 10 patch 1   traMADol (ULTRAM) 50 MG tablet Take 1 tablet (50 mg total) by mouth 2 (two) times daily as needed for moderate pain. 20 tablet 0   amLODipine (NORVASC) 10 MG tablet TAKE 1 TABLET(10 MG) BY MOUTH DAILY 90 tablet 3   Cholecalciferol (VITAMIN D3) 1.25 MG (50000 UT) CAPS TAKE 1 CAPSULE BY MOUTH 1 TIME A WEEK 12 capsule 0   hydrOXYzine (ATARAX) 25  MG tablet TAKE 1 TABLET(25 MG) BY MOUTH THREE TIMES DAILY AS NEEDED 30 tablet 0   Iron, Ferrous Sulfate, 325 (65 Fe) MG TABS Take 325 mg by mouth every Monday, Wednesday, and Friday.     losartan-hydrochlorothiazide (HYZAAR) 100-12.5 MG tablet TAKE 1 TABLET BY MOUTH DAILY 90 tablet 0   omeprazole (PRILOSEC) 40 MG capsule Take 1 capsule (40 mg total) by mouth daily as needed. 30 capsule 3   Nutritional Supplements (LIPOTROPIC COMPLEX PO) Take by mouth in the morning, at noon, and at bedtime.     ondansetron (ZOFRAN-ODT) 4 MG disintegrating tablet Take 1 tablet (4 mg total) by mouth every 8 (eight) hours as needed for nausea or vomiting. Take 1  tablet 30 minutes before each colonoscopy prep dose, then every 6 hours as needed 4 tablet 0   No facility-administered medications prior to visit.     Per HPI unless specifically indicated in ROS section below Review of Systems  Constitutional:  Negative for activity change, appetite change, chills, fatigue, fever and unexpected weight change.  HENT:  Negative for hearing loss.   Eyes:  Negative for visual disturbance.  Respiratory:  Negative for cough, chest tightness, shortness of breath and wheezing.   Cardiovascular:  Positive for leg swelling. Negative for chest pain and palpitations.  Gastrointestinal:  Positive for diarrhea (occ worse after colonoscopy). Negative for abdominal distention, abdominal pain, blood in stool, constipation, nausea and vomiting.  Genitourinary:  Negative for difficulty urinating and hematuria.  Musculoskeletal:  Negative for arthralgias, myalgias and neck pain.  Skin:  Negative for rash.  Neurological:  Positive for headaches (occ). Negative for dizziness, seizures and syncope.  Hematological:  Negative for adenopathy. Does not bruise/bleed easily.  Psychiatric/Behavioral:  Negative for dysphoric mood. The patient is not nervous/anxious.        Dry mouth    Objective:  BP 126/70   Pulse 73   Temp 99.6 F (37.6 C) (Oral)   Ht 5' 6.5" (1.689 m)   Wt 199 lb 6 oz (90.4 kg)   SpO2 98%   BMI 31.70 kg/m   Wt Readings from Last 3 Encounters:  04/17/23 199 lb 6 oz (90.4 kg)  03/13/23 199 lb 15.3 oz (90.7 kg)  01/03/23 187 lb (84.8 kg)      Physical Exam Vitals and nursing note reviewed.  Constitutional:      Appearance: Normal appearance. She is not ill-appearing.  HENT:     Head: Normocephalic and atraumatic.     Right Ear: Tympanic membrane, ear canal and external ear normal. There is no impacted cerumen.     Left Ear: Tympanic membrane, ear canal and external ear normal. There is no impacted cerumen.     Mouth/Throat:     Mouth: Mucous  membranes are moist.     Pharynx: Oropharynx is clear. No oropharyngeal exudate or posterior oropharyngeal erythema.  Eyes:     General:        Right eye: No discharge.        Left eye: No discharge.     Extraocular Movements: Extraocular movements intact.     Conjunctiva/sclera: Conjunctivae normal.     Pupils: Pupils are equal, round, and reactive to light.  Neck:     Thyroid: No thyroid mass or thyromegaly.     Vascular: No carotid bruit.  Cardiovascular:     Rate and Rhythm: Normal rate and regular rhythm.     Pulses: Normal pulses.     Heart sounds: Normal heart  sounds. No murmur heard. Pulmonary:     Effort: Pulmonary effort is normal. No respiratory distress.     Breath sounds: Normal breath sounds. No wheezing, rhonchi or rales.  Abdominal:     General: Bowel sounds are normal. There is no distension.     Palpations: Abdomen is soft. There is no mass.     Tenderness: There is no abdominal tenderness. There is no guarding or rebound.     Hernia: No hernia is present.  Musculoskeletal:     Cervical back: Normal range of motion and neck supple. No rigidity.     Right lower leg: No edema.     Left lower leg: No edema.  Lymphadenopathy:     Cervical: No cervical adenopathy.  Skin:    General: Skin is warm and dry.     Findings: No rash.  Neurological:     General: No focal deficit present.     Mental Status: She is alert. Mental status is at baseline.  Psychiatric:        Mood and Affect: Mood normal.        Behavior: Behavior normal.       Results for orders placed or performed in visit on 04/06/23  VITAMIN D 25 Hydroxy (Vit-D Deficiency, Fractures)   Collection Time: 04/06/23  7:24 AM  Result Value Ref Range   VITD 75.27 30.00 - 100.00 ng/mL  TSH   Collection Time: 04/06/23  7:24 AM  Result Value Ref Range   TSH 1.17 0.35 - 5.50 uIU/mL  Comprehensive metabolic panel   Collection Time: 04/06/23  7:24 AM  Result Value Ref Range   Sodium 141 135 - 145 mEq/L    Potassium 3.5 3.5 - 5.1 mEq/L   Chloride 103 96 - 112 mEq/L   CO2 30 19 - 32 mEq/L   Glucose, Bld 108 (H) 70 - 99 mg/dL   BUN 17 6 - 23 mg/dL   Creatinine, Ser 6.07 0.40 - 1.20 mg/dL   Total Bilirubin 0.7 0.2 - 1.2 mg/dL   Alkaline Phosphatase 75 39 - 117 U/L   AST 15 0 - 37 U/L   ALT 14 0 - 35 U/L   Total Protein 7.0 6.0 - 8.3 g/dL   Albumin 4.5 3.5 - 5.2 g/dL   GFR 37.10 >62.69 mL/min   Calcium 9.5 8.4 - 10.5 mg/dL  Lipid panel   Collection Time: 04/06/23  7:24 AM  Result Value Ref Range   Cholesterol 131 0 - 200 mg/dL   Triglycerides 48.5 0.0 - 149.0 mg/dL   HDL 46.27 >03.50 mg/dL   VLDL 8.8 0.0 - 09.3 mg/dL   LDL Cholesterol 69 0 - 99 mg/dL   Total CHOL/HDL Ratio 2    NonHDL 78.24     Assessment & Plan:   Problem List Items Addressed This Visit     Health maintenance examination (Chronic)   Preventative protocols reviewed and updated unless pt declined. Discussed healthy diet and lifestyle.       Hypertension   Relevant Medications   amLODipine (NORVASC) 10 MG tablet   losartan-hydrochlorothiazide (HYZAAR) 100-12.5 MG tablet   GERD (gastroesophageal reflux disease) - Primary   Relevant Medications   omeprazole (PRILOSEC) 40 MG capsule   Skin rash   Relevant Medications   hydrOXYzine (ATARAX) 25 MG tablet   Vitamin D deficiency   Relevant Medications   Cholecalciferol (VITAMIN D3) 1.25 MG (50000 UT) CAPS     Meds ordered this encounter  Medications   amLODipine (  NORVASC) 10 MG tablet    Sig: Take 1 tablet (10 mg total) by mouth daily.    Dispense:  90 tablet    Refill:  4   hydrOXYzine (ATARAX) 25 MG tablet    Sig: TAKE 1 TABLET(25 MG) BY MOUTH THREE TIMES DAILY AS NEEDED    Dispense:  30 tablet    Refill:  3   losartan-hydrochlorothiazide (HYZAAR) 100-12.5 MG tablet    Sig: Take 1 tablet by mouth daily.    Dispense:  90 tablet    Refill:  4   omeprazole (PRILOSEC) 40 MG capsule    Sig: Take 1 capsule (40 mg total) by mouth daily as needed.     Dispense:  30 capsule    Refill:  3   Iron, Ferrous Sulfate, 325 (65 Fe) MG TABS    Sig: Take 325 mg by mouth once a week.   Cholecalciferol (VITAMIN D3) 1.25 MG (50000 UT) CAPS    Sig: Take 1 capsule (1.25 mg total) by mouth once a week.    Dispense:  12 capsule    Refill:  4    No orders of the defined types were placed in this encounter.   Patient Instructions  Call for mammogram.  Good to see you today Return as needed or in 1 year for next physical   Follow up plan: Return in about 1 year (around 04/16/2024) for annual exam, prior fasting for blood work.  Eustaquio Boyden, MD

## 2023-04-17 NOTE — Patient Instructions (Addendum)
Call for mammogram.  Good to see you today Return as needed or in 1 year for next physical

## 2023-04-20 DIAGNOSIS — L299 Pruritus, unspecified: Secondary | ICD-10-CM | POA: Insufficient documentation

## 2023-04-20 NOTE — Assessment & Plan Note (Signed)
Continue weekly vitamin D. Previous daily OTC replacement didn't raise levels well.

## 2023-04-20 NOTE — Assessment & Plan Note (Addendum)
Chronic, stable on prn omeprazole. Avoids NSAIDs.

## 2023-04-20 NOTE — Assessment & Plan Note (Signed)
Chronic, stable. Continue current regimen. 

## 2023-04-20 NOTE — Assessment & Plan Note (Signed)
Uses hydroxyzine PRN itch.

## 2023-04-20 NOTE — Assessment & Plan Note (Signed)
Encouraged healthy diet and lifestyle choices. 

## 2023-04-20 NOTE — Assessment & Plan Note (Signed)
Chronic period, has seen surgeon.

## 2023-04-25 ENCOUNTER — Other Ambulatory Visit (HOSPITAL_COMMUNITY)
Admission: RE | Admit: 2023-04-25 | Discharge: 2023-04-25 | Disposition: A | Discharge status: Home / Self Care | Payer: Self-pay | Source: Ambulatory Visit | Attending: Medical Genetics | Admitting: Medical Genetics

## 2023-04-25 DIAGNOSIS — Z006 Encounter for examination for normal comparison and control in clinical research program: Secondary | ICD-10-CM | POA: Insufficient documentation

## 2023-05-08 LAB — GENECONNECT MOLECULAR SCREEN: Genetic Analysis Overall Interpretation: NEGATIVE

## 2023-05-11 ENCOUNTER — Encounter: Payer: Self-pay | Admitting: Family Medicine

## 2023-05-11 MED ORDER — CYCLOBENZAPRINE HCL 10 MG PO TABS: ORAL_TABLET | ORAL | 3 refills | Status: DC

## 2023-05-18 ENCOUNTER — Other Ambulatory Visit: Payer: Self-pay | Admitting: Family Medicine

## 2023-05-18 DIAGNOSIS — I10 Essential (primary) hypertension: Secondary | ICD-10-CM

## 2023-05-19 ENCOUNTER — Other Ambulatory Visit (HOSPITAL_COMMUNITY): Payer: Self-pay | Admitting: Family Medicine

## 2023-05-19 DIAGNOSIS — Z1231 Encounter for screening mammogram for malignant neoplasm of breast: Secondary | ICD-10-CM

## 2023-05-25 ENCOUNTER — Ambulatory Visit (HOSPITAL_COMMUNITY)
Admission: RE | Admit: 2023-05-25 | Discharge: 2023-05-25 | Disposition: A | Discharge status: Home / Self Care | Payer: 59 | Source: Ambulatory Visit | Attending: Family Medicine | Admitting: Family Medicine

## 2023-05-25 ENCOUNTER — Other Ambulatory Visit (HOSPITAL_COMMUNITY)
Admission: RE | Admit: 2023-05-25 | Discharge: 2023-05-25 | Disposition: A | Discharge status: Home / Self Care | Payer: 59 | Source: Ambulatory Visit | Attending: Family Medicine | Admitting: Family Medicine

## 2023-05-25 DIAGNOSIS — Z1231 Encounter for screening mammogram for malignant neoplasm of breast: Secondary | ICD-10-CM | POA: Diagnosis present

## 2023-05-25 LAB — FERRITIN: Ferritin: 66 ng/mL (ref 11–307)

## 2023-05-25 LAB — VITAMIN D 25 HYDROXY (VIT D DEFICIENCY, FRACTURES): Vit D, 25-Hydroxy: 65.57 ng/mL (ref 30–100)

## 2023-05-25 LAB — TSH: TSH: 0.765 u[IU]/mL (ref 0.350–4.500)

## 2023-08-09 ENCOUNTER — Other Ambulatory Visit: Payer: Self-pay | Admitting: Family Medicine

## 2023-08-09 DIAGNOSIS — I10 Essential (primary) hypertension: Secondary | ICD-10-CM

## 2023-08-09 NOTE — Telephone Encounter (Signed)
 Lvmtcb. Sent mychart message

## 2023-08-09 NOTE — Telephone Encounter (Signed)
 Too soon for refill. Rx sent 04/17/23, #90/4 refills to [facility] [address]. Should have refills available.  Noticed per 04/17/23 OV notes, pt was to schedule CPE and labs around 04/16/24. However, pt was scheduled on 05/06/24 for CPE and 04/29/24 for fasting labs.   Plz r/s pt's CPE & lab visit around 04/16/2024.

## 2023-08-10 NOTE — Telephone Encounter (Signed)
 Noted.

## 2023-08-10 NOTE — Telephone Encounter (Signed)
 Spoke to pt, pt states her last cpe for 04/17/23 & she will be out of town this year for [employer]. Pt states 05/06/24 works well for her & her schedule.

## 2023-09-15 ENCOUNTER — Encounter: Payer: Self-pay | Admitting: Family Medicine

## 2023-09-15 MED ORDER — SCOPOLAMINE 1 MG/3DAYS TD PT72: 1.0000 | MEDICATED_PATCH | TRANSDERMAL | 1 refills | Status: DC

## 2023-11-11 ENCOUNTER — Other Ambulatory Visit: Payer: Self-pay | Admitting: Family Medicine

## 2024-01-23 ENCOUNTER — Encounter: Payer: Self-pay | Admitting: Family Medicine

## 2024-01-23 DIAGNOSIS — M4810 Ankylosing hyperostosis [Forestier], site unspecified: Secondary | ICD-10-CM

## 2024-01-23 NOTE — Telephone Encounter (Signed)
 Name of Medication:  Tramadol  Name of Pharmacy:  [facility] [address] Last [place] or Written Date and Quantity:  01/13/24, #20 Last Office Visit and Type:  04/17/23, CPE Next Office Visit and Type:  05/06/24, CPE Last Controlled Substance Agreement Date:  none Last UDS:  none

## 2024-01-25 MED ORDER — TRAMADOL HCL 50 MG PO TABS: 50.0000 mg | ORAL_TABLET | Freq: Two times a day (BID) | ORAL | 0 refills | Status: DC | PRN

## 2024-01-25 NOTE — Telephone Encounter (Signed)
 ERx

## 2024-03-08 ENCOUNTER — Other Ambulatory Visit: Payer: Self-pay | Admitting: Family Medicine

## 2024-03-08 NOTE — Telephone Encounter (Signed)
 Name of Medication: Flexeril  [facility]  Last Fill or Written Date and Quantity: 05/11/23 #30 3 rf  Last Office Visit and Type: CPE 04/17/23 Next Office Visit and Type: CPE 05/06/24

## 2024-04-27 ENCOUNTER — Other Ambulatory Visit: Payer: Self-pay | Admitting: Family Medicine

## 2024-04-27 DIAGNOSIS — I10 Essential (primary) hypertension: Secondary | ICD-10-CM

## 2024-04-27 DIAGNOSIS — E559 Vitamin D deficiency, unspecified: Secondary | ICD-10-CM

## 2024-04-29 ENCOUNTER — Other Ambulatory Visit (INDEPENDENT_AMBULATORY_CARE_PROVIDER_SITE_OTHER): Payer: BC Managed Care – PPO

## 2024-04-29 DIAGNOSIS — E559 Vitamin D deficiency, unspecified: Secondary | ICD-10-CM

## 2024-04-29 DIAGNOSIS — I10 Essential (primary) hypertension: Secondary | ICD-10-CM

## 2024-04-29 LAB — BASIC METABOLIC PANEL WITH GFR
BUN: 18 mg/dL (ref 6–23)
CO2: 30 meq/L (ref 19–32)
Calcium: 9.4 mg/dL (ref 8.4–10.5)
Chloride: 103 meq/L (ref 96–112)
Creatinine, Ser: 0.67 mg/dL (ref 0.40–1.20)
GFR: 96.43 mL/min
Glucose, Bld: 107 mg/dL — ABNORMAL HIGH (ref 70–99)
Potassium: 4.1 meq/L (ref 3.5–5.1)
Sodium: 140 meq/L (ref 135–145)

## 2024-04-29 LAB — VITAMIN D 25 HYDROXY (VIT D DEFICIENCY, FRACTURES): VITD: 34.17 ng/mL (ref 30.00–100.00)

## 2024-04-30 ENCOUNTER — Ambulatory Visit: Payer: Self-pay | Admitting: Family Medicine

## 2024-05-06 ENCOUNTER — Encounter: Payer: Self-pay | Admitting: Family Medicine

## 2024-05-06 ENCOUNTER — Ambulatory Visit: Payer: BC Managed Care – PPO | Admitting: Family Medicine

## 2024-05-06 VITALS — BP 136/84 | HR 60 | Temp 98.6°F | Ht 66.0 in | Wt 207.0 lb

## 2024-05-06 DIAGNOSIS — I10 Essential (primary) hypertension: Secondary | ICD-10-CM | POA: Diagnosis not present

## 2024-05-06 DIAGNOSIS — K219 Gastro-esophageal reflux disease without esophagitis: Secondary | ICD-10-CM

## 2024-05-06 DIAGNOSIS — M4810 Ankylosing hyperostosis [Forestier], site unspecified: Secondary | ICD-10-CM

## 2024-05-06 DIAGNOSIS — Z0000 Encounter for general adult medical examination without abnormal findings: Secondary | ICD-10-CM | POA: Diagnosis not present

## 2024-05-06 DIAGNOSIS — L659 Nonscarring hair loss, unspecified: Secondary | ICD-10-CM | POA: Diagnosis not present

## 2024-05-06 DIAGNOSIS — E559 Vitamin D deficiency, unspecified: Secondary | ICD-10-CM

## 2024-05-06 MED ORDER — LOSARTAN POTASSIUM-HCTZ 100-12.5 MG PO TABS: 1.0000 | ORAL_TABLET | Freq: Every day | ORAL | 3 refills | Status: AC

## 2024-05-06 MED ORDER — VITAMIN D3 50 MCG (2000 UT) PO CAPS: 2000.0000 [IU] | ORAL_CAPSULE | Freq: Every day | ORAL | Status: AC

## 2024-05-06 MED ORDER — AMLODIPINE BESYLATE 10 MG PO TABS: 10.0000 mg | ORAL_TABLET | Freq: Every day | ORAL | 3 refills | Status: AC

## 2024-05-06 MED ORDER — ACETAMINOPHEN-CODEINE 300-30 MG PO TABS: 1.0000 | ORAL_TABLET | Freq: Two times a day (BID) | ORAL | 0 refills | Status: AC | PRN

## 2024-05-06 NOTE — Patient Instructions (Addendum)
 We will request dermatology records (Dr Placido at [facility] dermatology).  I have sent tylenol  #3 with codeine  to your pharmacy to try as needed - let me know how you do with this.  Good to see you today Return as needed or in 1 year for next physical

## 2024-05-06 NOTE — Assessment & Plan Note (Signed)
 Ongoing difficulty, sees Dr Junior at [facility] dermatology Records requested today

## 2024-05-06 NOTE — Progress Notes (Signed)
 " Ph: [phone] Fax: [phone]   Patient ID: Carolyn [name], female    DOB: 14-Sep-1965, 59 y.o.   MRN: [id number]  This visit was conducted in person.  BP 136/84 (BP Location: Right Arm, Patient Position: Sitting, Cuff Size: Normal)   Pulse 60   Temp 98.6 F (37 C) (Oral)   Ht 5' 6 (1.676 m)   Wt 207 lb (93.9 kg)   SpO2 98%   BMI 33.41 kg/m    CC: CPE Subjective:   HPI: Carolyn [name] is a 59 y.o. female presenting on 05/06/2024 for Annual Exam   Recent cervical MRI showing h/o C3/4 ACDF, new L paracentral disc protrusion at C2/3 with resultant moderate spinal stenosis, mild-mod L C5, C6, C8 foraminal stenosis and multilevel anterior bridging osteophytic spurring consistent with known DISH. Dr Lucilla retired - saw neurosurgery Dr Joshua. Discussed C2/3 cervical fusion but she prefers to avoid surgery at this time.  Requests stronger pain medicine than tramadol .   GERD - Duexis  works well but not covered by insurance. Managing with diet and has omeprazole  for PRN. Stopped NSAID due to significant GI upset. No recent trouble. Continued chronic  dysphagia - knows what foods to avoid - potatoes.    Continued hair loss saw dermatology Dr Junior - labwork was normal, biopsy was normal. She started using Weem (biotin) gummies and shampoo. She started rogaine.   Notes loose stools since colonoscopy 02/2023. Using gasx for increased gas.    Preventative: COLONOSCOPY WITH PROPOFOL  03/13/2023 - TA, int hem, rpt 7 yrs - difficult airway (done in the hospital) (Armbruster, Elspeth SQUIBB, MD)  Well woman with OBGYN Dr Doyal in [place] last 2016 - released from care. S/p hysterectomy 2011 for heavy bleeding, ovaries remain. No pelvic pain.  Mammogram - Birads1 @ [facility] 04/2023. Lung cancer screening - not eligible  Flu shot - yearly Prevnar-20 - declines  Tdap - 10/2016  COVID vaccine - Pfizer 06/2019 x2, booster 03/2020.  Zostavax 2017 shingrix  - 12/2018, 03/2019  Seat belt use  discussed.  Sunscreen use discussed. No changing moles on skin. Sees Dr Milford office.  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 Interim medical history since our last visit reviewed. Allergies and medications reviewed and updated. Outpatient Medications Prior to Visit  Medication Sig Dispense Refill   cyclobenzaprine  (FLEXERIL ) 10 MG tablet TAKE 1/2 TO 1 TABLET BY MOUTH UP TO TWICE DAILY AS NEEDED FOR MUSCLE SPASMS (Patient taking differently: Take 10 mg by mouth as needed for muscle spasms. TAKE 1/2 TO 1 TABLET BY MOUTH UP TO TWICE DAILY AS NEEDED FOR MUSCLE SPASMS) 30 tablet 3   diphenhydrAMINE  (BENADRYL ) 25 MG tablet Take 25 mg by mouth as needed.     hydrOXYzine  (ATARAX ) 25 MG tablet TAKE 1 TABLET(25 MG) BY MOUTH THREE TIMES DAILY AS NEEDED (Patient taking differently: Take 25 mg by mouth as needed. TAKE  1 TABLET(25 MG) BY MOUTH THREE TIMES DAILY AS NEEDED) 30 tablet 3   Iron , Ferrous Sulfate , 325 (65 Fe) MG TABS Take 325 mg by mouth once a week.     omeprazole  (PRILOSEC) 40 MG capsule Take 1 capsule (40 mg total) by mouth daily as needed. 30 capsule 3   Psyllium (METAMUCIL FIBER PO) Take by mouth as needed.     scopolamine  (TRANSDERM-SCOP) 1 MG/3DAYS PLACE 1 PATCH ONTO THE SKIN EVERY 3 DAYS. (Patient taking differently: Place 1 patch onto the skin as needed.) 10 patch 1   amLODipine  (NORVASC ) 10 MG tablet TAKE 1 TABLET(10 MG) BY MOUTH DAILY 90  tablet 4   Cholecalciferol (VITAMIN D3) 1.25 MG (50000 UT) CAPS Take 1 capsule (1.25 mg total) by mouth once a week. 12 capsule 4   losartan -hydrochlorothiazide  (HYZAAR) 100-12.5 MG tablet Take 1 tablet by mouth daily. 90 tablet 4   traMADol  (ULTRAM ) 50 MG tablet Take 1 tablet (50 mg total) by mouth 2 (two) times daily as needed for moderate pain (pain score 4-6). 20 tablet 0   MISC NATURAL PRODUCTS PO Take by mouth daily. Weem (Biotin) (Patient not taking: Reported on 05/06/2024)     No facility-administered medications prior to visit.     Per HPI unless specifically indicated in ROS section below Review of Systems  Constitutional:  Negative for activity change, appetite change, chills, fatigue, fever and unexpected weight change.  HENT:  Negative for hearing loss.   Eyes:  Negative for visual disturbance.  Respiratory:  Negative for cough, chest tightness, shortness of breath and wheezing.   Cardiovascular:  Negative for chest pain, palpitations and leg swelling.  Gastrointestinal:  Negative for abdominal distention, abdominal pain, blood in stool, constipation, diarrhea, nausea and vomiting.       Looser stools since colonoscopy   Genitourinary:  Negative for difficulty urinating and hematuria.  Musculoskeletal:  Negative for arthralgias, myalgias and neck pain.  Skin:  Negative for rash.  Neurological:  Negative for dizziness, seizures, syncope and headaches.  Hematological:  Negative for adenopathy. Does not bruise/bleed easily.  Psychiatric/Behavioral:  Negative for dysphoric mood. The patient is not nervous/anxious.     Objective:  BP 136/84 (BP Location: Right Arm, Patient Position: Sitting, Cuff Size: Normal)   Pulse 60   Temp 98.6 F (37 C) (Oral)   Ht 5' 6 (1.676 m)   Wt 207 lb (93.9 kg)   SpO2 98%   BMI 33.41 kg/m   Wt Readings from Last 3 Encounters:  05/06/24 207 lb (93.9 kg)  04/17/23 199 lb 6 oz (90.4 kg)  03/13/23 199 lb 15.3 oz (90.7 kg)      Physical  Exam Vitals and nursing note reviewed.  Constitutional:      Appearance: Normal appearance. She is not ill-appearing.  HENT:     Head: Normocephalic and atraumatic.     Right Ear: Tympanic membrane, ear canal and external ear normal. There is no impacted cerumen.     Left Ear: Tympanic membrane, ear canal and external ear normal. There is no impacted cerumen.     Mouth/Throat:     Mouth: Mucous membranes are moist.     Pharynx: Oropharynx is clear. No oropharyngeal exudate or posterior oropharyngeal erythema.  Eyes:     General:        Right eye: No discharge.        Left eye: No discharge.     Extraocular Movements: Extraocular movements intact.     Conjunctiva/sclera: Conjunctivae  normal.     Pupils: Pupils are equal, round, and reactive to light.  Neck:     Thyroid : No thyroid  mass or thyromegaly.     Vascular: No carotid bruit.  Cardiovascular:     Rate and Rhythm: Normal rate and regular rhythm.     Pulses: Normal pulses.     Heart sounds: Normal heart sounds. No murmur heard. Pulmonary:     Effort: Pulmonary effort is normal. No respiratory distress.     Breath sounds: Normal breath sounds. No wheezing, rhonchi or rales.  Abdominal:     General: Bowel sounds are normal. There is no distension.     Palpations: Abdomen is soft. There is no mass.     Tenderness: There is no abdominal tenderness. There is no guarding or rebound.     Hernia: No hernia is present.  Musculoskeletal:     Cervical back: Normal range of motion and neck supple. No rigidity.     Right lower leg: No edema.     Left lower leg: No edema.  Lymphadenopathy:     Cervical: No cervical adenopathy.  Skin:    General: Skin is warm and dry.     Findings: No rash.  Neurological:     General: No focal deficit present.     Mental Status: She is alert. Mental status is at baseline.  Psychiatric:        Mood and Affect: Mood normal.        Behavior: Behavior normal.       Results for orders placed or  performed in visit on 04/29/24  VITAMIN D  25 Hydroxy (Vit-D Deficiency, Fractures)   Collection Time: 04/29/24  7:21 AM  Result Value Ref Range   VITD 34.17 30.00 - 100.00 ng/mL  Basic metabolic panel with GFR   Collection Time: 04/29/24  7:21 AM  Result Value Ref Range   Sodium 140 135 - 145 mEq/L   Potassium 4.1 3.5 - 5.1 mEq/L   Chloride 103 96 - 112 mEq/L   CO2 30 19 - 32 mEq/L   Glucose, Bld 107 (H) 70 - 99 mg/dL   BUN 18 6 - 23 mg/dL   Creatinine, Ser 9.32 0.40 - 1.20 mg/dL   GFR 03.56 >39.99 mL/min   Calcium 9.4 8.4 - 10.5 mg/dL    Assessment & Plan:   Problem List Items Addressed This Visit     Health maintenance examination - Primary (Chronic)   Preventative protocols reviewed and updated unless pt declined. Discussed healthy diet and lifestyle.       DISH (diffuse idiopathic skeletal hyperostosis)   Chronic pain from this managed with PRN muscle relaxant and tramadol  - requests stronger medicine for PRN to replace tramadol  - will try tylenol  #3 with codeine       Hypertension   Chronic, stable on current regimen - continue.       Relevant Medications   amLODipine  (NORVASC ) 10 MG tablet   losartan -hydrochlorothiazide  (HYZAAR) 100-12.5 MG tablet   GERD (gastroesophageal reflux disease)   Chronic ,managed with diet, and PRN omeprazole .  Avoids NSAIDs      Vitamin D  deficiency   Stable period on vit D3 OTC daily - thinks 2000 units      Hair loss   Ongoing difficulty, sees Dr Junior at [facility] dermatology Records requested today         Meds ordered this encounter  Medications   acetaminophen -codeine  (TYLENOL  #3) 300-30 MG tablet    Sig: Take 1 tablet  by mouth 2 (two) times daily as needed for moderate pain (pain score 4-6).    Dispense:  30 tablet    Refill:  0    To replace tramadol    amLODipine  (NORVASC ) 10 MG tablet    Sig: Take 1 tablet (10 mg total) by mouth daily.    Dispense:  90 tablet    Refill:  3   losartan -hydrochlorothiazide   (HYZAAR) 100-12.5 MG tablet    Sig: Take 1 tablet by mouth daily.    Dispense:  90 tablet    Refill:  3   Cholecalciferol (VITAMIN D3) 50 MCG (2000 UT) capsule    Sig: Take 1 capsule (2,000 Units total) by mouth daily.    No orders of the defined types were placed in this encounter.   Patient Instructions  We will request dermatology records (Dr Placido at [facility] dermatology).  I have sent tylenol  #3 with codeine  to your pharmacy to try as needed - let me know how you do with this.  Good to see you today Return as needed or in 1 year for next physical   Follow up plan: Return in about 1 year (around 05/06/2025) for annual exam, prior fasting for blood work.  Carolyn Blas, MD   "

## 2024-05-06 NOTE — Assessment & Plan Note (Signed)
 Chronic, stable on current regimen - continue.

## 2024-05-06 NOTE — Assessment & Plan Note (Signed)
 Chronic pain from this managed with PRN muscle relaxant and tramadol  - requests stronger medicine for PRN to replace tramadol  - will try tylenol  #3 with codeine 

## 2024-05-06 NOTE — Assessment & Plan Note (Addendum)
 Chronic ,managed with diet, and PRN omeprazole .  Avoids NSAIDs

## 2024-05-06 NOTE — Assessment & Plan Note (Signed)
 Stable period on vit D3 OTC daily - thinks 2000 units

## 2024-05-06 NOTE — Assessment & Plan Note (Signed)
 Preventative protocols reviewed and updated unless pt declined. Discussed healthy diet and lifestyle.

## 2025-05-02 ENCOUNTER — Other Ambulatory Visit

## 2025-05-09 ENCOUNTER — Encounter: Admitting: Family Medicine
